# Patient Record
Sex: Male | Born: 1950
Health system: Southern US, Community
[De-identification: ages and names within clinical notes are randomized; demographics above are authoritative.]

## PROBLEM LIST (undated history)

## (undated) DIAGNOSIS — Z8719 Personal history of other diseases of the digestive system: Secondary | ICD-10-CM

## (undated) DIAGNOSIS — E785 Hyperlipidemia, unspecified: Secondary | ICD-10-CM

## (undated) DIAGNOSIS — Z87442 Personal history of urinary calculi: Secondary | ICD-10-CM

## (undated) DIAGNOSIS — J45909 Unspecified asthma, uncomplicated: Secondary | ICD-10-CM

## (undated) DIAGNOSIS — I10 Essential (primary) hypertension: Secondary | ICD-10-CM

## (undated) DIAGNOSIS — M549 Dorsalgia, unspecified: Secondary | ICD-10-CM

## (undated) DIAGNOSIS — K5790 Diverticulosis of intestine, part unspecified, without perforation or abscess without bleeding: Secondary | ICD-10-CM

## (undated) DIAGNOSIS — Z9109 Other allergy status, other than to drugs and biological substances: Secondary | ICD-10-CM

## (undated) HISTORY — PX: COLOSTOMY: SHX63

## (undated) HISTORY — PX: HERNIA REPAIR: SHX51

## (undated) HISTORY — PX: TUMOR REMOVAL: SHX12

## (undated) HISTORY — PX: SHOULDER ARTHROSCOPY W/ SUBACROMIAL DECOMPRESSION AND DISTAL CLAVICLE EXCISION: SHX2401

## (undated) HISTORY — PX: OTHER SURGICAL HISTORY: SHX169

## (undated) HISTORY — PX: COLONOSCOPY: SHX174

## (undated) HISTORY — PX: KNEE ARTHROSCOPY: SHX127

## (undated) HISTORY — PX: KIDNEY STONE SURGERY: SHX686

## (undated) HISTORY — PX: SHOULDER ARTHROSCOPY W/ ROTATOR CUFF REPAIR: SHX2400

---

## 1972-12-04 HISTORY — PX: ACHILLES TENDON REPAIR: SUR1153

## 2009-12-11 ENCOUNTER — Encounter: Admission: RE | Admit: 2009-12-11 | Discharge: 2009-12-11 | Payer: Self-pay | Admitting: Podiatry

## 2010-12-25 ENCOUNTER — Encounter: Payer: Self-pay | Admitting: Podiatry

## 2011-07-21 ENCOUNTER — Encounter (HOSPITAL_BASED_OUTPATIENT_CLINIC_OR_DEPARTMENT_OTHER)
Admission: RE | Admit: 2011-07-21 | Discharge: 2011-07-21 | Disposition: A | Discharge status: Home / Self Care | Payer: Medicare Other | Source: Ambulatory Visit | Attending: Orthopedic Surgery | Admitting: Orthopedic Surgery

## 2011-07-21 LAB — BASIC METABOLIC PANEL
BUN: 20 mg/dL (ref 6–23)
CO2: 26 mEq/L (ref 19–32)
Calcium: 9.3 mg/dL (ref 8.4–10.5)
Chloride: 100 mEq/L (ref 96–112)
Creatinine, Ser: 0.99 mg/dL (ref 0.50–1.35)
GFR calc non Af Amer: >60 mL/min (ref >60)
Glucose, Bld: 255 mg/dL — ABNORMAL HIGH (ref 70–99)
Potassium: 4.1 mEq/L (ref 3.5–5.1)
Sodium: 137 mEq/L (ref 135–145)

## 2011-07-25 ENCOUNTER — Ambulatory Visit (HOSPITAL_BASED_OUTPATIENT_CLINIC_OR_DEPARTMENT_OTHER)
Admission: RE | Admit: 2011-07-25 | Discharge: 2011-07-26 | Disposition: A | Discharge status: Home / Self Care | Payer: Medicare Other | Source: Ambulatory Visit | Attending: Orthopedic Surgery | Admitting: Orthopedic Surgery

## 2011-07-25 DIAGNOSIS — M9420 Chondromalacia, unspecified site: Secondary | ICD-10-CM | POA: Insufficient documentation

## 2011-07-25 DIAGNOSIS — M75120 Complete rotator cuff tear or rupture of unspecified shoulder, not specified as traumatic: Secondary | ICD-10-CM | POA: Insufficient documentation

## 2011-07-25 DIAGNOSIS — E669 Obesity, unspecified: Secondary | ICD-10-CM | POA: Insufficient documentation

## 2011-07-25 DIAGNOSIS — M11819 Other specified crystal arthropathies, unspecified shoulder: Secondary | ICD-10-CM | POA: Insufficient documentation

## 2011-07-25 DIAGNOSIS — I10 Essential (primary) hypertension: Secondary | ICD-10-CM | POA: Insufficient documentation

## 2011-07-25 DIAGNOSIS — M19019 Primary osteoarthritis, unspecified shoulder: Secondary | ICD-10-CM | POA: Insufficient documentation

## 2011-07-25 DIAGNOSIS — E119 Type 2 diabetes mellitus without complications: Secondary | ICD-10-CM | POA: Insufficient documentation

## 2011-07-25 DIAGNOSIS — M25819 Other specified joint disorders, unspecified shoulder: Secondary | ICD-10-CM | POA: Insufficient documentation

## 2011-07-25 DIAGNOSIS — S43439A Superior glenoid labrum lesion of unspecified shoulder, initial encounter: Secondary | ICD-10-CM | POA: Insufficient documentation

## 2011-07-25 DIAGNOSIS — Z01812 Encounter for preprocedural laboratory examination: Secondary | ICD-10-CM | POA: Insufficient documentation

## 2011-07-25 LAB — POCT HEMOGLOBIN-HEMACUE: Hemoglobin: 14.3 g/dL (ref 13.0–17.0)

## 2011-07-25 LAB — GLUCOSE, CAPILLARY: Glucose-Capillary: 169 mg/dL — ABNORMAL HIGH (ref 70–99)

## 2011-07-26 LAB — GLUCOSE, CAPILLARY: Glucose-Capillary: 189 mg/dL — ABNORMAL HIGH (ref 70–99)

## 2011-07-27 NOTE — Op Note (Signed)
NAMEDELANDO, SATTER                  ACCOUNT NO.:  [id number]  MEDICAL RECORD NO.:  [id number]  LOCATION:                                 FACILITY:  PHYSICIAN:  Katy Fitch. Yen Wandell, M.D.      DATE OF BIRTH:  DATE OF PROCEDURE:  07/25/2011 DATE OF DISCHARGE:                              OPERATIVE REPORT   PREOPERATIVE DIAGNOSES:  MRI-documented partial articular surface tear of supraspinatus rotator cuff tendon with degenerative biceps tendon changes and severe acromioclavicular arthropathy with likely chronic stage III impingement.  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  OPERATION: 1. Examination of right shoulder under anesthesia documenting a stable     glenohumeral joint. 2. Diagnostic arthroscopy right glenohumeral joint identifying     calcific tendinopathy, calcium pyrophosphate deposition disease     arthropathy, a type 3 superior labrum anterior posterior  lesion, a     full-thickness rotator cuff tear, and unstable biceps origin. 3. Arthroscopic debridement of labrum with biceps tenotomy followed by     open biceps tenodesis and extensive debridement of synovitis,     calcific tendinopathy, and calcific deposits within hyaline     cartilage of glenoid and labrum. 4. Arthroscopic subacromial decompression with partial coracoacromial     ligament release and medial acromioplasty. 5. Arthroscopic distal clavicle resection. 6. Open repair of supraspinatus and rotator interval for degenerative     calcific tendinopathy tear. 7. Open biceps tenodesis.  SURGEON:  Katy Fitch. Sadonna Kotara, MD  ASSISTANT:  Annye Rusk, PA-C  ANESTHESIA:  General by endotracheal technique supplemented by a right plexus  block.  SUPERVISING ANESTHESIOLOGIST:  Janetta Hora. 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  He remains very active physically exercising on a regular basis and performing martial arts.  He had progressive shoulder pain.  He was referred by his primary care physician, Dr. Luiz Iron for evaluation of shoulder pain.  Clinical examination revealed signs of significant AC arthropathy, stage III impingement, possible rotator cuff tear, and possible biceps tendinopathy.  He was referred for an MRI of the shoulder which documented an extreme PASTA lesion likely full-thickness, unfavorable AC anatomy, abnormal signal in the biceps tendon, and bursitis.  We recommended to Mr. Martelle to undergo diagnostic arthroscopy, appropriate joint debridement likely biceps tenotomy or tenodesis, subacromial decompression, distal clavicle resection, and repair of the rotator cuff as our findings dictated.  Preoperatively, he was seen by Dr. Luiz Iron for screening exam.  He was noted to be now experiencing type 2 diabetes with a hemoglobin A1c of 8.1.  His other lab work was within normal limits with exception of elevated SGOT and SGPT enzymes.  He is on Crestor.  Dr. Luiz Iron is planning to adjust his medications and begin metformin for his type 2 diabetes.  He was provided preoperative clearance by Dr. Luiz Iron for surgery at this time.  Questions were invited and  answered in detail.  In the holding area, he was interviewed by Dr. Gelene Mink who recommended an ultrasound-guided plexus block.  It was placed without complication. We will proceed with diagnostic arthroscopy and appropriate repairs.  PROCEDURE:  Chosen Geske was brought to room 6 at the [facility] and placed in supine position on the operating table.  Following the induction of general anesthesia by  endotracheal technique under Dr. Thornton Dales direct supervision, Mr. Lucena was carefully positioned in a beach-chair position with the aid of a torso and head holder designed for shoulder arthroscopy.  Examination of the right shoulder under anesthesia revealed no evidence of capsular instability. There was no evidence of adhesive capsulitis.  Ancef 2 g were administered as an IV prophylactic antibiotic per weight protocol followed by routine prep of the right upper extremity forequarter with DuraPrep and draping with impervious arthroscopy drapes.  After routine surgical time-out, the scope was placed into the glenohumeral joint through a standard posterior viewing portal. Diagnostic arthroscopy revealed intact hyaline articular cartilage surfaces on the humeral head and most of the glenoid.  There was calcium pyrophosphate deposition in the anterior glenoid at 3 and 4 o'clock, considerable involvement of the labrum, calcific infiltration of the supraspinatus and infraspinatus rotator cuff tendons, and a complex PASTA degenerative tear at the watershed zone extending to the rotator interval.  The long head of the biceps had an unstable origin due to a type 3 SLAP lesion with marked undermining and a positive peel back sign.  The labrum was unstable to 2:30 anteriorly and well posteriorly to approximately 10 o'clock.  At this point, we elected to proceed with a biceps tenotomy and ultimate open tenodesis with repair of the cuff. Extensive debridement of synovitis, calcific deposits, labral fragments, and hyaline cartilage fragments were accomplished with suction shaver. There were areas of loosening cartilage on the glenoid at approximately 11 and 10 o'clock superiorly and posteriorly.  The inferior recess was inspected and found to be normal.  The superior recess had a moderate degree of synovitis that was debrided.  After tenotomy of the biceps and debridement of the labrum to a  stable margin, hemostasis was achieved and the arthroscopic clip was removed from the glenohumeral joint.  We subsequently placed a scope into the subacromial space and performed an extensive bursectomy, removed the capsule of the [facility] joint, documented the osteophytes, leveled the acromion to a type 1 morphology, released part of the coracoacromial ligament obtaining hemostasis with bipolar cautery, resected the distal centimeter of clavicle arthroscopically, followed by suction shaver cleaning of all the debris.  After hemostasis was achieved, we then proceeded to an anterior middle third deltoid splitting incision.  We identified the cuff tear visually, identified the long head of the biceps, placed a grasping suture in the long head of the biceps, completed the biceps tenotomy with removal of the proximal stump.  We elevated the necrotic supraspinatus back to a portion of the infraspinatus with a power bur, decorticated the greater tuberosity, and lowered its profile approximately 3 mm, then placed a BioComposite vented corkscrew at the articular margin, placed a reverse mattress suture of fiber tape securing the supraspinatus, followed by placement of a pair of mattress sutures for inset anatomically at the articular margin and placing the rotator cuff in an anatomic footprint, tied the two medial footprint sutures to the BioComposite anchor.  Trimmed the tails of one suture, then used the grasping suture in the long head of the biceps to close the leaflets  of the tear and with the aid of a single swivel lock laterally, inset the fiber tape neutralizing the traction on the supraspinatus and creating compression with one of the tail set from the anchor.  A very low profile repair was achieved.  A hand rasp was used to contour the lateral margin of the acromion. Hemostasis was achieved with bipolar cautery followed by repair of the deltoid split.  The skin was repaired with subcutaneous  suture of 0 and 2-0 Vicryl and intradermal 3-0 Prolene.  The scope was replaced in the joint and all clot and remnants of tendon were debrided.  The final repair was documented with digital camera images.  There were no apparent complications.  Mr. Deines tolerated the surgery and anesthesia well.  He was transferred to recovery room in a sling with stable vital signs.  We will admit him to the recovery care center for observation of his vital signs, glucose, and appropriate analgesic medication.  We will provide Ancef 1 g IV x2 more doses q. 8 hours.     Katy Fitch Markham Dumlao, M.D.     RVS/MEDQ  D:  07/25/2011  T:  07/25/2011  Job:  [id number]  cc:   Dennis Bast, MD  Electronically Signed by Josephine Igo M.D. on 07/27/2011 08:19:01 AM

## 2011-11-15 ENCOUNTER — Other Ambulatory Visit: Payer: Self-pay | Admitting: Orthopedic Surgery

## 2011-11-17 ENCOUNTER — Encounter (HOSPITAL_BASED_OUTPATIENT_CLINIC_OR_DEPARTMENT_OTHER)
Admission: RE | Disposition: A | Discharge status: Home / Self Care | Payer: Self-pay | Source: Ambulatory Visit | Attending: Orthopedic Surgery

## 2011-11-17 ENCOUNTER — Ambulatory Visit (HOSPITAL_BASED_OUTPATIENT_CLINIC_OR_DEPARTMENT_OTHER)
Admission: RE | Admit: 2011-11-17 | Discharge: 2011-11-17 | Disposition: A | Discharge status: Home / Self Care | Payer: Medicare Other | Source: Ambulatory Visit | Attending: Orthopedic Surgery | Admitting: Orthopedic Surgery

## 2011-11-17 DIAGNOSIS — E119 Type 2 diabetes mellitus without complications: Secondary | ICD-10-CM | POA: Insufficient documentation

## 2011-11-17 DIAGNOSIS — M6530 Trigger finger, unspecified finger: Secondary | ICD-10-CM | POA: Insufficient documentation

## 2011-11-17 HISTORY — PX: TRIGGER FINGER RELEASE: SHX641

## 2011-11-17 SURGERY — RELEASE, A1 PULLEY, FOR TRIGGER FINGER
Anesthesia: LOCAL | Site: Hand | Laterality: Left | Wound class: Clean

## 2011-11-17 MED ORDER — LIDOCAINE HCL (PF) 2 % IJ SOLN
INTRAMUSCULAR | Status: DC | PRN
  Administered 2011-11-17: 5.5 mL

## 2011-11-17 MED ORDER — TRAMADOL HCL 50 MG PO TABS: ORAL_TABLET | ORAL | Status: DC

## 2011-11-17 MED ORDER — TRAMADOL HCL 50 MG PO TABS: ORAL_TABLET | ORAL | Status: AC

## 2011-11-17 SURGICAL SUPPLY — 32 items
BLADE SURG 15 STRL LF DISP TIS (BLADE) ×1 IMPLANT
BLADE SURG 15 STRL SS (BLADE) ×1
BNDG ELASTIC 2 VLCR STRL LF (GAUZE/BANDAGES/DRESSINGS) ×2 IMPLANT
BNDG ESMARK 4X9 LF (GAUZE/BANDAGES/DRESSINGS) ×2 IMPLANT
BRUSH SCRUB EZ PLAIN DRY (MISCELLANEOUS) ×2 IMPLANT
CLOTH BEACON ORANGE TIMEOUT ST (SAFETY) ×2 IMPLANT
CORDS BIPOLAR (ELECTRODE) IMPLANT
COVER MAYO STAND STRL (DRAPES) ×2 IMPLANT
COVER TABLE BACK 60X90 (DRAPES) ×2 IMPLANT
CUFF TOURNIQUET SINGLE 18IN (TOURNIQUET CUFF) ×2 IMPLANT
DECANTER SPIKE VIAL GLASS SM (MISCELLANEOUS) IMPLANT
DRAPE EXTREMITY T 121X128X90 (DRAPE) ×2 IMPLANT
DRAPE SURG 17X23 STRL (DRAPES) ×2 IMPLANT
GAUZE SPONGE 4X4 12PLY STRL LF (GAUZE/BANDAGES/DRESSINGS) ×2 IMPLANT
GAUZE XEROFORM 1X8 LF (GAUZE/BANDAGES/DRESSINGS) ×2 IMPLANT
GLOVE BIO SURGEON STRL SZ 6.5 (GLOVE) ×2 IMPLANT
GLOVE BIO SURGEON STRL SZ7.5 (GLOVE) ×2 IMPLANT
GLOVE BIOGEL M STRL SZ7.5 (GLOVE) ×2 IMPLANT
GLOVE ORTHO TXT STRL SZ7.5 (GLOVE) ×2 IMPLANT
GOWN PREVENTION PLUS XLARGE (GOWN DISPOSABLE) ×2 IMPLANT
GOWN STRL REIN XL XLG (GOWN DISPOSABLE) ×4 IMPLANT
NEEDLE 27GAX1X1/2 (NEEDLE) ×2 IMPLANT
PACK BASIN DAY SURGERY FS (CUSTOM PROCEDURE TRAY) ×2 IMPLANT
PAD CAST 4YDX4 CTTN HI CHSV (CAST SUPPLIES) ×1 IMPLANT
PADDING CAST COTTON 4X4 STRL (CAST SUPPLIES) ×1
SPONGE GAUZE 4X4 12PLY (GAUZE/BANDAGES/DRESSINGS) IMPLANT
STOCKINETTE 4X48 STRL (DRAPES) ×2 IMPLANT
SUT ETHILON 5 0 PS 2 18 (SUTURE) ×2 IMPLANT
SYR CONTROL 10ML LL (SYRINGE) ×2 IMPLANT
TOWEL OR 17X24 6PK STRL BLUE (TOWEL DISPOSABLE) ×2 IMPLANT
UNDERPAD 30X30 INCONTINENT (UNDERPADS AND DIAPERS) ×2 IMPLANT
WATER STERILE IRR 1000ML POUR (IV SOLUTION) IMPLANT

## 2011-11-17 NOTE — Brief Op Note (Signed)
11/17/2011  10:32 AM  PATIENT:  [name]  60 y.o. male  PRE-OPERATIVE DIAGNOSIS:  TRIGGER FINGER LEFT LONG  POST-OPERATIVE DIAGNOSIS:  LEFT LONG TRIGGER FINGER  PROCEDURE:  Procedure(s): RELEASE TRIGGER FINGER A-1 PULLEY LEFT LONG   SURGEON:  Surgeon(s): Wyn Forster., MD  PHYSICIAN ASSISTANT:   ASSISTANTS: Mallory Shirk.A-C   ANESTHESIA:   local  EBL:     BLOOD ADMINISTERED:none  DRAINS: none   LOCAL MEDICATIONS USED:  LIDOCAINE 5.5 CC 2%  SPECIMEN:  No Specimen  DISPOSITION OF SPECIMEN:  N/A  COUNTS:  YES  TOURNIQUET:   Total Tourniquet Time Documented: Upper Arm (Left) - 7 minutes  DICTATION: .Other Dictation: Dictation Number [id number]  PLAN OF CARE: Discharge to home after PACU  PATIENT DISPOSITION:  PACU - hemodynamically stable.

## 2011-11-17 NOTE — H&P (Signed)
  MEDICARE   PATIENT:   [name]. [name]     SEEN BY:   Katy Fitch. Naaman Plummer., M.D.   OFFICE VISIT:   12.12.12                  DOB:   [birth year]  [name] returns and is making progress with his right shoulder following reconstruction of his rotator cuff 07/25/11.  [name] main problem today is locking of his left long finger. He locks in flexion.  He is tender over the A-1 pulley which is swollen.  I have advised Owen to proceed with release of the A-1 pulley under local anesthesia this month.  The surgery and aftercare were described in detail.  He will continue to work with Celyn regarding his shoulder rehab.  Questions were invited and answered in detail.     _______________________ Katy Fitch. Giordan Fordham, Montez Hageman., MD/cmf   H&P documentation: 11/17/2011  -History and Physical Reviewed  -Patient has been re-examined  -No change in the plan of care  Wyn Forster, MD

## 2011-11-17 NOTE — Progress Notes (Signed)
Patient had an unspecified skin reaction after his shoulder surgery which lasted 2 months.  Patient feels this was from the anesthesia.  After discussion with patient, I found that reaction was more localized to torso, mostly on the operative side.  Plan to list CHG as a questionable reaction and will not use on this visit pre operatively.   Discussed this with patient.

## 2011-11-17 NOTE — Op Note (Signed)
Op note dictated: 11/17/11

## 2011-11-17 NOTE — Op Note (Addendum)
NAMERALIEGH, SCOBIE NO.:  [id number]  MEDICAL RECORD NO.:  [id number]  LOCATION:                                 FACILITY:  PHYSICIAN:  Katy Fitch. Collyn Ribas, M.D.      DATE OF BIRTH:  DATE OF PROCEDURE:  11/17/2011 DATE OF DISCHARGE:                              OPERATIVE REPORT   PREOPERATIVE DIAGNOSIS:  Locking left long finger, with background type 2 diabetes.  POSTOPERATIVE DIAGNOSIS:  Locking left long finger, with background type 2 diabetes.  OPERATION:  Release of left long finger A1 pulley under local anesthesia.  This is a minor operating room procedure.  OPERATING SURGEON:  Katy Fitch. Andreas Sobolewski, MD  ASSISTANT:  Annye Rusk, PA-C  ANESTHESIA:  Lidocaine 2% field block and flexor sheath block, left long finger, 5.5 mL.  This was provided by Dr. Teressa Senter preoperatively.  INDICATIONS:  [name] is a 60 year old gentleman, recently treated for a rotator cuff tear.  During his rehabilitation, we noted that he had a left long trigger finger.  [name] was diagnosed to have type 2 diabetes in the perioperative period.  He has worked very hard on managing his diabetes. He has lost  over 35 pounds since diagnosis.  He continues to rebuild his shoulder.  We recommended that it would be in his best interest to proceed with release of the A1 pulley.  We are not providing steroid injection so as not to elevate his blood glucose.  After informed consent, he is brought to the operating room at this time.  PROCEDURE:  [name] was brought to room 2 of the [facility] and placed in supine position upon the operating table.  After informed consent and Betadine prep, 5.5 mL of 2% lidocaine were infiltrated into the path of intended incision and the flexor sheath of the left long finger.  After 10 minutes, excellent anesthesia was achieved.  The left hand and arm were then prepped with Betadine soap and solution and sterilely draped.  A pneumatic  tourniquet was applied to the proximal left brachium.  Following exsanguination of the left arm with Esmarch bandage, arterial tourniquet was inflated to 220 mmHg.  Procedure commenced with a 1 cm oblique incision directly over the palpably thickened A1 pulley.  Subcutaneous tissues were carefully divided taking care to identify the pulley and retract the neurovascular structures. The pulley was split with scalpel and scissors followed by elevation of the superficialis tendon.  Limited synovectomy was accomplished, removing fibrotic tenosynovium.  There were no masses or predicaments noted.  The wound was then repaired with mattress suture of 5-0 nylon x2.  A compressive dressing was applied with Xeroflo, sterile gauze, and an Ace wrap.  For aftercare, [name] was provided a prescription for Percocet 5 mg 1 p.o. q.4-6 h. p.r.n. pain, 20 tablets without refill.     Katy Fitch Scotti Motter, M.D.     RVS/MEDQ  D:  11/17/2011  T:  11/17/2011  Job:  [id number]

## 2011-11-20 ENCOUNTER — Encounter (HOSPITAL_BASED_OUTPATIENT_CLINIC_OR_DEPARTMENT_OTHER): Payer: Self-pay

## 2011-11-20 ENCOUNTER — Encounter (HOSPITAL_BASED_OUTPATIENT_CLINIC_OR_DEPARTMENT_OTHER): Payer: Self-pay | Admitting: Orthopedic Surgery

## 2012-03-25 ENCOUNTER — Encounter (HOSPITAL_BASED_OUTPATIENT_CLINIC_OR_DEPARTMENT_OTHER): Payer: Self-pay | Admitting: *Deleted

## 2012-03-25 ENCOUNTER — Other Ambulatory Visit: Payer: Self-pay | Admitting: Orthopedic Surgery

## 2012-03-25 NOTE — Progress Notes (Signed)
To come in for bmet 

## 2012-03-26 ENCOUNTER — Encounter (HOSPITAL_BASED_OUTPATIENT_CLINIC_OR_DEPARTMENT_OTHER)
Admission: RE | Admit: 2012-03-26 | Discharge: 2012-03-26 | Disposition: A | Discharge status: Home / Self Care | Payer: Medicare Other | Source: Ambulatory Visit | Attending: Orthopedic Surgery | Admitting: Orthopedic Surgery

## 2012-03-26 LAB — BASIC METABOLIC PANEL
CO2: 28 mEq/L (ref 19–32)
Chloride: 101 mEq/L (ref 96–112)
Glucose, Bld: 123 mg/dL — ABNORMAL HIGH (ref 70–99)
Potassium: 4 mEq/L (ref 3.5–5.1)

## 2012-03-27 MED ORDER — CHLORHEXIDINE GLUCONATE 4 % EX LIQD: 60.0000 mL | Freq: Once | CUTANEOUS | Status: DC

## 2012-03-27 NOTE — H&P (Signed)
[name] is an 61 y.o. male.   Chief Complaint: Complaining of chronic and left shoulder pain and decreased range of motion HPI: Patient is a 61 year old right-hand-dominant male who had previously undergone right shoulder arthroscopy in August of 2012 with rotator cuff repair. During his rehabilitation program for his right shoulder. He noted increasing pain in the left shoulder. Her he was concerned that he might be developing a similar rotator cuff issue in the left shoulder. Patient was sent for an MRI. This was performed on 03/18/2012. This revealed supraspinatus and infraspinatus tendinosis and moderate a.c. joint arthrosis. It was felt that the patient would need undergo left shoulder arthroscopy with subacromial decompression and distal clavicle resection.  Past Medical History  Diagnosis Date  . Environmental allergies   . Arthritis   . Diabetes mellitus   . Hypertension   . Hyperlipemia   . Diverticular disease   . Back pain     Past Surgical History  Procedure Date  . Trigger finger release 11/17/2011    Procedure: RELEASE TRIGGER FINGER/A-1 PULLEY;  Surgeon: Wyn Forster., MD;  Location: [facility];  Service: Orthopedics;  Laterality: Left;  RELEASE LEFT LONG TRIGGER FINGER  . Achilles tendon repair 1974    left  . Shoulder arthroscopy 8/12    right  . Tumor removal     rt neck/chin  . Kidney stone surgery   . Trigger finger release 12/12    lt long finger  . Knee arthroscopy     rt  . Colonoscopy     No family history on file. Social History:  reports that he quit smoking about 2 years ago. He does not have any smokeless tobacco history on file. He reports that he drinks alcohol. He reports that he does not use illicit drugs.  Allergies:  Allergies  Allergen Reactions  . Hibiclens (Chlorhexidine Gluconate) Rash    Pt. Had rash on torso after last surgery, mostly on surgical side.  Am listing CHG as potential allergy.  . Flagyl  (Metronidazole Hcl) Hives    No prescriptions prior to admission    Results for orders placed during the hospital encounter of 03/28/12 (from the past 48 hour(s))  BASIC METABOLIC PANEL     Status: Abnormal   Collection Time   03/26/12  9:00 AM      Component Value Range Comment   Sodium 139  135 - 145 (mEq/L)    Potassium 4.0  3.5 - 5.1 (mEq/L)    Chloride 101  96 - 112 (mEq/L)    CO2 28  19 - 32 (mEq/L)    Glucose, Bld 123 (*) 70 - 99 (mg/dL)    BUN 23  6 - 23 (mg/dL)    Creatinine, Ser 4.09  0.50 - 1.35 (mg/dL)    Calcium 81.1  8.4 - 10.5 (mg/dL)    GFR calc non Af Amer 63 (*) >90 (mL/min)    GFR calc Af Amer 73 (*) >90 (mL/min)     No results found.   Pertinent items are noted in HPI.  Height 5\' 6"  (1.676 m), weight 86.183 kg (190 lb).  General appearance: alert Head: Normocephalic, without obvious abnormality Neck: supple, symmetrical, trachea midline Resp: clear to auscultation bilaterally Cardio: regular rate and rhythm, S1, S2 normal, no murmur, click, rub or gallop GI: normal findings: bowel sounds normal Extremities: Examination of his left shoulder reveal positive impingement signs with pain with forward flexion and a painful arc on abduction from  90-140. He also has pain with full internal rotation behind his back and at the limits of his external rotation. Plain x-rays revealed moderate a.c. arthrosis with a type 3 acromion. Pulses: 2+ and symmetric Skin: normal Neurologic: Grossly normal   Assessment/Plan Impression: Left shoulder impingement with A/C arthrosis and RC tendinosis  Plan To the OR for left shoulder arthroscopy and SAD/DCR with RC debridement. The procedure, risks and post-op course were discussed at length and he was in agreement with the plan.  DASNOIT,Denney Shein J 03/27/2012, 4:18 PM    H&P documentation: 03/28/2012  -History and Physical Reviewed  -Patient has been re-examined  -No change in the plan of care  Wyn Forster,  MD

## 2012-03-28 ENCOUNTER — Encounter (HOSPITAL_BASED_OUTPATIENT_CLINIC_OR_DEPARTMENT_OTHER): Payer: Self-pay | Admitting: Anesthesiology

## 2012-03-28 ENCOUNTER — Ambulatory Visit (HOSPITAL_BASED_OUTPATIENT_CLINIC_OR_DEPARTMENT_OTHER): Payer: Medicare Other | Admitting: Anesthesiology

## 2012-03-28 ENCOUNTER — Encounter (HOSPITAL_BASED_OUTPATIENT_CLINIC_OR_DEPARTMENT_OTHER): Payer: Self-pay | Admitting: *Deleted

## 2012-03-28 ENCOUNTER — Ambulatory Visit (HOSPITAL_BASED_OUTPATIENT_CLINIC_OR_DEPARTMENT_OTHER)
Admission: RE | Admit: 2012-03-28 | Discharge: 2012-03-28 | Disposition: A | Discharge status: Home / Self Care | Payer: Medicare Other | Source: Ambulatory Visit | Attending: Orthopedic Surgery | Admitting: Orthopedic Surgery

## 2012-03-28 ENCOUNTER — Encounter (HOSPITAL_BASED_OUTPATIENT_CLINIC_OR_DEPARTMENT_OTHER)
Admission: RE | Disposition: A | Discharge status: Home / Self Care | Payer: Self-pay | Source: Ambulatory Visit | Attending: Orthopedic Surgery

## 2012-03-28 DIAGNOSIS — E785 Hyperlipidemia, unspecified: Secondary | ICD-10-CM | POA: Insufficient documentation

## 2012-03-28 DIAGNOSIS — M24119 Other articular cartilage disorders, unspecified shoulder: Secondary | ICD-10-CM | POA: Insufficient documentation

## 2012-03-28 DIAGNOSIS — M67919 Unspecified disorder of synovium and tendon, unspecified shoulder: Secondary | ICD-10-CM | POA: Insufficient documentation

## 2012-03-28 DIAGNOSIS — Z87891 Personal history of nicotine dependence: Secondary | ICD-10-CM | POA: Insufficient documentation

## 2012-03-28 DIAGNOSIS — E119 Type 2 diabetes mellitus without complications: Secondary | ICD-10-CM | POA: Insufficient documentation

## 2012-03-28 DIAGNOSIS — I10 Essential (primary) hypertension: Secondary | ICD-10-CM | POA: Insufficient documentation

## 2012-03-28 DIAGNOSIS — M11819 Other specified crystal arthropathies, unspecified shoulder: Secondary | ICD-10-CM | POA: Insufficient documentation

## 2012-03-28 DIAGNOSIS — M719 Bursopathy, unspecified: Secondary | ICD-10-CM | POA: Insufficient documentation

## 2012-03-28 HISTORY — DX: Other allergy status, other than to drugs and biological substances: Z91.09

## 2012-03-28 HISTORY — DX: Essential (primary) hypertension: I10

## 2012-03-28 HISTORY — DX: Dorsalgia, unspecified: M54.9

## 2012-03-28 HISTORY — DX: Diverticulosis of intestine, part unspecified, without perforation or abscess without bleeding: K57.90

## 2012-03-28 HISTORY — DX: Hyperlipidemia, unspecified: E78.5

## 2012-03-28 LAB — GLUCOSE, CAPILLARY: Glucose-Capillary: 97 mg/dL (ref 70–99)

## 2012-03-28 SURGERY — SHOULDER ARTHROSCOPY WITH DISTAL CLAVICLE RESECTION
Anesthesia: Regional | Site: Shoulder | Laterality: Left | Wound class: Clean

## 2012-03-28 MED ORDER — IBUPROFEN 600 MG PO TABS: 600.0000 mg | ORAL_TABLET | Freq: Four times a day (QID) | ORAL | Status: AC | PRN

## 2012-03-28 MED ORDER — LACTATED RINGERS IV SOLN
INTRAVENOUS | Status: DC
  Administered 2012-03-28 (×2): via INTRAVENOUS

## 2012-03-28 MED ORDER — HYDROMORPHONE HCL 4 MG PO TABS
4.0000 mg | ORAL_TABLET | Freq: Once | ORAL | Status: AC | PRN
  Administered 2012-03-28: 4 mg via ORAL

## 2012-03-28 MED ORDER — FENTANYL CITRATE 0.05 MG/ML IJ SOLN
50.0000 ug | INTRAMUSCULAR | Status: DC | PRN
  Administered 2012-03-28: 100 ug via INTRAVENOUS

## 2012-03-28 MED ORDER — POVIDONE-IODINE 7.5 % EX SOLN: Freq: Once | CUTANEOUS | Status: DC

## 2012-03-28 MED ORDER — LIDOCAINE HCL (CARDIAC) 20 MG/ML IV SOLN
INTRAVENOUS | Status: DC | PRN
  Administered 2012-03-28: 75 mg via INTRAVENOUS

## 2012-03-28 MED ORDER — DEXAMETHASONE SODIUM PHOSPHATE 4 MG/ML IJ SOLN
INTRAMUSCULAR | Status: DC | PRN
  Administered 2012-03-28: 10 mg via INTRAVENOUS

## 2012-03-28 MED ORDER — CEFAZOLIN SODIUM 1-5 GM-% IV SOLN
INTRAVENOUS | Status: DC | PRN
  Administered 2012-03-28: 2 g via INTRAVENOUS

## 2012-03-28 MED ORDER — HYDROMORPHONE HCL 2 MG PO TABS: ORAL_TABLET | ORAL | Status: AC

## 2012-03-28 MED ORDER — MIDAZOLAM HCL 2 MG/2ML IJ SOLN
1.0000 mg | INTRAMUSCULAR | Status: DC | PRN
  Administered 2012-03-28: 2 mg via INTRAVENOUS

## 2012-03-28 MED ORDER — ONDANSETRON HCL 4 MG/2ML IJ SOLN
INTRAMUSCULAR | Status: DC | PRN
  Administered 2012-03-28: 4 mg via INTRAVENOUS

## 2012-03-28 MED ORDER — ROPIVACAINE HCL 5 MG/ML IJ SOLN
INTRAMUSCULAR | Status: DC | PRN
  Administered 2012-03-28: 30 mL via EPIDURAL

## 2012-03-28 MED ORDER — BUPIVACAINE-EPINEPHRINE PF 0.5-1:200000 % IJ SOLN: INTRAMUSCULAR | Status: DC | PRN

## 2012-03-28 MED ORDER — SUCCINYLCHOLINE CHLORIDE 20 MG/ML IJ SOLN
INTRAMUSCULAR | Status: DC | PRN
  Administered 2012-03-28: 100 mg via INTRAVENOUS

## 2012-03-28 MED ORDER — PROPOFOL 10 MG/ML IV EMUL
INTRAVENOUS | Status: DC | PRN
  Administered 2012-03-28: 200 mg via INTRAVENOUS

## 2012-03-28 MED ORDER — SODIUM CHLORIDE 0.9 % IR SOLN
Status: DC | PRN
  Administered 2012-03-28: 12000 mL

## 2012-03-28 MED ORDER — DOXYCYCLINE HYCLATE 100 MG PO TABS: 100.0000 mg | ORAL_TABLET | Freq: Two times a day (BID) | ORAL | Status: AC

## 2012-03-28 MED ORDER — VANCOMYCIN HCL IN DEXTROSE 1-5 GM/200ML-% IV SOLN: 1000.0000 mg | INTRAVENOUS | Status: DC

## 2012-03-28 SURGICAL SUPPLY — 80 items
BANDAGE ADHESIVE 1X3 (GAUZE/BANDAGES/DRESSINGS) IMPLANT
BLADE AVERAGE 25X9 (BLADE) IMPLANT
BLADE CUTTER MENIS 5.5 (BLADE) ×2 IMPLANT
BLADE SURG 15 STRL LF DISP TIS (BLADE) ×2 IMPLANT
BLADE SURG 15 STRL SS (BLADE) ×2
BUR EGG 3PK/BX (BURR) IMPLANT
BUR OVAL 6.0 (BURR) ×2 IMPLANT
CANISTER OMNI JUG 16 LITER (MISCELLANEOUS) ×2 IMPLANT
CANISTER SUCTION 2500CC (MISCELLANEOUS) ×2 IMPLANT
CANNULA 5.75X7 CRYSTAL CLEAR (CANNULA) IMPLANT
CANNULA SHOULDER 7CM (CANNULA) IMPLANT
CANNULA TWIST IN 8.25X7CM (CANNULA) IMPLANT
CLEANER CAUTERY TIP 5X5 PAD (MISCELLANEOUS) IMPLANT
CLOTH BEACON ORANGE TIMEOUT ST (SAFETY) ×2 IMPLANT
CUTTER MENISCUS  4.2MM (BLADE) ×1
CUTTER MENISCUS 4.2MM (BLADE) ×1 IMPLANT
DECANTER SPIKE VIAL GLASS SM (MISCELLANEOUS) IMPLANT
DRAPE INCISE IOBAN 66X45 STRL (DRAPES) ×2 IMPLANT
DRAPE STERI 35X30 U-POUCH (DRAPES) ×2 IMPLANT
DRAPE SURG 17X23 STRL (DRAPES) IMPLANT
DRAPE U-SHAPE 47X51 STRL (DRAPES) ×2 IMPLANT
DRAPE U-SHAPE 76X120 STRL (DRAPES) ×4 IMPLANT
DRSG PAD ABDOMINAL 8X10 ST (GAUZE/BANDAGES/DRESSINGS) ×2 IMPLANT
DURAPREP 26ML APPLICATOR (WOUND CARE) ×2 IMPLANT
ELECT REM PT RETURN 9FT ADLT (ELECTROSURGICAL)
ELECTRODE REM PT RTRN 9FT ADLT (ELECTROSURGICAL) IMPLANT
GLOVE BIO SURGEON STRL SZ 6.5 (GLOVE) ×2 IMPLANT
GLOVE BIOGEL M STRL SZ7.5 (GLOVE) ×2 IMPLANT
GLOVE BIOGEL PI IND STRL 7.0 (GLOVE) ×1 IMPLANT
GLOVE BIOGEL PI IND STRL 8 (GLOVE) ×2 IMPLANT
GLOVE BIOGEL PI INDICATOR 7.0 (GLOVE) ×1
GLOVE BIOGEL PI INDICATOR 8 (GLOVE) ×2
GLOVE ORTHO TXT STRL SZ7.5 (GLOVE) ×2 IMPLANT
GOWN BRE IMP PREV XXLGXLNG (GOWN DISPOSABLE) ×2 IMPLANT
GOWN PREVENTION PLUS XLARGE (GOWN DISPOSABLE) ×2 IMPLANT
GOWN STRL REIN 2XL XLG LVL4 (GOWN DISPOSABLE) ×2 IMPLANT
NDL SUT 6 .5 CRC .975X.05 MAYO (NEEDLE) IMPLANT
NEEDLE MAYO TAPER (NEEDLE)
NEEDLE MINI RC 24MM (NEEDLE) IMPLANT
NEEDLE SCORPION (NEEDLE) IMPLANT
PACK ARTHROSCOPY DSU (CUSTOM PROCEDURE TRAY) ×2 IMPLANT
PACK BASIN DAY SURGERY FS (CUSTOM PROCEDURE TRAY) ×2 IMPLANT
PAD CLEANER CAUTERY TIP 5X5 (MISCELLANEOUS)
PASSER SUT SWANSON 36MM LOOP (INSTRUMENTS) IMPLANT
PENCIL BUTTON HOLSTER BLD 10FT (ELECTRODE) IMPLANT
SLEEVE SCD COMPRESS KNEE MED (MISCELLANEOUS) ×2 IMPLANT
SLING ARM FOAM STRAP LRG (SOFTGOODS) IMPLANT
SLING ARM FOAM STRAP MED (SOFTGOODS) IMPLANT
SLING ARM FOAM STRAP XLG (SOFTGOODS) ×2 IMPLANT
SPONGE GAUZE 4X4 12PLY (GAUZE/BANDAGES/DRESSINGS) ×2 IMPLANT
SPONGE LAP 4X18 X RAY DECT (DISPOSABLE) ×2 IMPLANT
STRIP CLOSURE SKIN 1/2X4 (GAUZE/BANDAGES/DRESSINGS) ×2 IMPLANT
SUCTION FRAZIER TIP 10 FR DISP (SUCTIONS) IMPLANT
SUT ETHIBOND 2 OS 4 DA (SUTURE) IMPLANT
SUT ETHILON 4 0 PS 2 18 (SUTURE) IMPLANT
SUT FIBERWIRE #2 38 T-5 BLUE (SUTURE)
SUT FIBERWIRE 3-0 18 TAPR NDL (SUTURE)
SUT PROLENE 1 CT (SUTURE) IMPLANT
SUT PROLENE 3 0 PS 2 (SUTURE) ×2 IMPLANT
SUT TIGER TAPE 7 IN WHITE (SUTURE) IMPLANT
SUT VIC AB 0 CT1 27 (SUTURE)
SUT VIC AB 0 CT1 27XBRD ANBCTR (SUTURE) IMPLANT
SUT VIC AB 0 SH 27 (SUTURE) IMPLANT
SUT VIC AB 2-0 SH 27 (SUTURE)
SUT VIC AB 2-0 SH 27XBRD (SUTURE) IMPLANT
SUT VIC AB 3-0 SH 27 (SUTURE)
SUT VIC AB 3-0 SH 27X BRD (SUTURE) IMPLANT
SUT VIC AB 3-0 X1 27 (SUTURE) IMPLANT
SUTURE FIBERWR #2 38 T-5 BLUE (SUTURE) IMPLANT
SUTURE FIBERWR 3-0 18 TAPR NDL (SUTURE) IMPLANT
SYR 3ML 23GX1 SAFETY (SYRINGE) IMPLANT
SYR BULB 3OZ (MISCELLANEOUS) IMPLANT
TAPE FIBER 2MM 7IN #2 BLUE (SUTURE) IMPLANT
TAPE PAPER 3X10 WHT MICROPORE (GAUZE/BANDAGES/DRESSINGS) ×2 IMPLANT
TOWEL OR 17X24 6PK STRL BLUE (TOWEL DISPOSABLE) ×2 IMPLANT
TUBE CONNECTING 20X1/4 (TUBING) ×4 IMPLANT
TUBING ARTHROSCOPY IRRIG 16FT (MISCELLANEOUS) ×2 IMPLANT
WAND STAR VAC 90 (SURGICAL WAND) ×2 IMPLANT
WATER STERILE IRR 1000ML POUR (IV SOLUTION) ×2 IMPLANT
YANKAUER SUCT BULB TIP NO VENT (SUCTIONS) IMPLANT

## 2012-03-28 NOTE — Op Note (Signed)
NAME:  [name], [name]                       ACCOUNT NO.:  MEDICAL RECORD NO.:  [id number]  LOCATION:                                 FACILITY:  PHYSICIAN:  Katy Fitch. Raheim Beutler, M.D.      DATE OF BIRTH:  DATE OF PROCEDURE:  03/28/2012 DATE OF DISCHARGE:                              OPERATIVE REPORT   PREOPERATIVE DIAGNOSES:  Clinical examination revealing possible partial- thickness rotator cuff tear and chronic stage II or III impingement of left shoulder with hypertrophic acromioclavicular joint osteoarthritis and degenerative tendon signal noted on preoperative MRI.  POSTOPERATIVE DIAGNOSES:  Extreme chondrocalcinosis of glenohumeral joint with degenerative labral changes and calcific tendinopathy/chondrocalcinosis of glenohumeral articular surfaces and approximately 40% PASTA articular side tendon avulsion/degenerative tear of supraspinatus rotator cuff tendon.  OPERATION: 1. Diagnostic arthroscopy, left shoulder. 2. Arthroscopic debridement of labrum, glenohumeral articular surfaces     and supraspinatus rotator cuff tendon. 3. Arthroscopic subacromial decompression with bursectomy,     coracoacromial ligament relaxation and acromioplasty. 4. Arthroscopic resection of the distal clavicle.  OPERATING SURGEON:  Katy Fitch. Abie Killian, M.D.  ASSISTANT:  Marveen Reeks Dasnoit, PA-C  ANESTHESIA:  General endotracheal supplemented by a left ropivacaine plexus block.  SUPERVISING ANESTHESIOLOGIST:  Achille Rich, M.D.  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 He has been under our care for bilateral shoulder symptoms for the past year.  On the right, he underwent a right rotator cuff tear, complicated by rather extreme adhesive capsulitis.  During his care, we identified that he had previously unrecognized type 2 diabetes that was not well controlled.  We have established relationship  with Mr. Lamping and the medical doctor who has helped him control his type 2 diabetes, weight loss and use of metformin.  The extensive physical therapy, his right shoulder adhesive capsulitis symptoms have gradually cleared.  Mr. Wilms continued to have pain in the left shoulder; therefore, he requested that we image the left shoulder with an MRI.  This was accomplished and revealed significant tendinopathy of the supraspinatus rotator cuff tendon and very unfavorable AC anatomy.  We advised [name] to proceed with subacromial decompression and distal clavicle resection at this time to try to preserve the integrity of his cuff.  At the age of 61, [name] is at risk with his diabetes.  He developed a ischemic/degenerative tendinopathy that may not respond to simple decompression.  He is also at risk for second adhesive capsulitis bout as his diabetes is under better control, but not ideal control.  After informed consent, he was brought to the operating at this time.  PROCEDURE:  Tramain Gershman was interviewed by Dr. Chaney Malling in the holding area preoperatively.  After detailed anesthesia informed consent, a ropivacaine interscalene block was placed without complication using ultrasound control.  Excellent anesthesia of the left forequarter and upper extremity was achieved.  [name] was then transferred to room 2, where under Dr. Chaney Malling strict supervision, general endotracheal anesthesia was induced.  He is carefully positioned in the beach chair with aid of a torso and head holder designed for  shoulder arthroscopy.  Following a routine surgical time-out, examination of shoulder under anesthesia revealed the shoulder was stable to all planes of testing.  The scope was introduced through the standard posterior viewing portal with blunt technique. Diagnostic arthroscopy revealed abundant chondrocalcinosis of the glenoid and humeral head with extensive infiltration of chondrocalcinosis  into the hyaline articular cartilage and into the degenerative labrum.  The rotator cuff had a 40% pasta-type supraspinatus degenerative tear with extensive calcification of the tendon.  An anterior port was created under direct vision and a 4.2 mm suction shaver was used to perform a thorough debridement of the glenohumeral joint, labrum tendon and decortication of the greater tuberosity deep to the pasta tear.  Given Darral' past history with this severe adhesive capsulitis and given the fact that his rotator cuff tear was not 50%, we elected not to proceed with repair of the rotator cuff at this time.  After thorough joint debridement, the scope was removed from the glenohumeral joint and placed in subacromial space.  Florid bursitis was noted.  With a combination of shaving with a 5.5 mm suction shaver and use of electrocautery, the bursa was cleared, followed by relaxation of coracoacromial ligament.  A very degenerative AC joint was documented in the digital camera.  This was resected to a type 1 morphology with a suction shaver, followed by resection of the distal cm clavicle. Hemostasis was achieved with bipolar cautery.  Extensive calcification of the bursa and ligaments of the [facility] joint was noted.  All of the dystrophic calcification was removed with the suction shaver.  Hemostasis was achieved in the subacromial space with bipolar cautery. We identified the rotator cuff did not have a full-thickness tear, nor was there any side of significant bursal-sided tear.  The arthroscopic clip was removed and the portals repaired with mattress suture of 3-0 Prolene.  Mr. Renbarger was placed in a sling, awakened from general anesthesia, and transferred to recovery room in stable signs. He will be discharged home with prescriptions for Dilaudid 2 mg 1 or 2 tablets p.o. q.4-6 hours p.r.n. pain, 30 tablets without refill, also doxycycline 100 mg p.o. b.i.d. x4 days as a prophylactic  antibiotic and Motrin 600 mg 1 p.o. q.6 hours p.r.n. pain.     Katy Fitch Kaycee Haycraft, M.D.     RVS/MEDQ  D:  03/28/2012  T:  03/28/2012  Job:  [id number]

## 2012-03-28 NOTE — Op Note (Signed)
Op note dictated [id number]

## 2012-03-28 NOTE — Anesthesia Procedure Notes (Addendum)
Anesthesia Regional Block:  Interscalene brachial plexus block  Pre-Anesthetic Checklist: ,, timeout performed, Correct Patient, Correct Site, Correct Laterality, Correct Procedure, Correct Position, site marked, Risks and benefits discussed,  Surgical consent,  Pre-op evaluation,  At surgeon's request and post-op pain management  Laterality: Left  Prep: chloraprep       Needles:  Injection technique: Single-shot  Needle Type: Echogenic Stimulator Needle     Needle Length: 5cm 5 cm Needle Gauge: 22 and 22 G    Additional Needles:  Procedures: ultrasound guided and nerve stimulator Interscalene brachial plexus block  Nerve Stimulator or Paresthesia:  Response: biceps flexion, 0.45 mA,   Additional Responses:   Narrative:  Start time: 03/28/2012 9:01 AM End time: 03/28/2012 9:23 AM Injection made incrementally with aspirations every 5 mL.  Performed by: Personally  Anesthesiologist: Dr Chaney Malling  Additional Notes: Functioning IV was confirmed and monitors were applied.  A 50mm 22ga Arrow echogenic stimulator needle was used. Sterile prep and drape,hand hygiene and sterile gloves were used.  Negative aspiration and negative test dose prior to incremental administration of local anesthetic. The patient tolerated the procedure well.  Ultrasound guidance: relevent anatomy identified, needle position confirmed, local anesthetic spread visualized around nerve(s), vascular puncture avoided.  Image printed for medical record.   Interscalene brachial plexus block Procedure Name: Intubation Date/Time: 03/28/2012 9:48 AM Performed by: Lance Coon Pre-anesthesia Checklist: Patient identified, Timeout performed, Emergency Drugs available, Suction available and Patient being monitored Patient Re-evaluated:Patient Re-evaluated prior to inductionOxygen Delivery Method: Circle system utilized Preoxygenation: Pre-oxygenation with 100% oxygen Intubation Type: IV induction Ventilation:  Mask ventilation without difficulty Laryngoscope Size: Mac and 3 Grade View: Grade II Tube type: Oral Tube size: 7.0 mm Number of attempts: 1 Airway Equipment and Method: Stylet Placement Confirmation: ETT inserted through vocal cords under direct vision,  breath sounds checked- equal and bilateral and positive ETCO2 Secured at: 20 cm Dental Injury: Teeth and Oropharynx as per pre-operative assessment

## 2012-03-28 NOTE — Transfer of Care (Signed)
Immediate Anesthesia Transfer of Care Note  Patient: [name]  Procedure(s) Performed: Procedure(s) (LRB): SHOULDER ARTHROSCOPY WITH DISTAL CLAVICLE RESECTION (Left)  Patient Location: PACU  Anesthesia Type: General  Level of Consciousness: awake  Airway & Oxygen Therapy: Patient Spontanous Breathing and Patient connected to face mask oxygen  Post-op Assessment: Report given to PACU RN and Post -op Vital signs reviewed and stable  Post vital signs: Reviewed and stable  Complications: No apparent anesthesia complications

## 2012-03-28 NOTE — Anesthesia Preprocedure Evaluation (Signed)
Anesthesia Evaluation  Patient identified by MRN, date of birth, ID band Patient awake    Reviewed: Allergy & Precautions, H&P , NPO status , Patient's Chart, lab work & pertinent test results  Airway Mallampati: II  Neck ROM: full    Dental   Pulmonary former smoker         Cardiovascular hypertension,     Neuro/Psych    GI/Hepatic   Endo/Other  Diabetes mellitus-  Renal/GU      Musculoskeletal  (+) Arthritis -,   Abdominal   Peds  Hematology   Anesthesia Other Findings   Reproductive/Obstetrics                           Anesthesia Physical Anesthesia Plan  ASA: II  Anesthesia Plan: General and Regional   Post-op Pain Management: MAC Combined w/ Regional for Post-op pain   Induction: Intravenous  Airway Management Planned: Oral ETT  Additional Equipment:   Intra-op Plan:   Post-operative Plan: Extubation in OR  Informed Consent: I have reviewed the patients History and Physical, chart, labs and discussed the procedure including the risks, benefits and alternatives for the proposed anesthesia with the patient or authorized representative who has indicated his/her understanding and acceptance.     Plan Discussed with: CRNA and Surgeon  Anesthesia Plan Comments:         Anesthesia Quick Evaluation

## 2012-03-28 NOTE — Brief Op Note (Signed)
03/28/2012  10:56 AM  PATIENT:  [name]  61 y.o. 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  PROCEDURE:  SHOULDER ARTHROSCOPY WITH DEBRIDEMENT OF CALCIFIC TENDINOPATHY AND LABRAL PATHOLOGY, SUBACROMIAL DECOMPRESSION  AND DISTAL CLAVICLE RESECTION LEFT SHOULDER  SURGEON:  Wyn Forster., MD   PHYSICIAN ASSISTANT:   ASSISTANTS: Mallory Shirk.A-C   ANESTHESIA:   general  EBL:  Total I/O In: 1000 [I.V.:1000] Out: -   BLOOD ADMINISTERED:none  DRAINS: none   LOCAL MEDICATIONS USED:  ropivicaine plexus block  SPECIMEN:  No Specimen  DISPOSITION OF SPECIMEN:  N/A  COUNTS:  YES  TOURNIQUET:  * No tourniquets in log *  DICTATION: .Other Dictation: Dictation Number [id number]  PLAN OF CARE: Discharge to home after PACU  PATIENT DISPOSITION:  PACU - hemodynamically stable.

## 2012-03-28 NOTE — Progress Notes (Signed)
Assisted Dr. Hodierne with left, ultrasound guided, interscalene  block. Side rails up, monitors on throughout procedure. See vital signs in flow sheet. Tolerated Procedure well. 

## 2012-03-28 NOTE — Anesthesia Postprocedure Evaluation (Signed)
  Anesthesia Post-op Note  Patient: [name]  Procedure(s) Performed: Procedure(s) (LRB): SHOULDER ARTHROSCOPY WITH DISTAL CLAVICLE RESECTION (Left)  Patient Location: PACU  Anesthesia Type: GA combined with regional for post-op pain  Level of Consciousness: awake, alert  and oriented  Airway and Oxygen Therapy: Patient Spontanous Breathing  Post-op Pain: none  Post-op Assessment: Post-op Vital signs reviewed, Patient's Cardiovascular Status Stable, Respiratory Function Stable, Patent Airway and No signs of Nausea or vomiting  Post-op Vital Signs: Reviewed and stable  Complications: No apparent anesthesia complications

## 2012-03-28 NOTE — Discharge Instructions (Signed)
[facility] Instructions Hand Surgery  Wound Care: Keep your hand elevated above the level of your heart.  Do not allow it to dangle  by your side.  Keep the dressing dry and do not remove it unless your doctor advises you to do so.  He will usually change it at the time of your post-op visit.  Moving your fingers is advised to stimulate circulation but will depend on the site of your surgery.  If you have a splint applied, your doctor will advise you regarding movement.  Activity: Do not drive or operate machinery today.  Rest today and then you may return to your normal activity and work as indicated by your physician.  Diet:  Drink liquids today or eat a light diet.  You may resume a regular diet tomorrow.    General expectations: Pain for two to three days. Fingers may become slightly swollen.  Call your doctor if any of the following occur: Severe pain not relieved by pain medication. Elevated temperature. Dressing soaked with blood. Inability to move fingers. White or bluish color to fingers.    Regional Anesthesia Blocks  1. Numbness or the inability to move the "blocked" extremity may last from 3-48 hours after placement. The length of time depends on the medication injected and your individual response to the medication. If the numbness is not going away after 48 hours, call your surgeon.  2. The extremity that is blocked will need to be protected until the numbness is gone and the  Strength has returned. Because you cannot feel it, you will need to take extra care to avoid injury. Because it may be weak, you may have difficulty moving it or using it. You may not know what position it is in without looking at it while the block is in effect.  3. For blocks in the legs and feet, returning to weight bearing and walking needs to be done carefully. You will need to wait until the numbness is entirely gone and the strength has returned. You should be able to move your leg and foot  normally before you try and bear weight or walk. You will need someone to be with you when you first try to ensure you do not fall and possibly risk injury.  4. Bruising and tenderness at the needle site are common side effects and will resolve in a few days.  5. Persistent numbness or new problems with movement should be communicated to the surgeon or the [facility] [id number] [facility] [phone]).     Post Anesthesia Home Care Instructions  Activity: Get plenty of rest for the remainder of the day. A responsible adult should stay with you for 24 hours following the procedure.  For the next 24 hours, DO NOT: -Drive a car -Advertising copywriter -Drink alcoholic beverages -Take any medication unless instructed by your physician -Make any legal decisions or sign important papers.  Meals: Start with liquid foods such as gelatin or soup. Progress to regular foods as tolerated. Avoid greasy, spicy, heavy foods. If nausea and/or vomiting occur, drink only clear liquids until the nausea and/or vomiting subsides. Call your physician if vomiting continues.  Special Instructions/Symptoms: Your throat may feel dry or sore from the anesthesia or the breathing tube placed in your throat during surgery. If this causes discomfort, gargle with warm salt water. The discomfort should disappear within 24 hours.    Call your surgeon if you experience:   1.  Fever over  101.0. 2.  Inability to urinate. 3.  Nausea and/or vomiting. 4.  Extreme swelling or bruising at the surgical site. 5.  Continued bleeding from the incision. 6.  Increased pain, redness or drainage from the incision. 7.  Problems related to your pain medication.

## 2012-04-01 LAB — POCT HEMOGLOBIN-HEMACUE: Hemoglobin: 13.8 g/dL (ref 13.0–17.0)

## 2015-11-16 ENCOUNTER — Emergency Department (HOSPITAL_BASED_OUTPATIENT_CLINIC_OR_DEPARTMENT_OTHER)
Admission: EM | Admit: 2015-11-16 | Discharge: 2015-11-16 | Disposition: A | Discharge status: Home / Self Care | Payer: Medicare Other | Attending: Emergency Medicine | Admitting: Emergency Medicine

## 2015-11-16 ENCOUNTER — Emergency Department (HOSPITAL_BASED_OUTPATIENT_CLINIC_OR_DEPARTMENT_OTHER): Payer: Medicare Other

## 2015-11-16 ENCOUNTER — Encounter (HOSPITAL_BASED_OUTPATIENT_CLINIC_OR_DEPARTMENT_OTHER): Payer: Self-pay | Admitting: *Deleted

## 2015-11-16 DIAGNOSIS — Z7982 Long term (current) use of aspirin: Secondary | ICD-10-CM | POA: Insufficient documentation

## 2015-11-16 DIAGNOSIS — E785 Hyperlipidemia, unspecified: Secondary | ICD-10-CM | POA: Diagnosis not present

## 2015-11-16 DIAGNOSIS — Z7984 Long term (current) use of oral hypoglycemic drugs: Secondary | ICD-10-CM | POA: Diagnosis not present

## 2015-11-16 DIAGNOSIS — I10 Essential (primary) hypertension: Secondary | ICD-10-CM | POA: Diagnosis not present

## 2015-11-16 DIAGNOSIS — K5792 Diverticulitis of intestine, part unspecified, without perforation or abscess without bleeding: Secondary | ICD-10-CM | POA: Insufficient documentation

## 2015-11-16 DIAGNOSIS — J45909 Unspecified asthma, uncomplicated: Secondary | ICD-10-CM | POA: Insufficient documentation

## 2015-11-16 DIAGNOSIS — E119 Type 2 diabetes mellitus without complications: Secondary | ICD-10-CM | POA: Insufficient documentation

## 2015-11-16 DIAGNOSIS — R1030 Lower abdominal pain, unspecified: Secondary | ICD-10-CM | POA: Diagnosis present

## 2015-11-16 DIAGNOSIS — Z79899 Other long term (current) drug therapy: Secondary | ICD-10-CM | POA: Insufficient documentation

## 2015-11-16 DIAGNOSIS — M1990 Unspecified osteoarthritis, unspecified site: Secondary | ICD-10-CM | POA: Diagnosis not present

## 2015-11-16 DIAGNOSIS — Z87891 Personal history of nicotine dependence: Secondary | ICD-10-CM | POA: Diagnosis not present

## 2015-11-16 HISTORY — DX: Unspecified asthma, uncomplicated: J45.909

## 2015-11-16 LAB — CBC WITH DIFFERENTIAL/PLATELET
BASOS PCT: 0 %
Basophils Absolute: 0.1 10*3/uL (ref 0.0–0.1)
EOS PCT: 2 %
Eosinophils Absolute: 0.2 10*3/uL (ref 0.0–0.7)
HEMATOCRIT: 44.7 % (ref 39.0–52.0)
Hemoglobin: 15 g/dL (ref 13.0–17.0)
Lymphocytes Relative: 11 %
Lymphs Abs: 1.2 10*3/uL (ref 0.7–4.0)
MCH: 31.1 pg (ref 26.0–34.0)
MCHC: 33.6 g/dL (ref 30.0–36.0)
MCV: 92.5 fL (ref 78.0–100.0)
MONO ABS: 0.5 10*3/uL (ref 0.1–1.0)
Monocytes Relative: 4 %
NEUTROS ABS: 9.4 10*3/uL — AB (ref 1.7–7.7)
NEUTROS PCT: 83 %
PLATELETS: 246 10*3/uL (ref 150–400)
RBC: 4.83 MIL/uL (ref 4.22–5.81)
RDW: 12.6 % (ref 11.5–15.5)
WBC: 11.4 10*3/uL — ABNORMAL HIGH (ref 4.0–10.5)

## 2015-11-16 LAB — BASIC METABOLIC PANEL
ANION GAP: 6 (ref 5–15)
BUN: 16 mg/dL (ref 6–20)
CALCIUM: 9.2 mg/dL (ref 8.9–10.3)
CO2: 29 mmol/L (ref 22–32)
Chloride: 102 mmol/L (ref 101–111)
Creatinine, Ser: 1 mg/dL (ref 0.61–1.24)
GLUCOSE: 122 mg/dL — AB (ref 65–99)
Potassium: 4.2 mmol/L (ref 3.5–5.1)
Sodium: 137 mmol/L (ref 135–145)

## 2015-11-16 LAB — URINALYSIS, ROUTINE W REFLEX MICROSCOPIC
Bilirubin Urine: NEGATIVE
Glucose, UA: NEGATIVE mg/dL
Hgb urine dipstick: NEGATIVE
KETONES UR: NEGATIVE mg/dL
LEUKOCYTES UA: NEGATIVE
NITRITE: NEGATIVE
PROTEIN: NEGATIVE mg/dL
Specific Gravity, Urine: 1.018 (ref 1.005–1.030)
pH: 6 (ref 5.0–8.0)

## 2015-11-16 MED ORDER — IOHEXOL 300 MG/ML  SOLN
50.0000 mL | Freq: Once | INTRAMUSCULAR | Status: AC | PRN
  Administered 2015-11-16: 50 mL via ORAL

## 2015-11-16 MED ORDER — HYDROCODONE-ACETAMINOPHEN 5-325 MG PO TABS: 1.0000 | ORAL_TABLET | Freq: Four times a day (QID) | ORAL | Status: DC | PRN

## 2015-11-16 MED ORDER — SODIUM CHLORIDE 0.9 % IV BOLUS (SEPSIS)
500.0000 mL | Freq: Once | INTRAVENOUS | Status: AC
  Administered 2015-11-16: 500 mL via INTRAVENOUS

## 2015-11-16 MED ORDER — HYDROCODONE-ACETAMINOPHEN 5-325 MG PO TABS
2.0000 | ORAL_TABLET | Freq: Once | ORAL | Status: AC
  Administered 2015-11-16: 2 via ORAL
  Filled 2015-11-16: qty 2

## 2015-11-16 MED ORDER — IOHEXOL 300 MG/ML  SOLN
100.0000 mL | Freq: Once | INTRAMUSCULAR | Status: AC | PRN
  Administered 2015-11-16: 100 mL via INTRAVENOUS

## 2015-11-16 MED ORDER — AMOXICILLIN-POT CLAVULANATE 500-125 MG PO TABS: 1.0000 | ORAL_TABLET | Freq: Three times a day (TID) | ORAL | Status: DC

## 2015-11-16 NOTE — ED Provider Notes (Signed)
CSN: [id number]     Arrival date & time 11/16/15  [id number] History   First MD Initiated Contact with Patient 11/16/15 [phone]     Chief Complaint  Patient presents with  . Abdominal Pain     (Consider location/radiation/quality/duration/timing/severity/associated sxs/prior Treatment) HPI Comments: Patient is a 64 year old male with history of type 2 diabetes and hypertension. He presents for evaluation of abdominal pain. This started 2 days ago and is located in his lower abdomen. He denies any nausea, vomiting, diarrhea, or constipation. He denies any urinary complaints. He denies any fevers or chills.   He has a history of kidney stones, however this feels different. He does report that he had a colonoscopy several years ago which was unremarkable.  Patient is a 64 y.o. male presenting with abdominal pain. The history is provided by the patient.  Abdominal Pain Pain location:  Suprapubic Pain quality: cramping   Pain radiates to:  Does not radiate Pain severity:  Moderate Onset quality:  Sudden Duration:  2 days Timing:  Constant Progression:  Worsening Chronicity:  New Relieved by:  Nothing Worsened by:  Nothing tried Ineffective treatments:  None tried   Past Medical History  Diagnosis Date  . Environmental allergies   . Arthritis   . Diabetes mellitus   . Hypertension   . Hyperlipemia   . Diverticular disease   . Back pain   . Asthma    Past Surgical History  Procedure Laterality Date  . Trigger finger release  11/17/2011    Procedure: RELEASE TRIGGER FINGER/A-1 PULLEY;  Surgeon: Wyn Forsterobert V Sypher Jr., MD;  Location: [facility];  Service: Orthopedics;  Laterality: Left;  RELEASE LEFT LONG TRIGGER FINGER  . Achilles tendon repair  1974    left  . Shoulder arthroscopy  8/12    right  . Tumor removal      rt neck/chin  . Kidney stone surgery    . Trigger finger release  12/12    lt long finger  . Knee arthroscopy      rt  . Colonoscopy     No family  history on file. Social History  Substance Use Topics  . Smoking status: Former Smoker    Quit date: 03/25/2010  . Smokeless tobacco: None  . Alcohol Use: Yes     Comment: occ    Review of Systems  Gastrointestinal: Positive for abdominal pain.  All other systems reviewed and are negative.     Allergies  Hibiclens and Flagyl  Home Medications   Prior to Admission medications   Medication Sig Start Date End Date Taking? Authorizing Provider  albuterol (PROVENTIL HFA;VENTOLIN HFA) 108 (90 BASE) MCG/ACT inhaler Inhale 2 puffs into the lungs every 6 (six) hours as needed.    Historical Provider, MD  allopurinol (ZYLOPRIM) 300 MG tablet Take 300 mg by mouth daily.    Historical Provider, MD  amLODipine (NORVASC) 5 MG tablet Take 5 mg by mouth daily.    Historical Provider, MD  aspirin 81 MG tablet Take 81 mg by mouth daily.    Historical Provider, MD  fenofibrate 160 MG tablet Take 160 mg by mouth daily.    Historical Provider, MD  metFORMIN (GLUCOPHAGE) 1000 MG tablet Take 1,000 mg by mouth 2 (two) times daily with a meal.    Historical Provider, MD  rosuvastatin (CRESTOR) 20 MG tablet Take 20 mg by mouth at bedtime.    Historical Provider, MD   BP 163/88 mmHg  Pulse 64  Temp(Src)  98.5 F (36.9 C) (Oral)  Resp 19  SpO2 99% Physical Exam  Constitutional: He is oriented to person, place, and time. He appears well-developed and well-nourished. No distress.  HENT:  Head: Normocephalic and atraumatic.  Neck: Normal range of motion. Neck supple.  Cardiovascular: Normal rate, regular rhythm and normal heart sounds.   No murmur heard. Pulmonary/Chest: Effort normal and breath sounds normal. No respiratory distress. He has no wheezes.  Abdominal: Soft. Bowel sounds are normal. He exhibits no distension and no mass. There is tenderness. There is no rebound and no guarding.  There is tenderness to palpation in the suprapubic region.   Neurological: He is alert and oriented to person,  place, and time.  Skin: Skin is warm and dry. He is not diaphoretic.  Nursing note and vitals reviewed.   ED Course  Procedures (including critical care time) Labs Review Labs Reviewed  URINALYSIS, ROUTINE W REFLEX MICROSCOPIC (NOT AT [facility])  BASIC METABOLIC PANEL  CBC WITH DIFFERENTIAL/PLATELET    Imaging Review No results found. I have personally reviewed and evaluated these images and lab results as part of my medical decision-making.   EKG Interpretation None      MDM   Final diagnoses:  None    Patient is a 64 year old male who presents with lower abdominal pain. He is tender in the suprapubic region. Workup reveals a mild white count and CT scan shows acute diverticulitis with possible early inflammatory phlegmon. This will be treated with oral antibiotics and follow-up with his primary doctor.    Geoffery Lyons, MD 11/16/15 [phone]

## 2015-11-16 NOTE — ED Notes (Signed)
Patient c/o lower middle abd pain for the past two days, no v/d but constant pain and tender to touch

## 2015-11-16 NOTE — Discharge Instructions (Signed)
Augmentin as prescribed.  Hydrocodone as prescribed as needed for pain.  Return to the emergency department if symptoms significantly worsen or change.   Diverticulitis Diverticulitis is inflammation or infection of small pouches in your colon that form when you have a condition called diverticulosis. The pouches in your colon are called diverticula. Your colon, or large intestine, is where water is absorbed and stool is formed. Complications of diverticulitis can include:  Bleeding.  Severe infection.  Severe pain.  Perforation of your colon.  Obstruction of your colon. CAUSES  Diverticulitis is caused by bacteria. Diverticulitis happens when stool becomes trapped in diverticula. This allows bacteria to grow in the diverticula, which can lead to inflammation and infection. RISK FACTORS People with diverticulosis are at risk for diverticulitis. Eating a diet that does not include enough fiber from fruits and vegetables may make diverticulitis more likely to develop. SYMPTOMS  Symptoms of diverticulitis may include:  Abdominal pain and tenderness. The pain is normally located on the left side of the abdomen, but may occur in other areas.  Fever and chills.  Bloating.  Cramping.  Nausea.  Vomiting.  Constipation.  Diarrhea.  Blood in your stool. DIAGNOSIS  Your health care provider will ask you about your medical history and do a physical exam. You may need to have tests done because many medical conditions can cause the same symptoms as diverticulitis. Tests may include:  Blood tests.  Urine tests.  Imaging tests of the abdomen, including X-rays and CT scans. When your condition is under control, your health care provider may recommend that you have a colonoscopy. A colonoscopy can show how severe your diverticula are and whether something else is causing your symptoms. TREATMENT  Most cases of diverticulitis are mild and can be treated at home. Treatment may  include:  Taking over-the-counter pain medicines.  Following a clear liquid diet.  Taking antibiotic medicines by mouth for 7-10 days. More severe cases may be treated at a hospital. Treatment may include:  Not eating or drinking.  Taking prescription pain medicine.  Receiving antibiotic medicines through an IV tube.  Receiving fluids and nutrition through an IV tube.  Surgery. HOME CARE INSTRUCTIONS   Follow your health care provider's instructions carefully.  Follow a full liquid diet or other diet as directed by your health care provider. After your symptoms improve, your health care provider may tell you to change your diet. He or she may recommend you eat a high-fiber diet. Fruits and vegetables are good sources of fiber. Fiber makes it easier to pass stool.  Take fiber supplements or probiotics as directed by your health care provider.  Only take medicines as directed by your health care provider.  Keep all your follow-up appointments. SEEK MEDICAL CARE IF:   Your pain does not improve.  You have a hard time eating food.  Your bowel movements do not return to normal. SEEK IMMEDIATE MEDICAL CARE IF:   Your pain becomes worse.  Your symptoms do not get better.  Your symptoms suddenly get worse.  You have a fever.  You have repeated vomiting.  You have bloody or black, tarry stools. MAKE SURE YOU:   Understand these instructions.  Will watch your condition.  Will get help right away if you are not doing well or get worse.   This information is not intended to replace advice given to you by your health care provider. Make sure you discuss any questions you have with your health care provider.  Document Released: 08/30/2005 Document Revised: 11/25/2013 Document Reviewed: 10/15/2013 Elsevier Interactive Patient Education [employer].

## 2016-01-02 ENCOUNTER — Encounter (HOSPITAL_BASED_OUTPATIENT_CLINIC_OR_DEPARTMENT_OTHER): Payer: Self-pay | Admitting: *Deleted

## 2016-01-02 ENCOUNTER — Emergency Department (HOSPITAL_BASED_OUTPATIENT_CLINIC_OR_DEPARTMENT_OTHER)
Admission: EM | Admit: 2016-01-02 | Discharge: 2016-01-02 | Disposition: A | Discharge status: Home / Self Care | Payer: Medicare Other | Attending: Emergency Medicine | Admitting: Emergency Medicine

## 2016-01-02 ENCOUNTER — Emergency Department (HOSPITAL_BASED_OUTPATIENT_CLINIC_OR_DEPARTMENT_OTHER): Payer: Medicare Other

## 2016-01-02 DIAGNOSIS — E785 Hyperlipidemia, unspecified: Secondary | ICD-10-CM | POA: Insufficient documentation

## 2016-01-02 DIAGNOSIS — Y998 Other external cause status: Secondary | ICD-10-CM | POA: Diagnosis not present

## 2016-01-02 DIAGNOSIS — I10 Essential (primary) hypertension: Secondary | ICD-10-CM | POA: Diagnosis not present

## 2016-01-02 DIAGNOSIS — Z7984 Long term (current) use of oral hypoglycemic drugs: Secondary | ICD-10-CM | POA: Insufficient documentation

## 2016-01-02 DIAGNOSIS — X58XXXA Exposure to other specified factors, initial encounter: Secondary | ICD-10-CM | POA: Insufficient documentation

## 2016-01-02 DIAGNOSIS — Z79899 Other long term (current) drug therapy: Secondary | ICD-10-CM | POA: Diagnosis not present

## 2016-01-02 DIAGNOSIS — Z87891 Personal history of nicotine dependence: Secondary | ICD-10-CM | POA: Insufficient documentation

## 2016-01-02 DIAGNOSIS — Y9389 Activity, other specified: Secondary | ICD-10-CM | POA: Diagnosis not present

## 2016-01-02 DIAGNOSIS — S53401A Unspecified sprain of right elbow, initial encounter: Secondary | ICD-10-CM | POA: Diagnosis not present

## 2016-01-02 DIAGNOSIS — E119 Type 2 diabetes mellitus without complications: Secondary | ICD-10-CM | POA: Insufficient documentation

## 2016-01-02 DIAGNOSIS — J45909 Unspecified asthma, uncomplicated: Secondary | ICD-10-CM | POA: Insufficient documentation

## 2016-01-02 DIAGNOSIS — S46911A Strain of unspecified muscle, fascia and tendon at shoulder and upper arm level, right arm, initial encounter: Secondary | ICD-10-CM

## 2016-01-02 DIAGNOSIS — Z8719 Personal history of other diseases of the digestive system: Secondary | ICD-10-CM | POA: Diagnosis not present

## 2016-01-02 DIAGNOSIS — Z7982 Long term (current) use of aspirin: Secondary | ICD-10-CM | POA: Insufficient documentation

## 2016-01-02 DIAGNOSIS — M1990 Unspecified osteoarthritis, unspecified site: Secondary | ICD-10-CM | POA: Insufficient documentation

## 2016-01-02 DIAGNOSIS — Y9289 Other specified places as the place of occurrence of the external cause: Secondary | ICD-10-CM | POA: Insufficient documentation

## 2016-01-02 DIAGNOSIS — S59901A Unspecified injury of right elbow, initial encounter: Secondary | ICD-10-CM | POA: Diagnosis present

## 2016-01-02 NOTE — Discharge Instructions (Signed)
It is okay to use the compressive band around her forearm for discomfort. Hold an ice pack on your elbow 4 times daily for 30 minutes at a time. Call Dr.Hudnall to schedule office visit if you do not have full range of motion of your elbow in a week. Take Tylenol as directed for pain. Start taking your Losartan again today. Get your blood pressure rechecked at your primary care doctor's office in a week. Today's blood pressure was elevated at 176/91.

## 2016-01-02 NOTE — ED Provider Notes (Signed)
CSN: [id number]     Arrival date & time 01/02/16  3086 History   First MD Initiated Contact with Patient 01/02/16 [phone]     Chief Complaint  Patient presents with  . Elbow Pain     (Consider location/radiation/quality/duration/timing/severity/associated sxs/prior Treatment) HPI Complains of right elbow pain, nonradiating pain worse with extending his elbow onset 4 days ago. He thinks it pain came after playing table tennis tonight before. No definite trauma. He is unable to fully extend his elbow however he is much improved and can extend his elbow to a much greater extent than 4 days ago. He is steadily improving with time. He denies fever. No other associated symptoms. He believes that he strained his elbow. He has treated himself with ibuprofen and ice with partial relief Past Medical History  Diagnosis Date  . Environmental allergies   . Arthritis   . Diabetes mellitus   . Hypertension   . Hyperlipemia   . Diverticular disease   . Back pain   . Asthma    Past Surgical History  Procedure Laterality Date  . Trigger finger release  11/17/2011    Procedure: RELEASE TRIGGER FINGER/A-1 PULLEY;  Surgeon: Wyn Forster., MD;  Location: [facility];  Service: Orthopedics;  Laterality: Left;  RELEASE LEFT LONG TRIGGER FINGER  . Achilles tendon repair  1974    left  . Shoulder arthroscopy  8/12    right  . Tumor removal      rt neck/chin  . Kidney stone surgery    . Trigger finger release  12/12    lt long finger  . Knee arthroscopy      rt  . Colonoscopy     No family history on file. Social History  Substance Use Topics  . Smoking status: Former Smoker    Quit date: 03/25/2010  . Smokeless tobacco: Not on file  . Alcohol Use: Yes     Comment: occ    Review of Systems  Musculoskeletal: Positive for arthralgias.  Allergic/Immunologic: Positive for immunocompromised state.       Diabetic  All other systems reviewed and are negative.     Allergies   Hibiclens and Flagyl  Home Medications   Prior to Admission medications   Medication Sig Start Date End Date Taking? Authorizing Provider  albuterol (PROVENTIL HFA;VENTOLIN HFA) 108 (90 BASE) MCG/ACT inhaler Inhale 2 puffs into the lungs every 6 (six) hours as needed.    Historical Provider, MD  allopurinol (ZYLOPRIM) 300 MG tablet Take 300 mg by mouth daily.    Historical Provider, MD  amLODipine (NORVASC) 5 MG tablet Take 5 mg by mouth daily.    Historical Provider, MD  amoxicillin-clavulanate (AUGMENTIN) 500-125 MG tablet Take 1 tablet (500 mg total) by mouth every 8 (eight) hours. 11/16/15   Geoffery Lyons, MD  aspirin 81 MG tablet Take 81 mg by mouth daily.    Historical Provider, MD  fenofibrate 160 MG tablet Take 160 mg by mouth daily.    Historical Provider, MD  HYDROcodone-acetaminophen (NORCO) 5-325 MG tablet Take 1-2 tablets by mouth every 6 (six) hours as needed. 11/16/15   Geoffery Lyons, MD  metFORMIN (GLUCOPHAGE) 1000 MG tablet Take 1,000 mg by mouth 2 (two) times daily with a meal.    Historical Provider, MD  rosuvastatin (CRESTOR) 20 MG tablet Take 20 mg by mouth at bedtime.    Historical Provider, MD   is no longer on Augmentin, no longer on metformin. Diabetes has been  well-controlled with diet and exercise. He stopped low Sartin several months ago, trying to control blood pressure with diet and exercise BP 172/86 mmHg  Pulse 56  Temp(Src) 98.1 F (36.7 C) (Oral)  Resp 14  Ht  (1.676 m)  Wt 180 lb (81.647 kg)  BMI 29.07 kg/m2  SpO2 99% Physical Exam  Constitutional: He appears well-developed and well-nourished. No distress.  HENT:  Head: Normocephalic and atraumatic.  Eyes: Pupils are equal, round, and reactive to light.  Neck: Neck supple.  Pulmonary/Chest: Effort normal.  Abdominal: He exhibits no distension.  Musculoskeletal:  Right upper extremity no redness no point tenderness no swelling. He is able to extend his elbow to approximately 170. Radial pulse  2+ grip strength 5 over 5 overall of other extremities at redness swelling or tenderness neurovascular intact  Skin: He is not diaphoretic.  Nursing note and vitals reviewed.   ED Course  Procedures (including critical care time) Labs Review Labs Reviewed - No data to display  Imaging Review No results found. I have personally reviewed and evaluated these images and lab results as part of my medical decision-making.   EKG Interpretation None     X-ray viewed by me. Results for orders placed or performed during the hospital encounter of 11/16/15  Urinalysis, Routine w reflex microscopic (not at [facility])  Result Value Ref Range   Color, Urine YELLOW YELLOW   APPearance CLEAR CLEAR   Specific Gravity, Urine 1.018 1.005 - 1.030   pH 6.0 5.0 - 8.0   Glucose, UA NEGATIVE NEGATIVE mg/dL   Hgb urine dipstick NEGATIVE NEGATIVE   Bilirubin Urine NEGATIVE NEGATIVE   Ketones, ur NEGATIVE NEGATIVE mg/dL   Protein, ur NEGATIVE NEGATIVE mg/dL   Nitrite NEGATIVE NEGATIVE   Leukocytes, UA NEGATIVE NEGATIVE  Basic metabolic panel  Result Value Ref Range   Sodium 137 135 - 145 mmol/L   Potassium 4.2 3.5 - 5.1 mmol/L   Chloride 102 101 - 111 mmol/L   CO2 29 22 - 32 mmol/L   Glucose, Bld 122 (H) 65 - 99 mg/dL   BUN 16 6 - 20 mg/dL   Creatinine, Ser 1.61 0.61 - 1.24 mg/dL   Calcium 9.2 8.9 - 09.6 mg/dL   GFR calc non Af Amer >60 >60 mL/min   GFR calc Af Amer >60 >60 mL/min   Anion gap 6 5 - 15  CBC with Differential  Result Value Ref Range   WBC 11.4 (H) 4.0 - 10.5 K/uL   RBC 4.83 4.22 - 5.81 MIL/uL   Hemoglobin 15.0 13.0 - 17.0 g/dL   HCT 04.5 40.9 - 81.1 %   MCV 92.5 78.0 - 100.0 fL   MCH 31.1 26.0 - 34.0 pg   MCHC 33.6 30.0 - 36.0 g/dL   RDW 91.4 78.2 - 95.6 %   Platelets 246 150 - 400 K/uL   Neutrophils Relative % 83 %   Neutro Abs 9.4 (H) 1.7 - 7.7 K/uL   Lymphocytes Relative 11 %   Lymphs Abs 1.2 0.7 - 4.0 K/uL   Monocytes Relative 4 %   Monocytes Absolute 0.5 0.1 - 1.0  K/uL   Eosinophils Relative 2 %   Eosinophils Absolute 0.2 0.0 - 0.7 K/uL   Basophils Relative 0 %   Basophils Absolute 0.1 0.0 - 0.1 K/uL   Dg Elbow Complete Right  01/02/2016  CLINICAL DATA:  Acute right elbow pain for 5 days, decreased range of motion. EXAM: RIGHT ELBOW - COMPLETE 3+ VIEW COMPARISON:  None. FINDINGS: Normal alignment without acute osseous finding or fracture. Minor chondrocalcinosis present within the elbow joint. Possible small effusion on the lateral view. No definite soft tissue abnormality. IMPRESSION: Degenerative changes with chondrocalcinosis and a small effusion suspected on the lateral view. No acute osseous finding or fracture. Electronically Signed   By: Judie Petit.  Shick M.D.   On: 01/02/2016 09:12    MDM  Patient's symptoms steadily improving with time. No signs of infection. There is no obvious area of elbow that requires aspiration or drainage.  instructed to Start low Sartin again. PLan Tylenol for pain. Blood pressure recheck 1 week. Ice. Referral Dr.HudnaLL if not back to normal 1 week Final diagnoses:  None   diagnosis #1 right elbow strain #2 elevated blood pressure      Doug Sou, MD 01/02/16 1007

## 2016-01-02 NOTE — ED Notes (Signed)
Presents with Rt elbow pain, onset this past wed, has tried ice and ibuprofen without relief

## 2016-05-12 ENCOUNTER — Emergency Department (HOSPITAL_BASED_OUTPATIENT_CLINIC_OR_DEPARTMENT_OTHER): Payer: Medicare Other

## 2016-05-12 ENCOUNTER — Encounter (HOSPITAL_BASED_OUTPATIENT_CLINIC_OR_DEPARTMENT_OTHER): Payer: Self-pay | Admitting: Emergency Medicine

## 2016-05-12 ENCOUNTER — Emergency Department (HOSPITAL_BASED_OUTPATIENT_CLINIC_OR_DEPARTMENT_OTHER)
Admission: EM | Admit: 2016-05-12 | Discharge: 2016-05-12 | Disposition: A | Discharge status: Home / Self Care | Payer: Medicare Other | Attending: Emergency Medicine | Admitting: Emergency Medicine

## 2016-05-12 DIAGNOSIS — Z7984 Long term (current) use of oral hypoglycemic drugs: Secondary | ICD-10-CM | POA: Insufficient documentation

## 2016-05-12 DIAGNOSIS — E785 Hyperlipidemia, unspecified: Secondary | ICD-10-CM | POA: Diagnosis not present

## 2016-05-12 DIAGNOSIS — E119 Type 2 diabetes mellitus without complications: Secondary | ICD-10-CM | POA: Insufficient documentation

## 2016-05-12 DIAGNOSIS — I10 Essential (primary) hypertension: Secondary | ICD-10-CM | POA: Diagnosis not present

## 2016-05-12 DIAGNOSIS — M1990 Unspecified osteoarthritis, unspecified site: Secondary | ICD-10-CM | POA: Insufficient documentation

## 2016-05-12 DIAGNOSIS — M25561 Pain in right knee: Secondary | ICD-10-CM

## 2016-05-12 DIAGNOSIS — Z87891 Personal history of nicotine dependence: Secondary | ICD-10-CM | POA: Insufficient documentation

## 2016-05-12 DIAGNOSIS — J45909 Unspecified asthma, uncomplicated: Secondary | ICD-10-CM | POA: Diagnosis not present

## 2016-05-12 MED ORDER — NAPROXEN 500 MG PO TABS: 500.0000 mg | ORAL_TABLET | Freq: Two times a day (BID) | ORAL | Status: DC

## 2016-05-12 MED FILL — NAPROXEN 500 MG TABLET: 500 | 15 days supply | Qty: 30 | Fill #0

## 2016-05-12 NOTE — ED Notes (Addendum)
Rt knee pain x3 days, medially. Non-traumatic. Prior hx of surgery to same knee.   Acetaminophen 1 gm last taken 1 hr ago. Improved pain from 7 --> 4/10.

## 2016-05-12 NOTE — Discharge Instructions (Signed)
Return to the ED with any concerns including increased pain, not able to bear weight, fever, redness of knee, or any other alarming symptoms

## 2016-05-12 NOTE — ED Provider Notes (Signed)
CSN: [id number]     Arrival date & time 05/12/16  0727 History   First MD Initiated Contact with Patient 05/12/16 [phone]     Chief Complaint  Patient presents with  . Knee Pain     (Consider location/radiation/quality/duration/timing/severity/associated sxs/prior Treatment) HPI  Pt presenting with pain in right knee.  He states pain started this morning while he was helping get his son ready for school.  Pain is on the inner surface of right knee, hurts worse to walk up stairs.  No traumatic injury or falls.  No swelling or redness of knee.  No fevers.  He is able to bear weight.  He has taken tylenol this morning and states the pain feels relieved. He has also placed a knee sleeve and states this helps as well.  There are no other associated systemic symptoms, there are no other alleviating or modifying factors.   Past Medical History  Diagnosis Date  . Environmental allergies   . Arthritis   . Diabetes mellitus   . Hypertension   . Hyperlipemia   . Diverticular disease   . Back pain   . Asthma    Past Surgical History  Procedure Laterality Date  . Trigger finger release  11/17/2011    Procedure: RELEASE TRIGGER FINGER/A-1 PULLEY;  Surgeon: Wyn Forster., MD;  Location: [facility];  Service: Orthopedics;  Laterality: Left;  RELEASE LEFT LONG TRIGGER FINGER  . Achilles tendon repair  1974    left  . Shoulder arthroscopy  8/12    right  . Tumor removal      rt neck/chin  . Kidney stone surgery    . Trigger finger release  12/12    lt long finger  . Knee arthroscopy      rt  . Colonoscopy     History reviewed. No pertinent family history. Social History  Substance Use Topics  . Smoking status: Former Smoker    Quit date: 03/25/2010  . Smokeless tobacco: None  . Alcohol Use: Yes     Comment: occ    Review of Systems  ROS reviewed and all otherwise negative except for mentioned in HPI    Allergies  Hibiclens and Flagyl  Home Medications    Prior to Admission medications   Medication Sig Start Date End Date Taking? Authorizing Provider  albuterol (PROVENTIL HFA;VENTOLIN HFA) 108 (90 BASE) MCG/ACT inhaler Inhale 2 puffs into the lungs every 6 (six) hours as needed.    Historical Provider, MD  allopurinol (ZYLOPRIM) 300 MG tablet Take 300 mg by mouth daily.    Historical Provider, MD  amLODipine (NORVASC) 5 MG tablet Take 5 mg by mouth daily.    Historical Provider, MD  amoxicillin-clavulanate (AUGMENTIN) 500-125 MG tablet Take 1 tablet (500 mg total) by mouth every 8 (eight) hours. 11/16/15   Geoffery Lyons, MD  aspirin 81 MG tablet Take 81 mg by mouth daily.    Historical Provider, MD  fenofibrate 160 MG tablet Take 160 mg by mouth daily.    Historical Provider, MD  HYDROcodone-acetaminophen (NORCO) 5-325 MG tablet Take 1-2 tablets by mouth every 6 (six) hours as needed. 11/16/15   Geoffery Lyons, MD  metFORMIN (GLUCOPHAGE) 1000 MG tablet Take 1,000 mg by mouth 2 (two) times daily with a meal.    Historical Provider, MD  naproxen (NAPROSYN) 500 MG tablet Take 1 tablet (500 mg total) by mouth 2 (two) times daily. 05/12/16   Jerelyn Scott, MD  rosuvastatin (CRESTOR) 20 MG  tablet Take 20 mg by mouth at bedtime.    Historical Provider, MD   BP 164/84 mmHg  Pulse 57  Temp(Src) 97 F (36.1 C) (Oral)  Resp 20  Ht 5\' 6"  (1.676 m)  Wt 185 lb (83.915 kg)  BMI 29.87 kg/m2  SpO2 99%  Vitals reviewed Physical Exam  Physical Examination: General appearance - alert, well appearing, and in no distress Mental status - alert, oriented to person, place, and time Eyes - no conjunctival injection no scleral icterus Chest - clear to auscultation, no wheezes, rales or rhonchi, symmetric air entry Neurological - alert, oriented, normal speech, sensation and strength intact in right LE Musculoskeletal - no specific joint tenderness, deformity or swelling- pt points to medial area of right knee but no point tenderness or pain on ROM, no effusion, no  overlying redness or warmth.   Extremities - peripheral pulses normal, no pedal edema, no clubbing or cyanosis Skin - normal coloration and turgor, no rashes  ED Course  Procedures (including critical care time) Labs Review Labs Reviewed - No data to display  Imaging Review Dg Knee Complete 4 Views Right  05/12/2016  CLINICAL DATA:  Medial right knee pain. EXAM: RIGHT KNEE - COMPLETE 4+ VIEW COMPARISON:  None. FINDINGS: No evidence of fracture, or dislocation. There is a small suprapatellar joint effusion. The joint spaces are preserved, however there is a prominent chondrocalcinosis within both patellofemoral compartments of the knee joint. Mild osteophytosis is also noted. Vascular calcifications are seen, soft tissues otherwise unremarkable. IMPRESSION: Prominent patellofemoral compartment chondrocalcinosis, usually associated with inflammatory arthropathy such as CPPD arthropathy. Minimal osteoarthritic changes. Calcified vascular atherosclerosis. Electronically Signed   By: Ted Mcalpineobrinka  Dimitrova M.D.   On: 05/12/2016 07:58   I have personally reviewed and evaluated these images and lab results as part of my medical decision-making.   EKG Interpretation None      MDM   Final diagnoses:  Right knee pain    Pt presenting with right knee pain.  On exam he has no bony point tenderness or significant pain on ROM.  He feels improved after taking tylenol and applying knee sleeve at home prior to arrival.  Xray shows signs c/w possible pseudogout- no effusion no overlying erythema or warmth.  Pt advised of these findings but his symptoms today are not c/w this clinically.  Given rx for naproxen.  Advised f/u with PMD.  Discharged with strict return precautions.  Pt agreeable with plan.  7:46 AM went to see patient, he is not in room  Jerelyn ScottMartha Linker, MD 05/12/16 [phone]

## 2017-05-02 ENCOUNTER — Emergency Department (HOSPITAL_BASED_OUTPATIENT_CLINIC_OR_DEPARTMENT_OTHER): Payer: Medicare Other

## 2017-05-02 ENCOUNTER — Encounter (HOSPITAL_BASED_OUTPATIENT_CLINIC_OR_DEPARTMENT_OTHER): Payer: Self-pay | Admitting: Emergency Medicine

## 2017-05-02 ENCOUNTER — Emergency Department (HOSPITAL_BASED_OUTPATIENT_CLINIC_OR_DEPARTMENT_OTHER)
Admission: EM | Admit: 2017-05-02 | Discharge: 2017-05-02 | Disposition: A | Discharge status: Home / Self Care | Payer: Medicare Other | Attending: Emergency Medicine | Admitting: Emergency Medicine

## 2017-05-02 DIAGNOSIS — I10 Essential (primary) hypertension: Secondary | ICD-10-CM | POA: Diagnosis not present

## 2017-05-02 DIAGNOSIS — Z7984 Long term (current) use of oral hypoglycemic drugs: Secondary | ICD-10-CM | POA: Insufficient documentation

## 2017-05-02 DIAGNOSIS — J45909 Unspecified asthma, uncomplicated: Secondary | ICD-10-CM | POA: Diagnosis not present

## 2017-05-02 DIAGNOSIS — E119 Type 2 diabetes mellitus without complications: Secondary | ICD-10-CM | POA: Insufficient documentation

## 2017-05-02 DIAGNOSIS — R079 Chest pain, unspecified: Secondary | ICD-10-CM | POA: Diagnosis present

## 2017-05-02 DIAGNOSIS — Z7982 Long term (current) use of aspirin: Secondary | ICD-10-CM | POA: Insufficient documentation

## 2017-05-02 DIAGNOSIS — R0789 Other chest pain: Secondary | ICD-10-CM | POA: Diagnosis not present

## 2017-05-02 DIAGNOSIS — Z79899 Other long term (current) drug therapy: Secondary | ICD-10-CM | POA: Diagnosis not present

## 2017-05-02 DIAGNOSIS — Z87891 Personal history of nicotine dependence: Secondary | ICD-10-CM | POA: Insufficient documentation

## 2017-05-02 NOTE — Discharge Instructions (Signed)
Ibuprofen 600 mg 3 times daily for the next 5 days.  Rest.  Return to the emergency department if you develop worsening pain, high fevers, to focally breathing, or other new and concerning symptoms.

## 2017-05-02 NOTE — ED Triage Notes (Signed)
Pt c/o RT rib pain x 3 wks w/ no known injury

## 2017-05-02 NOTE — ED Provider Notes (Signed)
[facility] Provider Note   CSN: [id number] Arrival date & time: 05/02/17  2130     History   Chief Complaint Chief Complaint  Patient presents with  . Chest Pain    HPI [name] is a 66 y.o. male.  Patient is a 66 year old male with history of diabetes, hypertension. He presents with complaints of pain in his right lateral ribs. This is been ongoing for the past 3 weeks. It began in the absence of any injury or trauma but does believe he may have pulled a muscle while working out. He denies any difficulty breathing, nausea, or diaphoresis.   The history is provided by the patient.  Chest Pain   This is a new problem. Episode onset: 3 weeks ago. The problem occurs constantly. The problem has not changed since onset.The pain is present in the lateral region. The pain is moderate. Pertinent negatives include no cough, no diaphoresis, no nausea and no palpitations.    Past Medical History:  Diagnosis Date  . Arthritis   . Asthma   . Back pain   . Diabetes mellitus   . Diverticular disease   . Environmental allergies   . Hyperlipemia   . Hypertension     There are no active problems to display for this patient.   Past Surgical History:  Procedure Laterality Date  . ACHILLES TENDON REPAIR  1974   left  . COLONOSCOPY    . KIDNEY STONE SURGERY    . KNEE ARTHROSCOPY     rt  . SHOULDER ARTHROSCOPY  8/12   right  . TRIGGER FINGER RELEASE  11/17/2011   Procedure: RELEASE TRIGGER FINGER/A-1 PULLEY;  Surgeon: Wyn Forster., MD;  Location: [facility];  Service: Orthopedics;  Laterality: Left;  RELEASE LEFT LONG TRIGGER FINGER  . TRIGGER FINGER RELEASE  12/12   lt long finger  . TUMOR REMOVAL     rt neck/chin       Home Medications    Prior to Admission medications   Medication Sig Start Date End Date Taking? Authorizing Provider  LISINOPRIL PO Take by mouth.   Yes [provider]  albuterol (PROVENTIL HFA;VENTOLIN HFA)  108 (90 BASE) MCG/ACT inhaler Inhale 2 puffs into the lungs every 6 (six) hours as needed.    [provider]  allopurinol (ZYLOPRIM) 300 MG tablet Take 300 mg by mouth daily.    [provider]  amLODipine (NORVASC) 5 MG tablet Take 5 mg by mouth daily.    [provider]  amoxicillin-clavulanate (AUGMENTIN) 500-125 MG tablet Take 1 tablet (500 mg total) by mouth every 8 (eight) hours. 11/16/15   Geoffery Lyons, MD  aspirin 81 MG tablet Take 81 mg by mouth daily.    [provider]  fenofibrate 160 MG tablet Take 160 mg by mouth daily.    [provider]  HYDROcodone-acetaminophen (NORCO) 5-325 MG tablet Take 1-2 tablets by mouth every 6 (six) hours as needed. 11/16/15   Geoffery Lyons, MD  metFORMIN (GLUCOPHAGE) 1000 MG tablet Take 1,000 mg by mouth 2 (two) times daily with a meal.    [provider]  naproxen (NAPROSYN) 500 MG tablet Take 1 tablet (500 mg total) by mouth 2 (two) times daily. 05/12/16   Jerelyn Scott, MD  rosuvastatin (CRESTOR) 20 MG tablet Take 20 mg by mouth at bedtime.    [provider]    Family History No family history on file.  Social History Social History  Substance Use Topics  . Smoking status: Former Smoker    Quit date: 03/25/2010  . Smokeless tobacco: Never Used  . Alcohol use Yes     Comment: occ     Allergies   Hibiclens [chlorhexidine gluconate] and Flagyl [metronidazole hcl]   Review of Systems Review of Systems  Constitutional: Negative for diaphoresis.  Respiratory: Negative for cough.   Cardiovascular: Positive for chest pain. Negative for palpitations.  Gastrointestinal: Negative for nausea.  All other systems reviewed and are negative.    Physical Exam Updated Vital Signs BP (!) 159/92 (BP Location: Left Arm)   Pulse 62   Temp 98.4 F (36.9 C) (Oral)   Resp 18   Ht 5\' 5"  (1.651 m)   Wt 83.9 kg (185 lb)   SpO2 99%   BMI 30.79 kg/m   Physical Exam  Constitutional:  He is oriented to person, place, and time. He appears well-developed and well-nourished. No distress.  HENT:  Head: Normocephalic and atraumatic.  Mouth/Throat: Oropharynx is clear and moist.  Neck: Normal range of motion. Neck supple.  Cardiovascular: Normal rate and regular rhythm.  Exam reveals no friction rub.   No murmur heard. Pulmonary/Chest: Effort normal and breath sounds normal. No respiratory distress. He has no wheezes. He has no rales. He exhibits tenderness.  There is tenderness to palpation of the right lateral ribs just below the latissimus dorsi muscle.  Abdominal: Soft. Bowel sounds are normal. He exhibits no distension. There is no tenderness.  Musculoskeletal: Normal range of motion. He exhibits no edema.  Neurological: He is alert and oriented to person, place, and time. Coordination normal.  Skin: Skin is warm and dry. He is not diaphoretic.  Nursing note and vitals reviewed.    ED Treatments / Results  Labs (all labs ordered are listed, but only abnormal results are displayed) Labs Reviewed - No data to display  EKG  EKG Interpretation None       Radiology No results found.  Procedures Procedures (including critical care time)  Medications Ordered in ED Medications - No data to display   Initial Impression / Assessment and Plan / ED Course  I have reviewed the triage vital signs and the nursing notes.  Pertinent labs & imaging results that were available during my care of the patient were reviewed by me and considered in my medical decision making (see chart for details).  Patient presents with complaints of pain in his right lateral rib area for the past 3 weeks. I suspect he likely injured himself while in the gym. His x-ray reveals no evidence for rib fracture, pneumothorax, or other obvious abnormality. His oxygen saturations are 99% and there is no tachycardia or tachypnea. I highly doubt pulmonary embolism. I will recommend anti-inflammatory  medications, rest, and follow-up as needed for any problems.  Final Clinical Impressions(s) / ED Diagnoses   Final diagnoses:  None    New Prescriptions New Prescriptions   No medications on file     Geoffery Lyonselo, Ame Heagle, MD 05/02/17 1014

## 2017-11-22 ENCOUNTER — Emergency Department (HOSPITAL_BASED_OUTPATIENT_CLINIC_OR_DEPARTMENT_OTHER): Payer: Medicare Other

## 2017-11-22 ENCOUNTER — Emergency Department (HOSPITAL_BASED_OUTPATIENT_CLINIC_OR_DEPARTMENT_OTHER)
Admission: EM | Admit: 2017-11-22 | Discharge: 2017-11-22 | Disposition: A | Discharge status: Home / Self Care | Payer: Medicare Other | Attending: Emergency Medicine | Admitting: Emergency Medicine

## 2017-11-22 ENCOUNTER — Other Ambulatory Visit: Payer: Self-pay

## 2017-11-22 ENCOUNTER — Encounter (HOSPITAL_BASED_OUTPATIENT_CLINIC_OR_DEPARTMENT_OTHER): Payer: Self-pay

## 2017-11-22 DIAGNOSIS — Z7984 Long term (current) use of oral hypoglycemic drugs: Secondary | ICD-10-CM | POA: Insufficient documentation

## 2017-11-22 DIAGNOSIS — J45909 Unspecified asthma, uncomplicated: Secondary | ICD-10-CM | POA: Diagnosis not present

## 2017-11-22 DIAGNOSIS — K5792 Diverticulitis of intestine, part unspecified, without perforation or abscess without bleeding: Secondary | ICD-10-CM | POA: Insufficient documentation

## 2017-11-22 DIAGNOSIS — Z87891 Personal history of nicotine dependence: Secondary | ICD-10-CM | POA: Diagnosis not present

## 2017-11-22 DIAGNOSIS — R109 Unspecified abdominal pain: Secondary | ICD-10-CM | POA: Diagnosis present

## 2017-11-22 DIAGNOSIS — I10 Essential (primary) hypertension: Secondary | ICD-10-CM | POA: Diagnosis not present

## 2017-11-22 DIAGNOSIS — Z79899 Other long term (current) drug therapy: Secondary | ICD-10-CM | POA: Insufficient documentation

## 2017-11-22 DIAGNOSIS — E119 Type 2 diabetes mellitus without complications: Secondary | ICD-10-CM | POA: Insufficient documentation

## 2017-11-22 LAB — CBC WITH DIFFERENTIAL/PLATELET
BASOS ABS: 0 10*3/uL (ref 0.0–0.1)
Basophils Relative: 0 %
EOS PCT: 2 %
Eosinophils Absolute: 0.3 10*3/uL (ref 0.0–0.7)
HCT: 37.2 % — ABNORMAL LOW (ref 39.0–52.0)
Hemoglobin: 12.7 g/dL — ABNORMAL LOW (ref 13.0–17.0)
LYMPHS PCT: 17 %
Lymphs Abs: 2.1 10*3/uL (ref 0.7–4.0)
MCH: 32.1 pg (ref 26.0–34.0)
MCHC: 34.1 g/dL (ref 30.0–36.0)
MCV: 93.9 fL (ref 78.0–100.0)
Monocytes Absolute: 1.1 10*3/uL — ABNORMAL HIGH (ref 0.1–1.0)
Monocytes Relative: 9 %
NEUTROS ABS: 8.7 10*3/uL — AB (ref 1.7–7.7)
NEUTROS PCT: 72 %
PLATELETS: 333 10*3/uL (ref 150–400)
RBC: 3.96 MIL/uL — AB (ref 4.22–5.81)
RDW: 12.2 % (ref 11.5–15.5)
WBC: 12.2 10*3/uL — AB (ref 4.0–10.5)

## 2017-11-22 LAB — LIPASE, BLOOD: LIPASE: 38 U/L (ref 11–51)

## 2017-11-22 LAB — URINALYSIS, ROUTINE W REFLEX MICROSCOPIC
Bilirubin Urine: NEGATIVE
GLUCOSE, UA: NEGATIVE mg/dL
Hgb urine dipstick: NEGATIVE
Ketones, ur: NEGATIVE mg/dL
LEUKOCYTES UA: NEGATIVE
Nitrite: NEGATIVE
PROTEIN: NEGATIVE mg/dL
Specific Gravity, Urine: 1.02 (ref 1.005–1.030)
pH: 6.5 (ref 5.0–8.0)

## 2017-11-22 LAB — COMPREHENSIVE METABOLIC PANEL
ALT: 20 U/L (ref 17–63)
ANION GAP: 8 (ref 5–15)
AST: 20 U/L (ref 15–41)
Albumin: 4.2 g/dL (ref 3.5–5.0)
Alkaline Phosphatase: 38 U/L (ref 38–126)
BILIRUBIN TOTAL: 1 mg/dL (ref 0.3–1.2)
BUN: 28 mg/dL — AB (ref 6–20)
CO2: 24 mmol/L (ref 22–32)
Calcium: 9.3 mg/dL (ref 8.9–10.3)
Chloride: 104 mmol/L (ref 101–111)
Creatinine, Ser: 1.39 mg/dL — ABNORMAL HIGH (ref 0.61–1.24)
GFR, EST NON AFRICAN AMERICAN: 52 mL/min — AB (ref >60)
Glucose, Bld: 128 mg/dL — ABNORMAL HIGH (ref 65–99)
POTASSIUM: 4.1 mmol/L (ref 3.5–5.1)
Sodium: 136 mmol/L (ref 135–145)
TOTAL PROTEIN: 7.1 g/dL (ref 6.5–8.1)

## 2017-11-22 MED ORDER — CIPROFLOXACIN HCL 500 MG PO TABS
500.0000 mg | ORAL_TABLET | Freq: Once | ORAL | Status: DC
  Filled 2017-11-22: qty 1

## 2017-11-22 MED ORDER — METRONIDAZOLE 500 MG PO TABS
500.0000 mg | ORAL_TABLET | Freq: Once | ORAL | Status: DC
  Filled 2017-11-22: qty 1

## 2017-11-22 MED ORDER — AMOXICILLIN-POT CLAVULANATE 875-125 MG PO TABS
1.0000 | ORAL_TABLET | Freq: Once | ORAL | Status: AC
  Administered 2017-11-22: 1 via ORAL
  Filled 2017-11-22: qty 1

## 2017-11-22 MED ORDER — IOPAMIDOL (ISOVUE-300) INJECTION 61%
100.0000 mL | Freq: Once | INTRAVENOUS | Status: AC | PRN
  Administered 2017-11-22: 100 mL via INTRAVENOUS

## 2017-11-22 MED ORDER — AMOXICILLIN-POT CLAVULANATE 875-125 MG PO TABS: 1.0000 | ORAL_TABLET | Freq: Two times a day (BID) | ORAL | 0 refills | Status: DC

## 2017-11-22 MED FILL — AMOX-CLAV 875-125 MG TABLET: 875-125 | 7 days supply | Qty: 14 | Fill #0

## 2017-11-22 NOTE — ED Provider Notes (Signed)
[facility] EMERGENCY DEPARTMENT Provider Note   CSN: [id number] Arrival date & time: 11/22/17  1156     History   Chief Complaint Chief Complaint  Patient presents with  . Abdominal Pain    HPI [name] is a 66 y.o. male.  The history is provided by the patient. No language interpreter was used.  Abdominal Pain     [name] is a 66 y.o. male who presents to the Emergency Department complaining of abdominal pain.  He describes several days of lower abdominal pain described as his prior diverticulitis.  Pain is constant in nature.  Pain is worse with meals and he has mild associated nausea.  No fevers, vomiting, dysuria.  He does report mild associated constipation.  Symptoms are moderate, constant, worsening. Past Medical History:  Diagnosis Date  . Arthritis   . Asthma   . Back pain   . Diabetes mellitus   . Diverticular disease   . Environmental allergies   . Hyperlipemia   . Hypertension     There are no active problems to display for this patient.   Past Surgical History:  Procedure Laterality Date  . ACHILLES TENDON REPAIR  1974   left  . COLONOSCOPY    . KIDNEY STONE SURGERY    . KNEE ARTHROSCOPY     rt  . SHOULDER ARTHROSCOPY  8/12   right  . TRIGGER FINGER RELEASE  11/17/2011   Procedure: RELEASE TRIGGER FINGER/A-1 PULLEY;  Surgeon: Wyn Forster., MD;  Location: [facility];  Service: Orthopedics;  Laterality: Left;  RELEASE LEFT LONG TRIGGER FINGER  . TRIGGER FINGER RELEASE  12/12   lt long finger  . TUMOR REMOVAL     rt neck/chin       Home Medications    Prior to Admission medications   Medication Sig Start Date End Date Taking? Authorizing Provider  albuterol (PROVENTIL HFA;VENTOLIN HFA) 108 (90 BASE) MCG/ACT inhaler Inhale 2 puffs into the lungs every 6 (six) hours as needed.    [provider]  allopurinol (ZYLOPRIM) 300 MG tablet Take 300 mg by mouth daily.    [provider]    amLODipine (NORVASC) 5 MG tablet Take 5 mg by mouth daily.    [provider]  amoxicillin-clavulanate (AUGMENTIN) 875-125 MG tablet Take 1 tablet by mouth every 12 (twelve) hours. 11/22/17   [name], MD  aspirin 81 MG tablet Take 81 mg by mouth daily.    [provider]  fenofibrate 160 MG tablet Take 160 mg by mouth daily.    [provider]  HYDROcodone-acetaminophen (NORCO) 5-325 MG tablet Take 1-2 tablets by mouth every 6 (six) hours as needed. 11/16/15   Geoffery Lyons, MD  LISINOPRIL PO Take by mouth.    [provider]  metFORMIN (GLUCOPHAGE) 1000 MG tablet Take 1,000 mg by mouth 2 (two) times daily with a meal.    [provider]  naproxen (NAPROSYN) 500 MG tablet Take 1 tablet (500 mg total) by mouth 2 (two) times daily. 05/12/16   Mabe, [name], MD  rosuvastatin (CRESTOR) 20 MG tablet Take 20 mg by mouth at bedtime.    [provider]    Family History No family history on file.  Social History Social History   Tobacco Use  . Smoking status: Former Smoker    Last attempt to quit: 03/25/2010    Years since quitting: 7.6  . Smokeless tobacco: Never Used  Substance Use Topics  .  Alcohol use: Yes    Comment: occ  . Drug use: No     Allergies   Hibiclens [chlorhexidine gluconate] and Flagyl [metronidazole]   Review of Systems Review of Systems  Gastrointestinal: Positive for abdominal pain.  All other systems reviewed and are negative.    Physical Exam Updated Vital Signs BP 120/70 (BP Location: Left Arm)   Pulse 64   Temp 98.3 F (36.8 C) (Oral)   Resp 18   Ht 5\' 5"  (1.651 m)   Wt 81.6 kg (180 lb)   SpO2 96%   BMI 29.95 kg/m   Physical Exam  Constitutional: He is oriented to person, place, and time. He appears well-developed and well-nourished.  HENT:  Head: Normocephalic and atraumatic.  Cardiovascular: Normal rate and regular rhythm.  No murmur heard. Pulmonary/Chest: Effort normal and  breath sounds normal. No respiratory distress.  Abdominal: Soft. There is tenderness in the right lower quadrant. There is guarding. There is no rebound.  Musculoskeletal: He exhibits no edema or tenderness.  Neurological: He is alert and oriented to person, place, and time.  Skin: Skin is warm and dry.  Psychiatric: He has a normal mood and affect. His behavior is normal.  Nursing note and vitals reviewed.    ED Treatments / Results  Labs (all labs ordered are listed, but only abnormal results are displayed) Labs Reviewed  COMPREHENSIVE METABOLIC PANEL - Abnormal; Notable for the following components:      Result Value   Glucose, Bld 128 (*)    BUN 28 (*)    Creatinine, Ser 1.39 (*)    GFR calc non Af Amer 52 (*)    All other components within normal limits  CBC WITH DIFFERENTIAL/PLATELET - Abnormal; Notable for the following components:   WBC 12.2 (*)    RBC 3.96 (*)    Hemoglobin 12.7 (*)    HCT 37.2 (*)    Neutro Abs 8.7 (*)    Monocytes Absolute 1.1 (*)    All other components within normal limits  URINALYSIS, ROUTINE W REFLEX MICROSCOPIC  LIPASE, BLOOD    EKG  EKG Interpretation None       Radiology Ct Abdomen Pelvis W Contrast  Result Date: 11/22/2017 CLINICAL DATA:  Lower abdominal pain for 3 days EXAM: CT ABDOMEN AND PELVIS WITH CONTRAST TECHNIQUE: Multidetector CT imaging of the abdomen and pelvis was performed using the standard protocol following bolus administration of intravenous contrast. CONTRAST:  100mL ISOVUE-300 IOPAMIDOL (ISOVUE-300) INJECTION 61% COMPARISON:  11/16/2015, 05/11/2015 FINDINGS: Lower chest: Lung bases demonstrate no acute consolidation or effusion. Coronary artery calcification. Small hiatal hernia Hepatobiliary: Heterogenous attenuation, likely due to heterogeneous fat infiltration. No calcified gallstones or biliary dilatation. Small hyperdense foci along the gallbladder fundus re- demonstrated. Pancreas: Unremarkable. No pancreatic  ductal dilatation or surrounding inflammatory changes. Spleen: Normal in size without focal abnormality. Adrenals/Urinary Tract: Adrenal glands are unremarkable. Kidneys are normal, without renal calculi, focal lesion, or hydronephrosis. Bladder is unremarkable. Stomach/Bowel: The stomach is nonenlarged. No dilated small bowel. Normal appendix. Focal wall thickening involving the sigmoid colon with scattered diverticula present. No extraluminal gas or abscess. Vascular/Lymphatic: Moderate aortic atherosclerosis. No aneurysmal dilatation. Nonspecific subcentimeter retroperitoneal lymph nodes. Reproductive: Enlarged prostate with a few calcifications Other: Negative for free air or free fluid Musculoskeletal: Degenerative changes. No acute or suspicious lesion. IMPRESSION: 1. Focal wall thickening involving the sigmoid colon with adjacent inflammatory changes, most likely representing acute diverticulitis, with focal colitis felt less likely. No perforation or abscess at this  time. 2. Heterogenous attenuation of the liver, likely due to heterogeneous fatty infiltration. 3. Prostatomegaly 4. Small hiatal hernia Electronically Signed   By: Jasmine PangKim  Fujinaga M.D.   On: 11/22/2017 15:22    Procedures Procedures (including critical care time)  Medications Ordered in ED Medications  iopamidol (ISOVUE-300) 61 % injection 100 mL (100 mLs Intravenous Contrast Given 11/22/17 1457)  amoxicillin-clavulanate (AUGMENTIN) 875-125 MG per tablet 1 tablet (1 tablet Oral Given 11/22/17 1557)     Initial Impression / Assessment and Plan / ED Course  I have reviewed the triage vital signs and the nursing notes.  Pertinent labs & imaging results that were available during my care of the patient were reviewed by me and considered in my medical decision making (see chart for details).     Patient here for evaluation of lower abdominal pain.  He does have significant right lower quadrant tenderness on examination.  CT abdomen  obtained to evaluate for appendicitis versus diverticulitis.  CT abdomen is consistent with diverticulitis with no evidence of complicating features.  Discussed with patient home care for acute diverticulitis.  He has a possible Flagyl allergy, will treat with Augmentin.  Counseled patient on home care, outpatient follow-up and return precautions.  Final Clinical Impressions(s) / ED Diagnoses   Final diagnoses:  Acute diverticulitis    ED Discharge Orders        Ordered    amoxicillin-clavulanate (AUGMENTIN) 875-125 MG tablet  Every 12 hours     11/22/17 1553       Tilden Fossaees, Jalaiya Oyster, MD 11/22/17 1628

## 2017-11-22 NOTE — ED Triage Notes (Signed)
Lower abdominal pain x 2-3 days. Hx diverticulitis. Sts feels the same. Denies n/v/d.

## 2017-11-26 ENCOUNTER — Other Ambulatory Visit: Payer: Self-pay

## 2017-11-26 ENCOUNTER — Encounter (HOSPITAL_BASED_OUTPATIENT_CLINIC_OR_DEPARTMENT_OTHER): Payer: Self-pay | Admitting: Emergency Medicine

## 2017-11-26 ENCOUNTER — Emergency Department (HOSPITAL_BASED_OUTPATIENT_CLINIC_OR_DEPARTMENT_OTHER)
Admission: EM | Admit: 2017-11-26 | Discharge: 2017-11-26 | Disposition: A | Discharge status: Home / Self Care | Payer: Medicare Other | Attending: Emergency Medicine | Admitting: Emergency Medicine

## 2017-11-26 DIAGNOSIS — Z7984 Long term (current) use of oral hypoglycemic drugs: Secondary | ICD-10-CM | POA: Insufficient documentation

## 2017-11-26 DIAGNOSIS — T7840XA Allergy, unspecified, initial encounter: Secondary | ICD-10-CM

## 2017-11-26 DIAGNOSIS — I10 Essential (primary) hypertension: Secondary | ICD-10-CM | POA: Insufficient documentation

## 2017-11-26 DIAGNOSIS — E119 Type 2 diabetes mellitus without complications: Secondary | ICD-10-CM | POA: Diagnosis not present

## 2017-11-26 DIAGNOSIS — Z79899 Other long term (current) drug therapy: Secondary | ICD-10-CM | POA: Diagnosis not present

## 2017-11-26 DIAGNOSIS — R21 Rash and other nonspecific skin eruption: Secondary | ICD-10-CM | POA: Diagnosis present

## 2017-11-26 DIAGNOSIS — T360X5A Adverse effect of penicillins, initial encounter: Secondary | ICD-10-CM | POA: Insufficient documentation

## 2017-11-26 DIAGNOSIS — E785 Hyperlipidemia, unspecified: Secondary | ICD-10-CM | POA: Insufficient documentation

## 2017-11-26 DIAGNOSIS — Y929 Unspecified place or not applicable: Secondary | ICD-10-CM | POA: Insufficient documentation

## 2017-11-26 DIAGNOSIS — L270 Generalized skin eruption due to drugs and medicaments taken internally: Secondary | ICD-10-CM | POA: Insufficient documentation

## 2017-11-26 DIAGNOSIS — J45909 Unspecified asthma, uncomplicated: Secondary | ICD-10-CM | POA: Insufficient documentation

## 2017-11-26 DIAGNOSIS — Z87891 Personal history of nicotine dependence: Secondary | ICD-10-CM | POA: Insufficient documentation

## 2017-11-26 MED ORDER — PREDNISONE 10 MG PO TABS: 50.0000 mg | ORAL_TABLET | Freq: Every day | ORAL | 0 refills | Status: AC

## 2017-11-26 MED ORDER — PREDNISONE 50 MG PO TABS
50.0000 mg | ORAL_TABLET | Freq: Once | ORAL | Status: AC
  Administered 2017-11-26: 50 mg via ORAL
  Filled 2017-11-26: qty 1

## 2017-11-26 MED ORDER — CIPROFLOXACIN HCL 500 MG PO TABS: 500.0000 mg | ORAL_TABLET | Freq: Two times a day (BID) | ORAL | 0 refills | Status: AC

## 2017-11-26 MED FILL — CIPROFLOXACIN HCL 500 MG TA: 500 | 3 days supply | Qty: 6 | Fill #0

## 2017-11-26 MED FILL — predniSONE 10 MG TABS: 10 | 4 days supply | Qty: 20 | Fill #0

## 2017-11-26 NOTE — ED Notes (Signed)
ED Provider at bedside. 

## 2017-11-26 NOTE — Discharge Instructions (Signed)
Please stop taking Augmentin.  Start taking ciprofloxacin this evening the next 3 days complete your course for diverticulitis.  Please continue to take Benadryl at home for hives and itching.  You have also been started on steroids.  You can take your next dose tomorrow.  Please return without fail for worsening symptoms, including fever, severe abdominal pain, intractable vomiting, difficulty breathing, swelling of the tongue or throat, or any other symptoms concerning to you.

## 2017-11-26 NOTE — ED Triage Notes (Addendum)
"   I was here a couple days ago and I have a rash and itching and I think I am allergic to the antibiotics I am taking or the from CT Scan" Denies throat swelling or SOB . Has been taking Augmentin since 12/20 and taking benadryl with it. Last dose of Benadryl last night.

## 2017-11-26 NOTE — ED Provider Notes (Addendum)
[facility] EMERGENCY DEPARTMENT Provider Note   CSN: [id number] Arrival date & time: 11/26/17  [id number]     History   Chief Complaint Chief Complaint  Patient presents with  . Allergic Reaction    HPI [name] is a 66 y.o. male.  HPI 66 year old male who presents with rash. History of HTN, HLD, DM. Seen in ED 11/22/2017 for abdominal pain. Diagnosed with uncomplicated diverticulitis on CT scan. Due to flagyl allergy he was prescribed Augmentin. The day after he developed itching rash to torso and extremities. Continue to take Augmentin, but symptoms not improved. Denies fever, abdominal pain, nausea, vomiting, diarrhea, dyspnea, throat or tongue swelling or chest pain. Took benadryl with only temporary relief.   Past Medical History:  Diagnosis Date  . Arthritis   . Asthma   . Back pain   . Diabetes mellitus   . Diverticular disease   . Environmental allergies   . Hyperlipemia   . Hypertension     There are no active problems to display for this patient.   Past Surgical History:  Procedure Laterality Date  . ACHILLES TENDON REPAIR  1974   left  . COLONOSCOPY    . KIDNEY STONE SURGERY    . KNEE ARTHROSCOPY     rt  . SHOULDER ARTHROSCOPY  8/12   right  . TRIGGER FINGER RELEASE  11/17/2011   Procedure: RELEASE TRIGGER FINGER/A-1 PULLEY;  Surgeon: Wyn Forsterobert V Sypher Jr., MD;  Location: [facility];  Service: Orthopedics;  Laterality: Left;  RELEASE LEFT LONG TRIGGER FINGER  . TRIGGER FINGER RELEASE  12/12   lt long finger  . TUMOR REMOVAL     rt neck/chin       Home Medications    Prior to Admission medications   Medication Sig Start Date End Date Taking? Authorizing Provider  albuterol (PROVENTIL HFA;VENTOLIN HFA) 108 (90 BASE) MCG/ACT inhaler Inhale 2 puffs into the lungs every 6 (six) hours as needed.   Yes [provider]  allopurinol (ZYLOPRIM) 300 MG tablet Take 300 mg by mouth daily.   Yes [provider]    amLODipine (NORVASC) 5 MG tablet Take 5 mg by mouth daily.   Yes [provider]  amoxicillin-clavulanate (AUGMENTIN) 875-125 MG tablet Take 1 tablet by mouth every 12 (twelve) hours. 11/22/17  Yes Tilden Fossaees, Elizabeth, MD  aspirin 81 MG tablet Take 81 mg by mouth daily.   Yes [provider]  fenofibrate 160 MG tablet Take 160 mg by mouth daily.   Yes [provider]  LISINOPRIL PO Take by mouth.   Yes [provider]  metFORMIN (GLUCOPHAGE) 1000 MG tablet Take 1,000 mg by mouth 2 (two) times daily with a meal.   Yes [provider]  rosuvastatin (CRESTOR) 20 MG tablet Take 20 mg by mouth at bedtime.   Yes [provider]  HYDROcodone-acetaminophen (NORCO) 5-325 MG tablet Take 1-2 tablets by mouth every 6 (six) hours as needed. 11/16/15   Geoffery Lyonselo, Douglas, MD  naproxen (NAPROSYN) 500 MG tablet Take 1 tablet (500 mg total) by mouth 2 (two) times daily. 05/12/16   Mabe, Latanya MaudlinMartha L, MD    Family History No family history on file.  Social History Social History   Tobacco Use  . Smoking status: Former Smoker    Last attempt to quit: 03/25/2010    Years since quitting: 7.6  . Smokeless tobacco: Never Used  Substance Use Topics  . Alcohol use: Yes  Comment: occ  . Drug use: No     Allergies   Hibiclens [chlorhexidine gluconate]; Flagyl [metronidazole]; and Augmentin [amoxicillin-pot clavulanate]   Review of Systems Review of Systems  Constitutional: Negative for fever.  HENT: Negative for trouble swallowing.   Respiratory: Negative for shortness of breath.   Cardiovascular: Negative for chest pain.  Gastrointestinal: Negative for abdominal pain.  Skin: Positive for rash.     Physical Exam Updated Vital Signs BP (!) 204/86 (BP Location: Right Arm)   Pulse 78   Temp 98 F (36.7 C) (Oral)   Resp 18   Ht 5\' 5"  (1.651 m)   Wt 81.6 kg (180 lb)   SpO2 98%   BMI 29.95 kg/m   Physical Exam Physical Exam  Nursing note and vitals  reviewed. Constitutional: Well developed, well nourished, non-toxic, and in no acute distress Head: Normocephalic and atraumatic.  Mouth/Throat: Oropharynx is clear and moist.  Neck: Normal range of motion. Neck supple.  Cardiovascular: Normal rate and regular rhythm.   Pulmonary/Chest: Effort normal and breath sounds normal.  Abdominal: Soft. There is no tenderness. There is no rebound and no guarding.  Musculoskeletal: Normal range of motion.  Neurological: Alert, no facial droop, fluent speech, moves all extremities symmetrically Skin: Skin is warm and dry. Urticarial rash involving the torso.  No mucous membrane involvement. Psychiatric: Cooperative   ED Treatments / Results  Labs (all labs ordered are listed, but only abnormal results are displayed) Labs Reviewed - No data to display  EKG  EKG Interpretation None       Radiology No results found.  Procedures Procedures (including critical care time)  Medications Ordered in ED Medications  predniSONE (DELTASONE) tablet 50 mg (not administered)     Initial Impression / Assessment and Plan / ED Course  I have reviewed the triage vital signs and the nursing notes.  Pertinent labs & imaging results that were available during my care of the patient were reviewed by me and considered in my medical decision making (see chart for details).     Patient is well-appearing in no acute distress.  Is hypertensive but has not taken his blood pressure medications.  He can take this at home.  His abdomen is soft and benign, and symptoms of diverticulitis has been improving on Augmentin.  However he does appear to have allergy to Augmentin.  He also of note has allergy to Flagyl, which she reports that he did have a rash associated with it several years ago.  I have reviewed this patient with our ED pharmacist.  At this time he recommends monotherapy with ciprofloxacin is the best option.  To be continued for the next 3 days to  finish his week long course.  Patient will be treated for allergic reaction with steroids and Benadryl.  He is also to be started on ciprofloxacin and to discontinue Augmentin. Strict return and follow-up instructions reviewed. He expressed understanding of all discharge instructions and felt comfortable with the plan of care.   Final Clinical Impressions(s) / ED Diagnoses   Final diagnoses:  Allergic reaction, initial encounter    ED Discharge Orders    None       Lavera GuiseLiu, Dana Duo, MD 11/26/17 1018    Lavera GuiseLiu, Dana Duo, MD 11/26/17 1020

## 2018-05-17 ENCOUNTER — Encounter (HOSPITAL_COMMUNITY): Admission: EM | Disposition: A | Discharge status: Home / Self Care | Payer: Self-pay

## 2018-05-17 ENCOUNTER — Inpatient Hospital Stay (HOSPITAL_COMMUNITY): Payer: Medicare Other | Admitting: Anesthesiology

## 2018-05-17 ENCOUNTER — Encounter (HOSPITAL_BASED_OUTPATIENT_CLINIC_OR_DEPARTMENT_OTHER): Payer: Self-pay | Admitting: Emergency Medicine

## 2018-05-17 ENCOUNTER — Inpatient Hospital Stay (HOSPITAL_BASED_OUTPATIENT_CLINIC_OR_DEPARTMENT_OTHER)
Admission: EM | Admit: 2018-05-17 | Discharge: 2018-05-22 | DRG: 331 | Disposition: A | Discharge status: Home / Self Care | Payer: Medicare Other | Source: Other Acute Inpatient Hospital | Attending: Surgery | Admitting: Surgery

## 2018-05-17 ENCOUNTER — Emergency Department (HOSPITAL_BASED_OUTPATIENT_CLINIC_OR_DEPARTMENT_OTHER): Payer: Medicare Other

## 2018-05-17 ENCOUNTER — Other Ambulatory Visit: Payer: Self-pay

## 2018-05-17 DIAGNOSIS — J209 Acute bronchitis, unspecified: Secondary | ICD-10-CM | POA: Diagnosis present

## 2018-05-17 DIAGNOSIS — K668 Other specified disorders of peritoneum: Secondary | ICD-10-CM | POA: Diagnosis present

## 2018-05-17 DIAGNOSIS — E119 Type 2 diabetes mellitus without complications: Secondary | ICD-10-CM | POA: Diagnosis present

## 2018-05-17 DIAGNOSIS — Z87442 Personal history of urinary calculi: Secondary | ICD-10-CM

## 2018-05-17 DIAGNOSIS — K5720 Diverticulitis of large intestine with perforation and abscess without bleeding: Secondary | ICD-10-CM | POA: Diagnosis present

## 2018-05-17 DIAGNOSIS — Z7984 Long term (current) use of oral hypoglycemic drugs: Secondary | ICD-10-CM | POA: Diagnosis not present

## 2018-05-17 DIAGNOSIS — J45909 Unspecified asthma, uncomplicated: Secondary | ICD-10-CM | POA: Diagnosis present

## 2018-05-17 DIAGNOSIS — Z888 Allergy status to other drugs, medicaments and biological substances status: Secondary | ICD-10-CM | POA: Diagnosis not present

## 2018-05-17 DIAGNOSIS — Z881 Allergy status to other antibiotic agents status: Secondary | ICD-10-CM | POA: Diagnosis not present

## 2018-05-17 DIAGNOSIS — Z880 Allergy status to penicillin: Secondary | ICD-10-CM | POA: Diagnosis not present

## 2018-05-17 DIAGNOSIS — K5790 Diverticulosis of intestine, part unspecified, without perforation or abscess without bleeding: Secondary | ICD-10-CM

## 2018-05-17 DIAGNOSIS — I10 Essential (primary) hypertension: Secondary | ICD-10-CM | POA: Diagnosis present

## 2018-05-17 DIAGNOSIS — K660 Peritoneal adhesions (postprocedural) (postinfection): Secondary | ICD-10-CM | POA: Diagnosis present

## 2018-05-17 DIAGNOSIS — E785 Hyperlipidemia, unspecified: Secondary | ICD-10-CM | POA: Insufficient documentation

## 2018-05-17 DIAGNOSIS — Z9889 Other specified postprocedural states: Secondary | ICD-10-CM

## 2018-05-17 DIAGNOSIS — M109 Gout, unspecified: Secondary | ICD-10-CM | POA: Diagnosis present

## 2018-05-17 DIAGNOSIS — R1031 Right lower quadrant pain: Secondary | ICD-10-CM | POA: Diagnosis present

## 2018-05-17 DIAGNOSIS — Z79899 Other long term (current) drug therapy: Secondary | ICD-10-CM

## 2018-05-17 DIAGNOSIS — K439 Ventral hernia without obstruction or gangrene: Secondary | ICD-10-CM | POA: Diagnosis present

## 2018-05-17 DIAGNOSIS — Z79891 Long term (current) use of opiate analgesic: Secondary | ICD-10-CM

## 2018-05-17 DIAGNOSIS — Z87891 Personal history of nicotine dependence: Secondary | ICD-10-CM | POA: Diagnosis not present

## 2018-05-17 DIAGNOSIS — Z8719 Personal history of other diseases of the digestive system: Secondary | ICD-10-CM

## 2018-05-17 DIAGNOSIS — Z7982 Long term (current) use of aspirin: Secondary | ICD-10-CM

## 2018-05-17 DIAGNOSIS — Z791 Long term (current) use of non-steroidal anti-inflammatories (NSAID): Secondary | ICD-10-CM | POA: Diagnosis not present

## 2018-05-17 DIAGNOSIS — K5740 Diverticulitis of both small and large intestine with perforation and abscess without bleeding: Secondary | ICD-10-CM

## 2018-05-17 HISTORY — DX: Personal history of other diseases of the digestive system: Z87.19

## 2018-05-17 HISTORY — PX: COLON RESECTION: SHX5231

## 2018-05-17 LAB — TYPE AND SCREEN
ABO/RH(D): O POS
ANTIBODY SCREEN: NEGATIVE

## 2018-05-17 LAB — URINALYSIS, ROUTINE W REFLEX MICROSCOPIC
Bilirubin Urine: NEGATIVE
GLUCOSE, UA: NEGATIVE mg/dL
Hgb urine dipstick: NEGATIVE
Ketones, ur: NEGATIVE mg/dL
Leukocytes, UA: NEGATIVE
NITRITE: NEGATIVE
PH: 6 (ref 5.0–8.0)
Protein, ur: NEGATIVE mg/dL
SPECIFIC GRAVITY, URINE: 1.025 (ref 1.005–1.030)

## 2018-05-17 LAB — GLUCOSE, CAPILLARY
GLUCOSE-CAPILLARY: 166 mg/dL — AB (ref 65–99)
GLUCOSE-CAPILLARY: 165 mg/dL — AB (ref 65–99)
Glucose-Capillary: 160 mg/dL — ABNORMAL HIGH (ref 65–99)

## 2018-05-17 LAB — BASIC METABOLIC PANEL
Anion gap: 9 (ref 5–15)
BUN: 20 mg/dL (ref 6–20)
CALCIUM: 9 mg/dL (ref 8.9–10.3)
CHLORIDE: 102 mmol/L (ref 101–111)
CO2: 28 mmol/L (ref 22–32)
Creatinine, Ser: 1.14 mg/dL (ref 0.61–1.24)
GFR calc non Af Amer: >60 mL/min (ref >60)
GLUCOSE: 124 mg/dL — AB (ref 65–99)
Potassium: 4 mmol/L (ref 3.5–5.1)
Sodium: 139 mmol/L (ref 135–145)

## 2018-05-17 LAB — CBC WITH DIFFERENTIAL/PLATELET
BASOS PCT: 0 %
Basophils Absolute: 0 10*3/uL (ref 0.0–0.1)
Eosinophils Absolute: 0.1 10*3/uL (ref 0.0–0.7)
Eosinophils Relative: 1 %
HEMATOCRIT: 41.4 % (ref 39.0–52.0)
HEMOGLOBIN: 14.1 g/dL (ref 13.0–17.0)
LYMPHS ABS: 2.3 10*3/uL (ref 0.7–4.0)
LYMPHS PCT: 17 %
MCH: 31.3 pg (ref 26.0–34.0)
MCHC: 34.1 g/dL (ref 30.0–36.0)
MCV: 92 fL (ref 78.0–100.0)
MONO ABS: 0.9 10*3/uL (ref 0.1–1.0)
MONOS PCT: 7 %
NEUTROS ABS: 10.5 10*3/uL — AB (ref 1.7–7.7)
NEUTROS PCT: 75 %
Platelets: 315 10*3/uL (ref 150–400)
RBC: 4.5 MIL/uL (ref 4.22–5.81)
RDW: 13 % (ref 11.5–15.5)
WBC: 13.8 10*3/uL — ABNORMAL HIGH (ref 4.0–10.5)

## 2018-05-17 LAB — ABO/RH: ABO/RH(D): O POS

## 2018-05-17 LAB — I-STAT CG4 LACTIC ACID, ED: Lactic Acid, Venous: 0.76 mmol/L (ref 0.5–1.9)

## 2018-05-17 SURGERY — COLON RESECTION
Anesthesia: General | Site: Abdomen

## 2018-05-17 MED ORDER — SUGAMMADEX SODIUM 500 MG/5ML IV SOLN
INTRAVENOUS | Status: DC | PRN
  Administered 2018-05-17: 200 mg via INTRAVENOUS

## 2018-05-17 MED ORDER — DIPHENHYDRAMINE HCL 12.5 MG/5ML PO ELIX: 12.5000 mg | ORAL_SOLUTION | Freq: Four times a day (QID) | ORAL | Status: DC | PRN

## 2018-05-17 MED ORDER — ROCURONIUM BROMIDE 10 MG/ML (PF) SYRINGE
PREFILLED_SYRINGE | INTRAVENOUS | Status: DC | PRN
  Administered 2018-05-17: 50 mg via INTRAVENOUS
  Administered 2018-05-17: 10 mg via INTRAVENOUS

## 2018-05-17 MED ORDER — MORPHINE SULFATE (PF) 4 MG/ML IV SOLN
4.0000 mg | Freq: Once | INTRAVENOUS | Status: AC
  Administered 2018-05-17: 4 mg via INTRAVENOUS
  Filled 2018-05-17: qty 1

## 2018-05-17 MED ORDER — DIPHENHYDRAMINE HCL 50 MG/ML IJ SOLN: 12.5000 mg | Freq: Four times a day (QID) | INTRAMUSCULAR | Status: DC | PRN

## 2018-05-17 MED ORDER — ONDANSETRON 4 MG PO TBDP: 4.0000 mg | ORAL_TABLET | Freq: Four times a day (QID) | ORAL | Status: DC | PRN

## 2018-05-17 MED ORDER — 0.9 % SODIUM CHLORIDE (POUR BTL) OPTIME
TOPICAL | Status: DC | PRN
  Administered 2018-05-17: 4000 mL

## 2018-05-17 MED ORDER — ONDANSETRON HCL 4 MG/2ML IJ SOLN
4.0000 mg | Freq: Four times a day (QID) | INTRAMUSCULAR | Status: DC | PRN
  Administered 2018-05-17: 4 mg via INTRAVENOUS

## 2018-05-17 MED ORDER — LACTATED RINGERS IV SOLN
INTRAVENOUS | Status: DC | PRN
  Administered 2018-05-17 (×2): via INTRAVENOUS

## 2018-05-17 MED ORDER — DEXAMETHASONE SODIUM PHOSPHATE 10 MG/ML IJ SOLN
INTRAMUSCULAR | Status: AC
  Filled 2018-05-17: qty 1

## 2018-05-17 MED ORDER — PIPERACILLIN-TAZOBACTAM 3.375 G IVPB
3.3750 g | Freq: Three times a day (TID) | INTRAVENOUS | Status: DC
  Administered 2018-05-17 – 2018-05-22 (×15): 3.375 g via INTRAVENOUS
  Filled 2018-05-17 (×15): qty 50

## 2018-05-17 MED ORDER — MIDAZOLAM HCL 5 MG/5ML IJ SOLN
INTRAMUSCULAR | Status: DC | PRN
  Administered 2018-05-17: 2 mg via INTRAVENOUS

## 2018-05-17 MED ORDER — MIDAZOLAM HCL 2 MG/2ML IJ SOLN
INTRAMUSCULAR | Status: AC
  Filled 2018-05-17: qty 2

## 2018-05-17 MED ORDER — ALBUTEROL SULFATE (2.5 MG/3ML) 0.083% IN NEBU: 2.5000 mg | INHALATION_SOLUTION | Freq: Four times a day (QID) | RESPIRATORY_TRACT | Status: DC | PRN

## 2018-05-17 MED ORDER — PROPOFOL 10 MG/ML IV BOLUS
INTRAVENOUS | Status: DC | PRN
  Administered 2018-05-17: 120 mg via INTRAVENOUS

## 2018-05-17 MED ORDER — LIDOCAINE 2% (20 MG/ML) 5 ML SYRINGE
INTRAMUSCULAR | Status: AC
  Filled 2018-05-17: qty 5

## 2018-05-17 MED ORDER — HYDROMORPHONE 1 MG/ML IV SOLN
INTRAVENOUS | Status: DC
  Administered 2018-05-17 (×2): 0.3 mg via INTRAVENOUS
  Administered 2018-05-18: 1.2 mg via INTRAVENOUS
  Administered 2018-05-18: 0.6 mg via INTRAVENOUS
  Administered 2018-05-18: 1.2 mg via INTRAVENOUS
  Administered 2018-05-19: 0 mg via INTRAVENOUS
  Administered 2018-05-19: 0.6 mg via INTRAVENOUS
  Administered 2018-05-19: 0.3 mg via INTRAVENOUS
  Administered 2018-05-19: 0.6 mg via INTRAVENOUS
  Administered 2018-05-20 (×2): 0.3 mg via INTRAVENOUS
  Filled 2018-05-17: qty 25

## 2018-05-17 MED ORDER — FAMOTIDINE IN NACL 20-0.9 MG/50ML-% IV SOLN
20.0000 mg | Freq: Two times a day (BID) | INTRAVENOUS | Status: DC
  Administered 2018-05-17 – 2018-05-21 (×9): 20 mg via INTRAVENOUS
  Filled 2018-05-17 (×10): qty 50

## 2018-05-17 MED ORDER — SUCCINYLCHOLINE CHLORIDE 200 MG/10ML IV SOSY
PREFILLED_SYRINGE | INTRAVENOUS | Status: AC
  Filled 2018-05-17: qty 10

## 2018-05-17 MED ORDER — INSULIN ASPART 100 UNIT/ML ~~LOC~~ SOLN: 0.0000 [IU] | Freq: Every day | SUBCUTANEOUS | Status: DC

## 2018-05-17 MED ORDER — HYDRALAZINE HCL 20 MG/ML IJ SOLN
10.0000 mg | INTRAMUSCULAR | Status: DC | PRN
  Administered 2018-05-19 – 2018-05-20 (×2): 10 mg via INTRAVENOUS
  Filled 2018-05-17 (×2): qty 1

## 2018-05-17 MED ORDER — SUGAMMADEX SODIUM 200 MG/2ML IV SOLN
INTRAVENOUS | Status: AC
  Filled 2018-05-17: qty 2

## 2018-05-17 MED ORDER — FENTANYL CITRATE (PF) 100 MCG/2ML IJ SOLN
100.0000 ug | Freq: Once | INTRAMUSCULAR | Status: AC
  Administered 2018-05-17: 100 ug via INTRAVENOUS
  Filled 2018-05-17: qty 2

## 2018-05-17 MED ORDER — ACETAMINOPHEN 500 MG PO TABS: 1000.0000 mg | ORAL_TABLET | Freq: Three times a day (TID) | ORAL | Status: DC

## 2018-05-17 MED ORDER — NALOXONE HCL 0.4 MG/ML IJ SOLN: 0.4000 mg | INTRAMUSCULAR | Status: DC | PRN

## 2018-05-17 MED ORDER — OXYCODONE HCL 5 MG PO TABS
5.0000 mg | ORAL_TABLET | ORAL | Status: DC | PRN
  Administered 2018-05-20 (×2): 5 mg via ORAL
  Administered 2018-05-21 – 2018-05-22 (×4): 10 mg via ORAL
  Filled 2018-05-17 (×2): qty 2
  Filled 2018-05-17: qty 1
  Filled 2018-05-17 (×2): qty 2
  Filled 2018-05-17: qty 1

## 2018-05-17 MED ORDER — POLYETHYLENE GLYCOL 3350 17 G PO PACK: 17.0000 g | PACK | Freq: Every day | ORAL | Status: DC | PRN

## 2018-05-17 MED ORDER — SODIUM CHLORIDE 0.9 % IV SOLN
1.0000 g | Freq: Three times a day (TID) | INTRAVENOUS | Status: DC
  Administered 2018-05-17: 1 g via INTRAVENOUS
  Filled 2018-05-17 (×4): qty 1

## 2018-05-17 MED ORDER — LACTATED RINGERS IV SOLN
INTRAVENOUS | Status: DC
  Administered 2018-05-17: 08:00:00 via INTRAVENOUS

## 2018-05-17 MED ORDER — SODIUM CHLORIDE 0.9 % IV BOLUS
1000.0000 mL | Freq: Once | INTRAVENOUS | Status: AC
  Administered 2018-05-17: 1000 mL via INTRAVENOUS

## 2018-05-17 MED ORDER — POTASSIUM CHLORIDE IN NACL 20-0.9 MEQ/L-% IV SOLN
INTRAVENOUS | Status: DC
  Administered 2018-05-17 – 2018-05-19 (×5): via INTRAVENOUS
  Administered 2018-05-21: 1000 mL via INTRAVENOUS
  Administered 2018-05-22: 50 mL/h via INTRAVENOUS
  Filled 2018-05-17 (×9): qty 1000

## 2018-05-17 MED ORDER — EPHEDRINE SULFATE-NACL 50-0.9 MG/10ML-% IV SOSY
PREFILLED_SYRINGE | INTRAVENOUS | Status: DC | PRN
  Administered 2018-05-17: 5 mg via INTRAVENOUS

## 2018-05-17 MED ORDER — LIDOCAINE 2% (20 MG/ML) 5 ML SYRINGE
INTRAMUSCULAR | Status: DC | PRN
  Administered 2018-05-17: 100 mg via INTRAVENOUS

## 2018-05-17 MED ORDER — SODIUM CHLORIDE 0.9% FLUSH: 9.0000 mL | INTRAVENOUS | Status: DC | PRN

## 2018-05-17 MED ORDER — HYDROMORPHONE HCL 1 MG/ML IJ SOLN
0.2500 mg | INTRAMUSCULAR | Status: DC | PRN
  Administered 2018-05-17: 0.5 mg via INTRAVENOUS
  Administered 2018-05-17: 0.25 mg via INTRAVENOUS
  Administered 2018-05-17: 0.5 mg via INTRAVENOUS
  Administered 2018-05-17: 0.25 mg via INTRAVENOUS

## 2018-05-17 MED ORDER — HYDROMORPHONE HCL 1 MG/ML IJ SOLN
INTRAMUSCULAR | Status: AC
  Filled 2018-05-17: qty 1

## 2018-05-17 MED ORDER — SUCCINYLCHOLINE CHLORIDE 200 MG/10ML IV SOSY
PREFILLED_SYRINGE | INTRAVENOUS | Status: DC | PRN
  Administered 2018-05-17: 140 mg via INTRAVENOUS

## 2018-05-17 MED ORDER — FENTANYL CITRATE (PF) 100 MCG/2ML IJ SOLN
INTRAMUSCULAR | Status: AC
  Filled 2018-05-17: qty 2

## 2018-05-17 MED ORDER — SUGAMMADEX SODIUM 500 MG/5ML IV SOLN
INTRAVENOUS | Status: AC
  Filled 2018-05-17: qty 5

## 2018-05-17 MED ORDER — PROPOFOL 10 MG/ML IV BOLUS
INTRAVENOUS | Status: AC
Start: 2018-05-17 — End: ?
  Filled 2018-05-17: qty 40

## 2018-05-17 MED ORDER — LISINOPRIL 20 MG PO TABS: 40.0000 mg | ORAL_TABLET | Freq: Every day | ORAL | Status: DC

## 2018-05-17 MED ORDER — ONDANSETRON HCL 4 MG/2ML IJ SOLN: 4.0000 mg | Freq: Once | INTRAMUSCULAR | Status: DC | PRN

## 2018-05-17 MED ORDER — ONDANSETRON HCL 4 MG/2ML IJ SOLN
INTRAMUSCULAR | Status: AC
  Filled 2018-05-17: qty 2

## 2018-05-17 MED ORDER — ENOXAPARIN SODIUM 40 MG/0.4ML ~~LOC~~ SOLN
40.0000 mg | SUBCUTANEOUS | Status: DC
Start: 2018-05-18 — End: 2018-05-22
  Administered 2018-05-18 – 2018-05-22 (×5): 40 mg via SUBCUTANEOUS
  Filled 2018-05-17 (×5): qty 0.4

## 2018-05-17 MED ORDER — METHOCARBAMOL 1000 MG/10ML IJ SOLN
1000.0000 mg | Freq: Three times a day (TID) | INTRAVENOUS | Status: DC | PRN
  Administered 2018-05-21: 1000 mg via INTRAVENOUS
  Filled 2018-05-17 (×2): qty 10

## 2018-05-17 MED ORDER — MENTHOL 3 MG MT LOZG
1.0000 | LOZENGE | OROMUCOSAL | Status: DC | PRN
  Administered 2018-05-19: 3 mg via ORAL
  Filled 2018-05-17 (×3): qty 9

## 2018-05-17 MED ORDER — GLYCOPYRROLATE PF 0.2 MG/ML IJ SOSY
PREFILLED_SYRINGE | INTRAMUSCULAR | Status: DC | PRN
Start: 2018-05-17 — End: 2018-05-17
  Administered 2018-05-17: .3 mg via INTRAVENOUS

## 2018-05-17 MED ORDER — ROCURONIUM BROMIDE 10 MG/ML (PF) SYRINGE
PREFILLED_SYRINGE | INTRAVENOUS | Status: AC
  Filled 2018-05-17: qty 5

## 2018-05-17 MED ORDER — FENTANYL CITRATE (PF) 100 MCG/2ML IJ SOLN
INTRAMUSCULAR | Status: DC | PRN
  Administered 2018-05-17 (×2): 50 ug via INTRAVENOUS
  Administered 2018-05-17 (×2): 25 ug via INTRAVENOUS
  Administered 2018-05-17: 150 ug via INTRAVENOUS

## 2018-05-17 MED ORDER — MEPERIDINE HCL 50 MG/ML IJ SOLN: 6.2500 mg | INTRAMUSCULAR | Status: DC | PRN

## 2018-05-17 MED ORDER — FENTANYL CITRATE (PF) 250 MCG/5ML IJ SOLN
INTRAMUSCULAR | Status: AC
  Filled 2018-05-17: qty 5

## 2018-05-17 MED ORDER — METHOCARBAMOL 500 MG PO TABS: 750.0000 mg | ORAL_TABLET | Freq: Three times a day (TID) | ORAL | Status: DC | PRN

## 2018-05-17 MED ORDER — INSULIN ASPART 100 UNIT/ML ~~LOC~~ SOLN
0.0000 [IU] | Freq: Three times a day (TID) | SUBCUTANEOUS | Status: DC
  Administered 2018-05-17: 3 [IU] via SUBCUTANEOUS
  Administered 2018-05-18 (×2): 2 [IU] via SUBCUTANEOUS
  Administered 2018-05-18: 3 [IU] via SUBCUTANEOUS
  Administered 2018-05-21 (×2): 2 [IU] via SUBCUTANEOUS
  Administered 2018-05-22: 3 [IU] via SUBCUTANEOUS

## 2018-05-17 MED ORDER — DEXAMETHASONE SODIUM PHOSPHATE 10 MG/ML IJ SOLN
INTRAMUSCULAR | Status: DC | PRN
  Administered 2018-05-17: 10 mg via INTRAVENOUS

## 2018-05-17 MED ORDER — HYDROMORPHONE HCL 1 MG/ML IJ SOLN: 1.0000 mg | INTRAMUSCULAR | Status: DC | PRN

## 2018-05-17 SURGICAL SUPPLY — 46 items
APPLICATOR COTTON TIP 6IN STRL (MISCELLANEOUS) IMPLANT
BLADE EXTENDED COATED 6.5IN (ELECTRODE) ×3 IMPLANT
BLADE HEX COATED 2.75 (ELECTRODE) ×3 IMPLANT
BNDG GAUZE ELAST 4 BULKY (GAUZE/BANDAGES/DRESSINGS) ×3 IMPLANT
CHLORAPREP W/TINT 26ML (MISCELLANEOUS) IMPLANT
CLIP VESOCCLUDE LG 6/CT (CLIP) IMPLANT
COVER SURGICAL LIGHT HANDLE (MISCELLANEOUS) ×6 IMPLANT
DRAPE LAPAROSCOPIC ABDOMINAL (DRAPES) IMPLANT
ELECT REM PT RETURN 15FT ADLT (MISCELLANEOUS) ×3 IMPLANT
EVACUATOR DRAINAGE 10X20 100CC (DRAIN) IMPLANT
EVACUATOR SILICONE 100CC (DRAIN)
GAUZE SPONGE 4X4 12PLY STRL (GAUZE/BANDAGES/DRESSINGS) ×3 IMPLANT
GLOVE BIOGEL PI IND STRL 7.0 (GLOVE) ×1 IMPLANT
GLOVE BIOGEL PI INDICATOR 7.0 (GLOVE) ×2
GLOVE SURG ORTHO 8.0 STRL STRW (GLOVE) ×6 IMPLANT
GOWN STRL REUS W/TWL LRG LVL3 (GOWN DISPOSABLE) ×12 IMPLANT
GOWN STRL REUS W/TWL XL LVL3 (GOWN DISPOSABLE) ×12 IMPLANT
LEGGING LITHOTOMY PAIR STRL (DRAPES) IMPLANT
LIGASURE IMPACT 36 18CM CVD LR (INSTRUMENTS) ×3 IMPLANT
NS IRRIG 1000ML POUR BTL (IV SOLUTION) ×6 IMPLANT
PACK COLON (CUSTOM PROCEDURE TRAY) ×3 IMPLANT
PACK GENERAL/GYN (CUSTOM PROCEDURE TRAY) IMPLANT
PAD ABD 7.5X8 STRL (GAUZE/BANDAGES/DRESSINGS) ×3 IMPLANT
POUCH OSTOMY FLEX CONVEX 2-1/8 (OSTOMY) ×3 IMPLANT
SHEARS HARMONIC ACE PLUS 36CM (ENDOMECHANICALS) IMPLANT
STAPLER CUT CVD 40MM BLUE (STAPLE) ×3 IMPLANT
STAPLER PROXIMATE 75MM BLUE (STAPLE) ×3 IMPLANT
STAPLER VISISTAT 35W (STAPLE) ×3 IMPLANT
SUT ETHILON 3 0 PS 1 (SUTURE) IMPLANT
SUT NOV 1 T60/GS (SUTURE) IMPLANT
SUT NOVA NAB DX-16 0-1 5-0 T12 (SUTURE) ×12 IMPLANT
SUT NOVA T20/GS 25 (SUTURE) IMPLANT
SUT PROLENE 2 0 SH DA (SUTURE) ×3 IMPLANT
SUT SILK 2 0 (SUTURE) ×2
SUT SILK 2 0 SH CR/8 (SUTURE) ×3 IMPLANT
SUT SILK 2 0SH CR/8 30 (SUTURE) IMPLANT
SUT SILK 2-0 18XBRD TIE 12 (SUTURE) ×1 IMPLANT
SUT SILK 2-0 30XBRD TIE 12 (SUTURE) IMPLANT
SUT SILK 3 0 (SUTURE) ×2
SUT SILK 3 0 SH CR/8 (SUTURE) ×3 IMPLANT
SUT SILK 3-0 18XBRD TIE 12 (SUTURE) ×1 IMPLANT
SUT VIC AB 3-0 SH 18 (SUTURE) ×6 IMPLANT
SUT VIC AB 4-0 SH 18 (SUTURE) IMPLANT
TAPE CLOTH SURG 6X10 WHT LF (GAUZE/BANDAGES/DRESSINGS) ×3 IMPLANT
TRAY FOLEY MTR SLVR 16FR STAT (SET/KITS/TRAYS/PACK) ×3 IMPLANT
YANKAUER SUCT BULB TIP NO VENT (SUCTIONS) IMPLANT

## 2018-05-17 NOTE — Anesthesia Preprocedure Evaluation (Signed)
Anesthesia Evaluation  Patient identified by MRN, date of birth, ID band Patient awake    Reviewed: Allergy & Precautions, NPO status , Patient's Chart, lab work & pertinent test results  Airway Mallampati: I  TM Distance: >3 FB Neck ROM: Full    Dental   Pulmonary asthma , former smoker,    Pulmonary exam normal        Cardiovascular hypertension, Pt. on medications Normal cardiovascular exam     Neuro/Psych    GI/Hepatic   Endo/Other  diabetes, Type 2, Oral Hypoglycemic Agents  Renal/GU      Musculoskeletal   Abdominal   Peds  Hematology   Anesthesia Other Findings   Reproductive/Obstetrics                             Anesthesia Physical Anesthesia Plan  ASA: II and emergent  Anesthesia Plan: General   Post-op Pain Management:    Induction: Intravenous  PONV Risk Score and Plan: 2 and Ondansetron, Midazolam and Treatment may vary due to age or medical condition  Airway Management Planned: Oral ETT  Additional Equipment:   Intra-op Plan:   Post-operative Plan: Extubation in OR  Informed Consent: I have reviewed the patients History and Physical, chart, labs and discussed the procedure including the risks, benefits and alternatives for the proposed anesthesia with the patient or authorized representative who has indicated his/her understanding and acceptance.     Plan Discussed with: CRNA and Surgeon  Anesthesia Plan Comments:         Anesthesia Quick Evaluation

## 2018-05-17 NOTE — ED Notes (Signed)
[facility]

## 2018-05-17 NOTE — Anesthesia Procedure Notes (Signed)
Procedure Name: Intubation Date/Time: 05/17/2018 9:30 AM Performed by: Lavina Hamman, CRNA Pre-anesthesia Checklist: Patient identified, Emergency Drugs available, Suction available, Patient being monitored and Timeout performed Patient Re-evaluated:Patient Re-evaluated prior to induction Oxygen Delivery Method: Circle system utilized Preoxygenation: Pre-oxygenation with 100% oxygen Induction Type: IV induction Ventilation: Mask ventilation without difficulty Laryngoscope Size: Mac and 4 Grade View: Grade I Tube type: Oral Tube size: 7.0 mm Number of attempts: 1 Airway Equipment and Method: Stylet Placement Confirmation: ETT inserted through vocal cords under direct vision,  positive ETCO2,  CO2 detector and breath sounds checked- equal and bilateral Secured at: 22 cm Tube secured with: Tape Dental Injury: Teeth and Oropharynx as per pre-operative assessment

## 2018-05-17 NOTE — ED Triage Notes (Signed)
Patient presents with co mid lower abd pain; states; " I feel like I have diverticulitis"; denies NVD.

## 2018-05-17 NOTE — ED Notes (Signed)
Surgery PA reports she wants the patient moved to Pre-Op.

## 2018-05-17 NOTE — ED Provider Notes (Signed)
[facility] EMERGENCY DEPARTMENT Provider Note   CSN: [id number] Arrival date & time: 05/17/18  0403     History   Chief Complaint Chief Complaint  Patient presents with  . Abdominal Pain    HPI [name] is a 67 y.o. male.  The history is provided by the patient.  Abdominal Pain   This is a recurrent problem. The current episode started more than 2 days ago. The problem occurs constantly. The problem has been gradually worsening. The pain is associated with an unknown factor. The pain is located in the suprapubic region. The pain is severe. Associated symptoms include constipation. Pertinent negatives include anorexia, fever, belching, diarrhea, hematochezia, melena, nausea, vomiting, dysuria, frequency, hematuria, headaches, arthralgias and myalgias. Nothing aggravates the symptoms. Nothing relieves the symptoms. Past workup includes CT scan. His past medical history does not include Crohn's disease.  Patient with a h/o diverticulitis who presents with his typical diverticular pain and bloating since being treated several days ago for bronchitis.  Seen at [facility] urgent care and given zpak and cough syrup but coughing persists and it has caused pain.  No f/c/r.  Reports his last BM was yesterday and was hard.  Took ibuprofen at ~ 1 am, that was his last ingestion of anything by mouth.  No dinner.    Past Medical History:  Diagnosis Date  . Arthritis   . Asthma   . Back pain   . Diabetes mellitus   . Diverticular disease   . Environmental allergies   . Hyperlipemia   . Hypertension     There are no active problems to display for this patient.   Past Surgical History:  Procedure Laterality Date  . ACHILLES TENDON REPAIR  1974   left  . COLONOSCOPY    . KIDNEY STONE SURGERY    . KNEE ARTHROSCOPY     rt  . SHOULDER ARTHROSCOPY  8/12   right  . TRIGGER FINGER RELEASE  11/17/2011   Procedure: RELEASE TRIGGER FINGER/A-1 PULLEY;  Surgeon: Wyn Forster., MD;   Location: [facility];  Service: Orthopedics;  Laterality: Left;  RELEASE LEFT LONG TRIGGER FINGER  . TRIGGER FINGER RELEASE  12/12   lt long finger  . TUMOR REMOVAL     rt neck/chin        Home Medications    Prior to Admission medications   Medication Sig Start Date End Date Taking? Authorizing Provider  azithromycin (ZITHROMAX) 250 MG tablet Take by mouth daily.   Yes [provider]  predniSONE (DELTASONE) 20 MG tablet Take 20 mg by mouth daily with breakfast. Taper dose pack   Yes [provider]  albuterol (PROVENTIL HFA;VENTOLIN HFA) 108 (90 BASE) MCG/ACT inhaler Inhale 2 puffs into the lungs every 6 (six) hours as needed.    [provider]  allopurinol (ZYLOPRIM) 300 MG tablet Take 300 mg by mouth daily.    [provider]  amLODipine (NORVASC) 5 MG tablet Take 5 mg by mouth daily.    [provider]  amoxicillin-clavulanate (AUGMENTIN) 875-125 MG tablet Take 1 tablet by mouth every 12 (twelve) hours. 11/22/17   [name], MD  aspirin 81 MG tablet Take 81 mg by mouth daily.    [provider]  fenofibrate 160 MG tablet Take 160 mg by mouth daily.    [provider]  HYDROcodone-acetaminophen (NORCO) 5-325 MG tablet Take 1-2 tablets by mouth every 6 (six) hours as needed. 11/16/15   Geoffery Lyons,  MD  LISINOPRIL PO Take by mouth.    [provider]  metFORMIN (GLUCOPHAGE) 1000 MG tablet Take 1,000 mg by mouth 2 (two) times daily with a meal.    [provider]  naproxen (NAPROSYN) 500 MG tablet Take 1 tablet (500 mg total) by mouth 2 (two) times daily. 05/12/16   Mabe, [name], MD  rosuvastatin (CRESTOR) 20 MG tablet Take 20 mg by mouth at bedtime.    [provider]    Family History History reviewed. No pertinent family history.  Social History Social History   Tobacco Use  . Smoking status: Former Smoker    Last attempt to quit: 03/25/2010    Years since  quitting: 8.1  . Smokeless tobacco: Never Used  Substance Use Topics  . Alcohol use: Yes    Comment: occ  . Drug use: No     Allergies   Hibiclens [chlorhexidine gluconate]; Flagyl [metronidazole]; and Augmentin [amoxicillin-pot clavulanate]   Review of Systems Review of Systems  Constitutional: Negative for diaphoresis and fever.  Respiratory: Negative for shortness of breath.   Cardiovascular: Negative for chest pain.  Gastrointestinal: Positive for abdominal distention, abdominal pain and constipation. Negative for anorexia, diarrhea, hematochezia, melena, nausea and vomiting.  Genitourinary: Negative for dysuria, frequency, hematuria and urgency.  Musculoskeletal: Negative for arthralgias and myalgias.  Neurological: Negative for headaches.  All other systems reviewed and are negative.    Physical Exam Updated Vital Signs BP (!) 176/88 (BP Location: Right Arm)   Pulse 65   Temp 97.8 F (36.6 C) (Oral)   Resp 18   Ht 5\' 5"  (1.651 m)   Wt 84.8 kg (187 lb)   SpO2 96%   BMI 31.12 kg/m   Physical Exam  Constitutional: He is oriented to person, place, and time. He appears well-developed and well-nourished.  HENT:  Head: Normocephalic and atraumatic.  Mouth/Throat: No oropharyngeal exudate.  Eyes: Pupils are equal, round, and reactive to light. Conjunctivae are normal.  Neck: Normal range of motion. Neck supple.  Cardiovascular: Normal rate, regular rhythm, normal heart sounds and intact distal pulses.  Pulmonary/Chest: Effort normal and breath sounds normal. No stridor. He has no wheezes. He has no rales.  Abdominal: He exhibits distension. He exhibits no mass. There is tenderness in the suprapubic area. There is negative Murphy's sign. No hernia.  Musculoskeletal: Normal range of motion.  Neurological: He is alert and oriented to person, place, and time.  Skin: Skin is warm and dry. Capillary refill takes less than 2 seconds. He is not diaphoretic.  Psychiatric: He  has a normal mood and affect.     ED Treatments / Results  Labs (all labs ordered are listed, but only abnormal results are displayed) Results for orders placed or performed during the hospital encounter of 05/17/18  CBC with Differential/Platelet  Result Value Ref Range   WBC 13.8 (H) 4.0 - 10.5 K/uL   RBC 4.50 4.22 - 5.81 MIL/uL   Hemoglobin 14.1 13.0 - 17.0 g/dL   HCT 96.0 45.4 - 09.8 %   MCV 92.0 78.0 - 100.0 fL   MCH 31.3 26.0 - 34.0 pg   MCHC 34.1 30.0 - 36.0 g/dL   RDW 11.9 14.7 - 82.9 %   Platelets 315 150 - 400 K/uL   Neutrophils Relative % 75 %   Neutro Abs 10.5 (H) 1.7 - 7.7 K/uL   Lymphocytes Relative 17 %   Lymphs Abs 2.3 0.7 - 4.0 K/uL   Monocytes Relative 7 %  Monocytes Absolute 0.9 0.1 - 1.0 K/uL   Eosinophils Relative 1 %   Eosinophils Absolute 0.1 0.0 - 0.7 K/uL   Basophils Relative 0 %   Basophils Absolute 0.0 0.0 - 0.1 K/uL  Basic metabolic panel  Result Value Ref Range   Sodium 139 135 - 145 mmol/L   Potassium 4.0 3.5 - 5.1 mmol/L   Chloride 102 101 - 111 mmol/L   CO2 28 22 - 32 mmol/L   Glucose, Bld 124 (H) 65 - 99 mg/dL   BUN 20 6 - 20 mg/dL   Creatinine, Ser 4.091.14 0.61 - 1.24 mg/dL   Calcium 9.0 8.9 - 81.110.3 mg/dL   GFR calc non Af Amer >60 >60 mL/min   GFR calc Af Amer >60 >60 mL/min   Anion gap 9 5 - 15  Urinalysis, Routine w reflex microscopic  Result Value Ref Range   Color, Urine YELLOW YELLOW   APPearance CLEAR CLEAR   Specific Gravity, Urine 1.025 1.005 - 1.030   pH 6.0 5.0 - 8.0   Glucose, UA NEGATIVE NEGATIVE mg/dL   Hgb urine dipstick NEGATIVE NEGATIVE   Bilirubin Urine NEGATIVE NEGATIVE   Ketones, ur NEGATIVE NEGATIVE mg/dL   Protein, ur NEGATIVE NEGATIVE mg/dL   Nitrite NEGATIVE NEGATIVE   Leukocytes, UA NEGATIVE NEGATIVE  I-Stat CG4 Lactic Acid, ED  Result Value Ref Range   Lactic Acid, Venous 0.76 0.5 - 1.9 mmol/L   Ct Renal Stone Study  Result Date: 05/17/2018 CLINICAL DATA:  Suprapubic abdominal pain EXAM: CT ABDOMEN  AND PELVIS WITHOUT CONTRAST TECHNIQUE: Multidetector CT imaging of the abdomen and pelvis was performed following the standard protocol without IV contrast. COMPARISON:  11/22/2017 FINDINGS: LOWER CHEST: No basilar pulmonary nodules or pleural effusion. No apical pericardial effusion. HEPATOBILIARY: Normal hepatic contours and density. No intra- or extrahepatic biliary dilatation. Normal gallbladder. PANCREAS: Normal parenchymal contours without ductal dilatation. No peripancreatic fluid collection. SPLEEN: Normal. ADRENALS/URINARY TRACT: --Adrenal glands: Normal. --Right kidney/ureter: No hydronephrosis, nephroureterolithiasis, perinephric stranding or solid renal mass. --Left kidney/ureter: No hydronephrosis, nephroureterolithiasis, perinephric stranding or solid renal mass. --Urinary bladder: Normal for degree of distention STOMACH/BOWEL: There is large volume pneumoperitoneum, predominantly concentrated in the anterior abdomen, but with innumerable small foci of gas throughout the abdomen. There is sigmoid diverticulosis with surrounding acute inflammation, which is the likely source of the perforation. VASCULAR/LYMPHATIC: Atherosclerotic calcification is present within the non-aneurysmal abdominal aorta, without hemodynamically significant stenosis. No abdominal or pelvic lymphadenopathy. REPRODUCTIVE: Mildly enlarged prostate MUSCULOSKELETAL. No bony spinal canal stenosis or focal osseous abnormality. OTHER: None. IMPRESSION: 1. Acute diverticulitis with large volume pneumoperitoneum, consistent with perforation. When the acute symptoms have resolved, colonoscopy should be considered to exclude neoplasm at this location, as this is the same site of the patient's prior episode of diverticulitis. 2.  Aortic Atherosclerosis (ICD10-I70.0). Critical Value/emergent results were called by telephone at the time of interpretation on 05/17/2018 at 4:57 am to Dr. Cy BlamerAPRIL Khylin Gutridge , who verbally acknowledged these results.  Electronically Signed   By: Deatra RobinsonKevin  Herman M.D.   On: 05/17/2018 04:58    Radiology No results found.  Procedures Procedures (including critical care time)  Medications Ordered in ED Medications  sodium chloride 0.9 % bolus 1,000 mL (1,000 mLs Intravenous New Bag/Given 05/17/18 0441)  fentaNYL (SUBLIMAZE) injection 100 mcg (has no administration in time range)  meropenem (MERREM) 1 g in sodium chloride 0.9 % 100 mL IVPB (has no administration in time range)    MDM Reviewed: previous chart, nursing note and  vitals Reviewed previous: labs and CT scan Interpretation: labs and CT scan (elevation of white count, normal lactate, normal elecrolytes., perforation by me on CT scan) Total time providing critical care: 75-105 minutes. This excludes time spent performing separately reportable procedures and services. Consults: general surgery (pharmacy regarding antibiotics choice with patient's allergies)  CRITICAL CARE Performed by: Jasmine Awe Total critical care time: 75 minutes Critical care time was exclusive of separately billable procedures and treating other patients. Critical care was necessary to treat or prevent imminent or life-threatening deterioration. Critical care was time spent personally by me on the following activities: development of treatment plan with patient and/or surrogate as well as nursing, discussions with consultants, evaluation of patient's response to treatment, examination of patient, obtaining history from patient or surrogate, ordering and performing treatments and interventions, ordering and review of laboratory studies, ordering and review of radiographic studies, pulse oximetry and re-evaluation of patient's condition. 541 Case d/w Dr. Maisie Fus, ED to ED transport   545 Case d/w Dr. Read Drivers who is aware of transfer  Final Clinical Impressions(s) / ED Diagnoses   Will admit to [facility] for perforation with diverticulitis     Kendle Erker, MD 05/17/18  4098

## 2018-05-17 NOTE — ED Provider Notes (Signed)
7:31 AM Patient sent from [facility] after Dr. Daun PeacockPalombo spoke with Dr. Maisie Fushomas.  The patient was diagnosed there with a perforated viscus.  The patient's vital signs are stable and he is in no acute distress.  His abdomen is distended and diffusely tender.  [facility] has been advised of the patient's arrival.      Paula LibraMolpus, Krystol Rocco, MD 05/17/18 [phone]

## 2018-05-17 NOTE — ED Notes (Signed)
This Clinical research associatewriter spoke to OR regarding allergy to wipes.  Sts they will handle cleaning in OR.

## 2018-05-17 NOTE — ED Notes (Addendum)
Called General Surgery back @ 07:08am [facility].

## 2018-05-17 NOTE — Consult Note (Addendum)
WOC Nurse requested for preoperative stoma site marking  Discussed surgical procedure and stoma creation with patient. Explained role of the WOC nurse team.  Provided the patient with educational booklet and provided samples of pouching options.  Answered patient's questions.   Examined patient lying, and sitting upright on the stretcher, in order to place the marking in the patient's visual field, away from any creases or abdominal contour issues and within the rectus muscle.  Attempted to mark below the patient's belt line, but this was not possible, since he wears his pants low and there is a significant crease which should be avoided if possible. His abd is very tight and distended; suspect more creases will appear post-op which are not apparent at this time.  Marked for colostomy in the LLQ  __7__ cm to the left of the umbilicus and __2__cm below the umbilicus.  Marked for ileostomy in the RLQ  __7__cm to the right of the umbilicus and  __2__ cm below the umbilicus.  Patient's abdomen cleansed with CHG wipes at site markings, allowed to air dry prior to marking. Plans to go to surgery immediately. WOC Nurse team will follow up with patient after surgery for continue ostomy care and teaching if he receives an ostomy.  Cammie Mcgeeawn Nicandro Perrault MSN, RN, CWOCN, [place]WCN-AP, CNS [phone]

## 2018-05-17 NOTE — Progress Notes (Signed)
Pharmacy Antibiotic Note  Elenore RotaLuis Mcculley is a 67 y.o. male admitted on 05/17/2018 with pneumoperitoneum, diverticular disease with perforation of sigmoid colon.  Pharmacy has been consulted for Zosyn dosing.  Allergies:  Augmentin allergy (hives) - removed 6/14 by MD.  Metronidazole (unknown).  Pt is post-surgery and unable to confirm/deny allergies. SCr 1.14 Lactic acid 0.76 WBC 13.8  Plan: Zosyn 3.375g IV Q8H infused over 4hrs.  Follow up renal function, culture results, and clinical course.   Height: 5\' 5"  (165.1 cm) Weight: 187 lb (84.8 kg) IBW/kg (Calculated) : 61.5  Temp (24hrs), Avg:97.8 F (36.6 C), Min:97.8 F (36.6 C), Max:97.8 F (36.6 C)  Recent Labs  Lab 05/17/18 0417 05/17/18 0456  WBC 13.8*  --   CREATININE 1.14  --   LATICACIDVEN  --  0.76    Estimated Creatinine Clearance: 64.7 mL/min (by C-G formula based on SCr of 1.14 mg/dL).    Allergies  Allergen Reactions  . Hibiclens [Chlorhexidine Gluconate] Rash    Pt. Had rash on torso after last surgery, mostly on surgical side.  Am listing CHG as potential allergy.  . Flagyl [Metronidazole]   . Augmentin [Amoxicillin-Pot Clavulanate] Hives    Antimicrobials this admission: 6/14 Meropenem >> 6/14 6/14 Zosyn >>   Dose adjustments this admission:   Microbiology results: 6/14 BCx:   Thank you for allowing pharmacy to be a part of this patient's care.  Lynann Beaverhristine Konstantina Nachreiner PharmD, BCPS Pager [phone] 05/17/2018 8:38 AM

## 2018-05-17 NOTE — Brief Op Note (Signed)
05/17/2018  11:13 AM  PATIENT:  [name]  67 y.o. male  PRE-OPERATIVE DIAGNOSIS:  pneumo peritoneum  POST-OPERATIVE DIAGNOSIS:  perforated sigmoid colon, diverticular disease  PROCEDURE:  Procedure(s): EXPLORATORY LAPAROTOMY SIGMOID COLECTOMY AND  COLOSTOMY (N/A)  SURGEON:  Surgeon(s) and Role:    Darnell Level* Ercel Normoyle, MD - Primary  ASSISTANTS: Carlena BjornstadBrooke Meuth, PA-C   ANESTHESIA:   general  EBL:  75 mL   BLOOD ADMINISTERED:none  DRAINS: none   LOCAL MEDICATIONS USED:  NONE  SPECIMEN:  Excision  DISPOSITION OF SPECIMEN:  PATHOLOGY  COUNTS:  YES  TOURNIQUET:  * No tourniquets in log *  DICTATION: .Other Dictation: Dictation Number [id number]  PLAN OF CARE: Admit to inpatient   PATIENT DISPOSITION:  PACU - hemodynamically stable.   Delay start of Pharmacological VTE agent (>24hrs) due to surgical blood loss or risk of bleeding: yes  Darnell Levelodd Amaliya Whitelaw, MD [facility] Office: [phone]

## 2018-05-17 NOTE — ED Notes (Signed)
Surgery PA at bedside.  

## 2018-05-17 NOTE — Anesthesia Postprocedure Evaluation (Signed)
Anesthesia Post Note  Patient: [name]  Procedure(s) Performed: EXPLORATORY LAPAROTOMY SIGMOID COLECTOMY AND  COLOSTOMY (N/A Abdomen)     Patient location during evaluation: PACU Anesthesia Type: General Level of consciousness: awake and alert Pain management: pain level controlled Vital Signs Assessment: post-procedure vital signs reviewed and stable Respiratory status: spontaneous breathing, nonlabored ventilation, respiratory function stable and patient connected to nasal cannula oxygen Cardiovascular status: blood pressure returned to baseline and stable Postop Assessment: no apparent nausea or vomiting Anesthetic complications: no    Last Vitals:  Vitals:   05/17/18 1230 05/17/18 1249  BP: 129/72 (!) 152/83  Pulse: 74   Resp: 16 16  Temp: 36.9 C 36.5 C  SpO2: 93% 97%    Last Pain:  Vitals:   05/17/18 1230  TempSrc:   PainSc: Asleep                 Alyssa Mancera DAVID

## 2018-05-17 NOTE — Transfer of Care (Signed)
Immediate Anesthesia Transfer of Care Note  Patient: [name]  Procedure(s) Performed: EXPLORATORY LAPAROTOMY SIGMOID COLECTOMY AND  COLOSTOMY (N/A Abdomen)  Patient Location: PACU  Anesthesia Type:General  Level of Consciousness: awake, alert  and oriented  Airway & Oxygen Therapy: Patient Spontanous Breathing and Patient connected to face mask oxygen  Post-op Assessment: Report given to RN and Post -op Vital signs reviewed and stable  Post vital signs: Reviewed and stable  Last Vitals:  Vitals Value Taken Time  BP 179/92 05/17/2018 11:11 AM  Temp    Pulse 72 05/17/2018 11:13 AM  Resp 16 05/17/2018 11:13 AM  SpO2 98 % 05/17/2018 11:13 AM  Vitals shown include unvalidated device data.  Last Pain:  Vitals:   05/17/18 0734  TempSrc:   PainSc: 7          Complications: No apparent anesthesia complications

## 2018-05-17 NOTE — H&P (Addendum)
[facility] Surgery Consult/Admission Note  [name] 19-Jun-1951  [id number].    Requesting MD: Randal Buba, MD Chief Complaint/Reason for Consult: pneumoperitoneum   HPI:  [name] is a 67 year old male with a past medical history of asthma, diverticular disease, hypertension, hyperlipidemia, and seasonal allergies who presented to [place] early this morning with a chief complaint of suprapubic abdominal pain.  Patient states that he went to an urgent care 1 week ago for a significant cough and was started on medication (abx + prednisone taper) for bronchitis.  He began having abdominal pain roughly 48 hours ago which she initially attributed to all the coughing he has been doing.  The pain in his lower abdomen continued to worsen and, because he has a history of diverticulitis, he decided to go to the emergency department for further evaluation.  The pain is located in the suprapubic region and is described as stabbing, constant, nonradiating.  Patient denies urinary symptoms.  Patient reports a formed, nonbloody bowel movement yesterday.  Patient denies fever, chills, nausea, vomiting.  States for the past few years he has had a bout of diverticulitis annually.  All of these occurrences have been treated with oral antibiotics on an outpatient basis.  He is unable to recall when his initial diagnosis of diverticulitis was made.  Patient denies a history of gastric or duodenal ulcers.  He denies a history of regular NSAID use. States his last colonoscopy was 5 to 7 years ago in [facility].  Denies a history of colon cancer or other malignancy.  Denies a known family history of colon cancer.  Patient is a former smoker who quit in 2011.  Denies alcohol or illicit drug use. Denies previous abdominal surgeries.  Reports a history of umbilical hernia since birth.  Denies previous issues under general anesthesia denies use of blood thinning medications.  The patient reports skin rash reaction  to Hibiclens and penicillins. The patient is currently retired.   Work-up at [facility] significant for a leukocytosis of over 13,000 and a CT scan of the abdomen and pelvis was significant for inflammation of the sigmoid colon and a large amount of free air.  Lactate normal.  UA normal.  ROS: Review of Systems  Respiratory: Positive for cough. Negative for hemoptysis, sputum production and shortness of breath.   Cardiovascular: Negative.   Gastrointestinal: Positive for abdominal pain.  Genitourinary: Negative.   All other systems reviewed and are negative.  History reviewed. No pertinent family history.  Past Medical History:  Diagnosis Date  . Arthritis   . Asthma   . Back pain   . Diabetes mellitus   . Diverticular disease   . Environmental allergies   . Hyperlipemia   . Hypertension     Past Surgical History:  Procedure Laterality Date  . ACHILLES TENDON REPAIR  1974   left  . COLONOSCOPY    . KIDNEY STONE SURGERY    . KNEE ARTHROSCOPY     rt  . SHOULDER ARTHROSCOPY  8/12   right  . TRIGGER FINGER RELEASE  11/17/2011   Procedure: RELEASE TRIGGER FINGER/A-1 PULLEY;  Surgeon: Cammie Sickle., MD;  Location: [facility];  Service: Orthopedics;  Laterality: Left;  RELEASE LEFT LONG TRIGGER FINGER  . TRIGGER FINGER RELEASE  12/12   lt long finger  . TUMOR REMOVAL     rt neck/chin    Social History:  reports that he quit smoking about 8 years ago. He  has never used smokeless tobacco. He reports that he drinks alcohol. He reports that he does not use drugs.  Allergies:  Allergies  Allergen Reactions  . Hibiclens [Chlorhexidine Gluconate] Rash    Pt. Had rash on torso after last surgery, mostly on surgical side.  Am listing CHG as potential allergy.  . Flagyl [Metronidazole]   . Augmentin [Amoxicillin-Pot Clavulanate] Hives     (Not in a hospital admission)  Blood pressure (!) 160/78, pulse 66, temperature 97.8 F (36.6 C),  temperature source Oral, resp. rate 18, height _0  (1.651 m), weight 84.8 kg (187 lb), SpO2 97 %. Physical Exam: Physical Exam  Constitutional: He is oriented to person, place, and time. He appears well-developed and well-nourished.  Non-toxic appearance. He does not appear ill.  HENT:  Head: Normocephalic and atraumatic.  Eyes: Pupils are equal, round, and reactive to light. No scleral icterus.  Cardiovascular: Normal rate, regular rhythm and intact distal pulses.  Pulmonary/Chest: Effort normal. No respiratory distress. He has wheezes (Mild, left upper and middle lobes). He has no rales. He exhibits no tenderness.  Abdominal: He exhibits distension. Bowel sounds are decreased. There is tenderness in the right lower quadrant and suprapubic area. A hernia is present. Hernia confirmed positive in the ventral area.  Protuberant, tight, tympanic There is no peritonitis  Genitourinary: Penis normal.  Neurological: He is alert and oriented to person, place, and time.  Skin: Skin is warm and dry. No rash noted.  Psychiatric: He has a normal mood and affect. His behavior is normal.    Results for orders placed or performed during the hospital encounter of 05/17/18 (from the past 48 hour(s))  CBC with Differential/Platelet     Status: Abnormal   Collection Time: 05/17/18  4:17 AM  Result Value Ref Range   WBC 13.8 (H) 4.0 - 10.5 K/uL   RBC 4.50 4.22 - 5.81 MIL/uL   Hemoglobin 14.1 13.0 - 17.0 g/dL   HCT 41.4 39.0 - 52.0 %   MCV 92.0 78.0 - 100.0 fL   MCH 31.3 26.0 - 34.0 pg   MCHC 34.1 30.0 - 36.0 g/dL   RDW 13.0 11.5 - 15.5 %   Platelets 315 150 - 400 K/uL   Neutrophils Relative % 75 %   Neutro Abs 10.5 (H) 1.7 - 7.7 K/uL   Lymphocytes Relative 17 %   Lymphs Abs 2.3 0.7 - 4.0 K/uL   Monocytes Relative 7 %   Monocytes Absolute 0.9 0.1 - 1.0 K/uL   Eosinophils Relative 1 %   Eosinophils Absolute 0.1 0.0 - 0.7 K/uL   Basophils Relative 0 %   Basophils Absolute 0.0 0.0 - 0.1 K/uL     Comment: Performed at [facility], [address] [place], [place] [address]  Basic metabolic panel     Status: Abnormal   Collection Time: 05/17/18  4:17 AM  Result Value Ref Range   Sodium 139 135 - 145 mmol/L   Potassium 4.0 3.5 - 5.1 mmol/L   Chloride 102 101 - 111 mmol/L   CO2 28 22 - 32 mmol/L   Glucose, Bld 124 (H) 65 - 99 mg/dL   BUN 20 6 - 20 mg/dL   Creatinine, Ser 1.14 0.61 - 1.24 mg/dL   Calcium 9.0 8.9 - 10.3 mg/dL   GFR calc non Af Amer >60 >60 mL/min   GFR calc Af Amer >60 >60 mL/min    Comment: (NOTE) The eGFR has been calculated using the CKD EPI  equation. This calculation has not been validated in all clinical situations. eGFR's persistently <60 mL/min signify possible Chronic Kidney Disease.    Anion gap 9 5 - 15    Comment: Performed at [facility], [address] [place], [place] [address]  Urinalysis, Routine w reflex microscopic     Status: None   Collection Time: 05/17/18  4:20 AM  Result Value Ref Range   Color, Urine YELLOW YELLOW   APPearance CLEAR CLEAR   Specific Gravity, Urine 1.025 1.005 - 1.030   pH 6.0 5.0 - 8.0   Glucose, UA NEGATIVE NEGATIVE mg/dL   Hgb urine dipstick NEGATIVE NEGATIVE   Bilirubin Urine NEGATIVE NEGATIVE   Ketones, ur NEGATIVE NEGATIVE mg/dL   Protein, ur NEGATIVE NEGATIVE mg/dL   Nitrite NEGATIVE NEGATIVE   Leukocytes, UA NEGATIVE NEGATIVE    Comment: Microscopic not done on urines with negative protein, blood, leukocytes, nitrite, or glucose < 500 mg/dL. Performed at [facility], [address] [place], [place] [address]   I-Stat CG4 Lactic Acid, ED     Status: None   Collection Time: 05/17/18  4:56 AM  Result Value Ref Range   Lactic Acid, Venous 0.76 0.5 - 1.9 mmol/L   Ct Renal Stone Study  Result Date: 05/17/2018 CLINICAL DATA:  Suprapubic abdominal pain EXAM: CT ABDOMEN AND PELVIS WITHOUT CONTRAST TECHNIQUE: Multidetector CT imaging of the abdomen and pelvis was performed  following the standard protocol without IV contrast. COMPARISON:  11/22/2017 FINDINGS: LOWER CHEST: No basilar pulmonary nodules or pleural effusion. No apical pericardial effusion. HEPATOBILIARY: Normal hepatic contours and density. No intra- or extrahepatic biliary dilatation. Normal gallbladder. PANCREAS: Normal parenchymal contours without ductal dilatation. No peripancreatic fluid collection. SPLEEN: Normal. ADRENALS/URINARY TRACT: --Adrenal glands: Normal. --Right kidney/ureter: No hydronephrosis, nephroureterolithiasis, perinephric stranding or solid renal mass. --Left kidney/ureter: No hydronephrosis, nephroureterolithiasis, perinephric stranding or solid renal mass. --Urinary bladder: Normal for degree of distention STOMACH/BOWEL: There is large volume pneumoperitoneum, predominantly concentrated in the anterior abdomen, but with innumerable small foci of gas throughout the abdomen. There is sigmoid diverticulosis with surrounding acute inflammation, which is the likely source of the perforation. VASCULAR/LYMPHATIC: Atherosclerotic calcification is present within the non-aneurysmal abdominal aorta, without hemodynamically significant stenosis. No abdominal or pelvic lymphadenopathy. REPRODUCTIVE: Mildly enlarged prostate MUSCULOSKELETAL. No bony spinal canal stenosis or focal osseous abnormality. OTHER: None. IMPRESSION: 1. Acute diverticulitis with large volume pneumoperitoneum, consistent with perforation. When the acute symptoms have resolved, colonoscopy should be considered to exclude neoplasm at this location, as this is the same site of the patient's prior episode of diverticulitis. 2.  Aortic Atherosclerosis (ICD10-I70.0). Critical Value/emergent results were called by telephone at the time of interpretation on 05/17/2018 at 4:57 am to Dr. Veatrice Kells , who verbally acknowledged these results. Electronically Signed   By: Ulyses Jarred M.D.   On: 05/17/2018 04:58   Assessment/Plan Hypertension -  resume home meds postoperatively, PRN hydralazine Hyperlipidemia Diabetes mellitus -SSI Gout Asthma -PRN albuterol nebs Bronchitis -D/C prednisone  Pneumoperitoneum Patient history, physical exam, and CT scan findings are most consistent with perforated sigmoid diverticulitis.  Recommend n.p.o., IV antibiotics, and proceeding to the operating room this morning for exploratory laparotomy and probable colectomy/colostomy.  I discussed the procedure in detail with the patient and he agrees to proceed.  WOC RN to mark the patient's abdomen preoperatively.   Jill Alexanders, [facility] Surgery 05/17/2018, 8:08 AM Pager: [phone] Consults: [phone] Mon-Fri 7:00 am-4:30 pm Sat-Sun 7:00 am-11:30  am

## 2018-05-17 NOTE — Op Note (Signed)
[name], [name] MEDICAL RECORD [id number] ACCOUNT [id number] DATE OF BIRTH:10-23-1951 FACILITY: [facility] LOCATION: [facility] PHYSICIAN:Aydeen Blume Molly MaduroM. Ritta Hammes, MD  OPERATIVE REPORT  DATE OF PROCEDURE:  05/17/2018  PREOPERATIVE DIAGNOSIS:  Perforated viscus, pneumoperitoneum.  POSTOPERATIVE DIAGNOSIS:  Diverticular disease with perforation of sigmoid colon.  PROCEDURES: 1.  Exploratory laparotomy. 2.  Sigmoid colectomy. 3.  End descending colostomy with Hartmann's pouch.  SURGEON:  Velora Hecklerodd M. Yoko Mcgahee, MD  ASSISTANT:  Carlena BjornstadBrooke Meuth, PA-C  ANESTHESIA:  General.  ESTIMATED BLOOD LOSS:  Minimal.  PREPARATION:  Betadine.  COMPLICATIONS:  None.  INDICATIONS:  The patient is a 67 year old male who presented to [facility] with abdominal pain.  Evaluation included a CT scan of the abdomen and pelvis which confirmed pneumoperitoneum.  There were inflammatory changes in the sigmoid  colon consistent with the patient's history of diverticular disease.  The [facility] in preparation for urgent surgery.  DESCRIPTION OF PROCEDURE:  Procedure [facility] done at OR [facility].  The patient was brought to the operating room and placed in supine position on the operating room table.  Following administration of general anesthesia, the  patient was positioned and then prepped and draped in the usual aseptic fashion.  After ascertaining that an adequate level of anesthesia had been achieved, midline abdominal incision was made with a #10 blade.  Dissection was carried through  subcutaneous tissues.  Fascia was incised in the midline, and the peritoneal cavity was entered cautiously.  The abdomen was explored.  There were omental adhesions in the pelvis.  These were taken down bluntly and with the use of the electrocautery.   There was cloudy purulent-appearing fluid throughout the peritoneal cavity.  Palpation reveals an area of  acute inflammatory change in the mid sigmoid colon.  This appears to be the site of perforation.  Disease is relatively focal.  A Balfour retractor was placed for exposure.  Omentum and small bowel were packed cephalad.  The sigmoid colon was mobilized from its peritoneal attachments.  The mesentery was incised.  Dissection was carried down to the proximal rectum.  The rectal  wall was dissected out circumferentially, and then the rectum was transected with an Ethicon contour stapler.  Mesentery was then divided using the LigaSure for hemostasis.  The sigmoid colon was then resected.  The proximal sigmoid colon was then  divided with a GIA stapler.  The specimen was submitted to pathology.  The abdomen was irrigated with warm saline, which was evacuated.  Good hemostasis was noted.  The distal descending colon and proximal sigmoid were mobilized from their peritoneal attachments to allow for colostomy placement in the left lower quadrant of  the abdominal wall at the site previously marked by the stomal therapist.  An elliptical incision was made at the indicated site in the left lower quadrant of the abdominal wall.  A plug of adipose tissue was excised.  A cruciate incision was made through the rectus sheath and into the peritoneal cavity.  The inferior  epigastric artery was divided between hemostats and ligated with 2-0 silk ties.  Using our Babcock clamp, the bowel was brought through the abdominal wall into position.  Abdomen was again irrigated with warm saline, which was evacuated.  Omentum was  used to cover the small bowel.  The staple line in the proximal rectum was marked with 2-0 Prolene sutures at the ends of the staple line.  All sponges were removed, and counts  were noted to be correct.  Midline incision was then closed with interrupted  #1 Novafil simple sutures.  Subcutaneous tissues were irrigated.  The umbilicus was reapproximated with stainless steel staples.  The midline wound was  packed with Betadine-soaked Kerlix gauze packing and covered with dry gauze dressing.  Colostomy was matured by excising the staple line with the electrocautery.  The edges of the colon were then matured to the skin edges circumferentially with interrupted 3-0 Vicryl sutures.  A colostomy appliance was applied.  The patient was awakened from anesthesia and brought to the recovery room.  The patient tolerated the procedure well.  Darnell Level, MD [facility] Surgery Office: [phone]    LN/NUANCE  D:05/17/2018 T:05/17/2018 JOB:[id number]

## 2018-05-17 NOTE — ED Notes (Signed)
Report given to Short Stay and transport will be coming shortly.

## 2018-05-18 ENCOUNTER — Encounter (HOSPITAL_COMMUNITY): Payer: Self-pay | Admitting: Surgery

## 2018-05-18 LAB — BASIC METABOLIC PANEL
Anion gap: 6 (ref 5–15)
BUN: 19 mg/dL (ref 6–20)
CALCIUM: 8.3 mg/dL — AB (ref 8.9–10.3)
CO2: 26 mmol/L (ref 22–32)
Chloride: 108 mmol/L (ref 101–111)
Creatinine, Ser: 1.15 mg/dL (ref 0.61–1.24)
GLUCOSE: 169 mg/dL — AB (ref 65–99)
Potassium: 4.5 mmol/L (ref 3.5–5.1)
Sodium: 140 mmol/L (ref 135–145)

## 2018-05-18 LAB — CBC
HCT: 37.1 % — ABNORMAL LOW (ref 39.0–52.0)
Hemoglobin: 12.2 g/dL — ABNORMAL LOW (ref 13.0–17.0)
MCH: 30.7 pg (ref 26.0–34.0)
MCHC: 32.9 g/dL (ref 30.0–36.0)
MCV: 93.5 fL (ref 78.0–100.0)
PLATELETS: 291 10*3/uL (ref 150–400)
RBC: 3.97 MIL/uL — ABNORMAL LOW (ref 4.22–5.81)
RDW: 13.1 % (ref 11.5–15.5)
WBC: 13.1 10*3/uL — ABNORMAL HIGH (ref 4.0–10.5)

## 2018-05-18 LAB — GLUCOSE, CAPILLARY
GLUCOSE-CAPILLARY: 157 mg/dL — AB (ref 65–99)
Glucose-Capillary: 134 mg/dL — ABNORMAL HIGH (ref 65–99)
Glucose-Capillary: 135 mg/dL — ABNORMAL HIGH (ref 65–99)
Glucose-Capillary: 112 mg/dL — ABNORMAL HIGH (ref 65–99)

## 2018-05-18 LAB — HIV ANTIBODY (ROUTINE TESTING W REFLEX): HIV Screen 4th Generation wRfx: NONREACTIVE

## 2018-05-18 MED ORDER — KETOROLAC TROMETHAMINE 15 MG/ML IJ SOLN
15.0000 mg | Freq: Four times a day (QID) | INTRAMUSCULAR | Status: AC
  Administered 2018-05-18 – 2018-05-19 (×5): 15 mg via INTRAVENOUS
  Filled 2018-05-18 (×5): qty 1

## 2018-05-18 MED ORDER — LABETALOL HCL 5 MG/ML IV SOLN
5.0000 mg | Freq: Four times a day (QID) | INTRAVENOUS | Status: DC | PRN
  Filled 2018-05-18: qty 4

## 2018-05-18 NOTE — Progress Notes (Signed)
General Surgery [facility]  Assessment & Plan: POD#1 status post ex lap with Hartmann's procedure for perforated diverticulitis  IV Zosyn  NG, NPO, IV hydration  PCA for pain control - will add Toradol now  OOB, ambulate in halls  Will begin dressing changes tomorrow 6/16        Velora Heckler, MD, [facility]       Office: [phone]    Chief Complaint: Perforated diverticulitis  Subjective: Patient in bed, family at bedside.  Complains of pain with coughing.  Ambulated today.  Objective: Vital signs in last 24 hours: Temp:  [97.7 F (36.5 C)-99 F (37.2 C)] 98.8 F (37.1 C) (06/15 1325) Pulse Rate:  [56-70] 59 (06/15 1325) Resp:  [14-20] 18 (06/15 1325) BP: (125-151)/(65-85) 151/85 (06/15 1325) SpO2:  [95 %-99 %] 96 % (06/15 1325) Last BM Date: 05/16/18  Intake/Output from previous day: 06/14 0701 - 06/15 0700 In: 1692.3 [I.V.:1692.3] Out: 2385 [Urine:2260; Emesis/NG output:50; Blood:75] Intake/Output this shift: Total I/O In: -  Out: 600 [Urine:600]  Physical Exam: HEENT - sclerae clear, mucous membranes moist Neck - soft Chest - coarse bilaterally Cor - RRR Abdomen - mild distension; few BS present; dressing dry and intact; stoma viable LLQ Ext - no edema, non-tender Neuro - alert & oriented, no focal deficits  Lab Results:  Recent Labs    05/17/18 0417 05/18/18 0343  WBC 13.8* 13.1*  HGB 14.1 12.2*  HCT 41.4 37.1*  PLT 315 291   BMET Recent Labs    05/17/18 0417 05/18/18 0343  NA 139 140  K 4.0 4.5  CL 102 108  CO2 28 26  GLUCOSE 124* 169*  BUN 20 19  CREATININE 1.14 1.15  CALCIUM 9.0 8.3*   PT/INR No results for input(s): LABPROT, INR in the last 72 hours. Comprehensive Metabolic Panel:    Component Value Date/Time   NA 140 05/18/2018 0343   NA 139 05/17/2018 0417   K 4.5 05/18/2018 0343   K 4.0 05/17/2018 0417   CL 108 05/18/2018 0343   CL 102 05/17/2018 0417   CO2  26 05/18/2018 0343   CO2 28 05/17/2018 0417   BUN 19 05/18/2018 0343   BUN 20 05/17/2018 0417   CREATININE 1.15 05/18/2018 0343   CREATININE 1.14 05/17/2018 0417   GLUCOSE 169 (H) 05/18/2018 0343   GLUCOSE 124 (H) 05/17/2018 0417   CALCIUM 8.3 (L) 05/18/2018 0343   CALCIUM 9.0 05/17/2018 0417   AST 20 11/22/2017 1220   ALT 20 11/22/2017 1220   ALKPHOS 38 11/22/2017 1220   BILITOT 1.0 11/22/2017 1220   PROT 7.1 11/22/2017 1220   ALBUMIN 4.2 11/22/2017 1220    Studies/Results: Ct Renal Stone Study  Result Date: 05/17/2018 CLINICAL DATA:  Suprapubic abdominal pain EXAM: CT ABDOMEN AND PELVIS WITHOUT CONTRAST TECHNIQUE: Multidetector CT imaging of the abdomen and pelvis was performed following the standard protocol without IV contrast. COMPARISON:  11/22/2017 FINDINGS: LOWER CHEST: No basilar pulmonary nodules or pleural effusion. No apical pericardial effusion. HEPATOBILIARY: Normal hepatic contours and density. No intra- or extrahepatic biliary dilatation. Normal gallbladder. PANCREAS: Normal parenchymal contours without ductal dilatation. No peripancreatic fluid collection. SPLEEN: Normal. ADRENALS/URINARY TRACT: --Adrenal glands: Normal. --Right kidney/ureter: No hydronephrosis, nephroureterolithiasis, perinephric stranding or solid renal mass. --Left kidney/ureter: No hydronephrosis, nephroureterolithiasis, perinephric stranding or solid renal mass. --Urinary bladder: Normal for degree of distention STOMACH/BOWEL: There is large volume pneumoperitoneum, predominantly concentrated in  the anterior abdomen, but with innumerable small foci of gas throughout the abdomen. There is sigmoid diverticulosis with surrounding acute inflammation, which is the likely source of the perforation. VASCULAR/LYMPHATIC: Atherosclerotic calcification is present within the non-aneurysmal abdominal aorta, without hemodynamically significant stenosis. No abdominal or pelvic lymphadenopathy. REPRODUCTIVE: Mildly  enlarged prostate MUSCULOSKELETAL. No bony spinal canal stenosis or focal osseous abnormality. OTHER: None. IMPRESSION: 1. Acute diverticulitis with large volume pneumoperitoneum, consistent with perforation. When the acute symptoms have resolved, colonoscopy should be considered to exclude neoplasm at this location, as this is the same site of the patient's prior episode of diverticulitis. 2.  Aortic Atherosclerosis (ICD10-I70.0). Critical Value/emergent results were called by telephone at the time of interpretation on 05/17/2018 at 4:57 am to Dr. Cy BlamerAPRIL PALUMBO , who verbally acknowledged these results. Electronically Signed   By: Deatra RobinsonKevin  Herman M.D.   On: 05/17/2018 04:58      Kyle Stansell M 05/18/2018  Patient ID: [name], male   DOB: 1951-10-14, 67 y.o.   MRN: [id number]

## 2018-05-19 LAB — GLUCOSE, CAPILLARY
GLUCOSE-CAPILLARY: 76 mg/dL (ref 65–99)
Glucose-Capillary: 90 mg/dL (ref 65–99)
Glucose-Capillary: 95 mg/dL (ref 65–99)
Glucose-Capillary: 99 mg/dL (ref 65–99)

## 2018-05-19 MED ORDER — POLYVINYL ALCOHOL 1.4 % OP SOLN
1.0000 [drp] | OPHTHALMIC | Status: DC | PRN
Start: 2018-05-19 — End: 2018-05-22
  Administered 2018-05-19: 1 [drp] via OPHTHALMIC
  Filled 2018-05-19: qty 15

## 2018-05-19 NOTE — Progress Notes (Signed)
Called MD regarding BP and Pulse rate, 167/88 and 51.  Orders given, will continue to monitor.

## 2018-05-19 NOTE — Progress Notes (Signed)
Patient ID: [name], male   DOB: November 13, 1951, 66 y.o.   MRN: [id number] [facility] Progress Note:   2 Days Post-Op  Subjective: Mental status is clear;  Up walking multiple laps; Objective: Vital signs in last 24 hours: Temp:  [98.1 F (36.7 C)-99.7 F (37.6 C)] 99.7 F (37.6 C) (06/16 0547) Pulse Rate:  [53-59] 53 (06/16 0547) Resp:  [14-18] 15 (06/16 0825) BP: (122-151)/(77-85) 143/82 (06/16 0547) SpO2:  [95 %-99 %] 97 % (06/16 0825)  Intake/Output from previous day: 06/15 0701 - 06/16 0700 In: 1200 [I.V.:1200] Out: 2050 [Urine:2050] Intake/Output this shift: No intake/output data recorded.  Physical Exam: Work of breathing is as expected.  Ostomy without output yet  Lab Results:  Results for orders placed or performed during the hospital encounter of 05/17/18 (from the past 48 hour(s))  Glucose, capillary     Status: Abnormal   Collection Time: 05/17/18 11:23 AM  Result Value Ref Range   Glucose-Capillary 166 (H) 65 - 99 mg/dL   Comment 1 Notify RN    Comment 2 Document in Chart   HIV antibody (Routine Testing)     Status: None   Collection Time: 05/17/18 12:02 PM  Result Value Ref Range   HIV Screen 4th Generation wRfx Non Reactive Non Reactive    Comment: (NOTE) Performed At: [facility] [place], [place] [id number] Rush Farmer MD OJ:[id number] Performed at [facility], [place] [address] [place], [place] [address]   Glucose, capillary     Status: Abnormal   Collection Time: 05/17/18  4:51 PM  Result Value Ref Range   Glucose-Capillary 165 (H) 65 - 99 mg/dL  Glucose, capillary     Status: Abnormal   Collection Time: 05/17/18  9:26 PM  Result Value Ref Range   Glucose-Capillary 160 (H) 65 - 99 mg/dL  Basic metabolic panel     Status: Abnormal   Collection Time: 05/18/18  3:43 AM  Result Value Ref Range   Sodium 140 135 - 145 mmol/L   Potassium 4.5 3.5 - 5.1 mmol/L   Chloride 108 101 - 111 mmol/L   CO2 26 22 - 32 mmol/L   Glucose, Bld 169 (H) 65 - 99 mg/dL   BUN 19 6 - 20 mg/dL   Creatinine, Ser 1.15 0.61 - 1.24 mg/dL   Calcium 8.3 (L) 8.9 - 10.3 mg/dL   GFR calc non Af Amer >60 >60 mL/min   GFR calc Af Amer >60 >60 mL/min    Comment: (NOTE) The eGFR has been calculated using the CKD EPI equation. This calculation has not been validated in all clinical situations. eGFR's persistently <60 mL/min signify possible Chronic Kidney Disease.    Anion gap 6 5 - 15    Comment: Performed at [facility], [place] [address] [place] [address], [place], [place] [address]  CBC     Status: Abnormal   Collection Time: 05/18/18  3:43 AM  Result Value Ref Range   WBC 13.1 (H) 4.0 - 10.5 K/uL   RBC 3.97 (L) 4.22 - 5.81 MIL/uL   Hemoglobin 12.2 (L) 13.0 - 17.0 g/dL   HCT 37.1 (L) 39.0 - 52.0 %   MCV 93.5 78.0 - 100.0 fL   MCH 30.7 26.0 - 34.0 pg   MCHC 32.9 30.0 - 36.0 g/dL   RDW 13.1 11.5 - 15.5 %   Platelets 291 150 - 400 K/uL    Comment: Performed at [facility], [address] [place],  [place] [address]  Glucose, capillary     Status: Abnormal   Collection Time: 05/18/18  8:19 AM  Result Value Ref Range   Glucose-Capillary 157 (H) 65 - 99 mg/dL  Glucose, capillary     Status: Abnormal   Collection Time: 05/18/18 12:04 PM  Result Value Ref Range   Glucose-Capillary 134 (H) 65 - 99 mg/dL  Glucose, capillary     Status: Abnormal   Collection Time: 05/18/18  4:59 PM  Result Value Ref Range   Glucose-Capillary 135 (H) 65 - 99 mg/dL  Glucose, capillary     Status: Abnormal   Collection Time: 05/18/18  8:06 PM  Result Value Ref Range   Glucose-Capillary 112 (H) 65 - 99 mg/dL  Glucose, capillary     Status: None   Collection Time: 05/19/18  7:32 AM  Result Value Ref Range   Glucose-Capillary 90 65 - 99 mg/dL    Radiology/Results: No results found.  Anti-infectives: Anti-infectives (From admission, onward)   Start     Dose/Rate Route Frequency Ordered Stop    05/17/18 1400  piperacillin-tazobactam (ZOSYN) IVPB 3.375 g     3.375 g 12.5 mL/hr over 240 Minutes Intravenous Every 8 hours 05/17/18 1351     05/17/18 0600  meropenem (MERREM) 1 g in sodium chloride 0.9 % 100 mL IVPB  Status:  Discontinued     1 g 200 mL/hr over 30 Minutes Intravenous Every 8 hours 05/17/18 0441 05/17/18 1348      Assessment/Plan: Problem List: Patient Active Problem List   Diagnosis Date Noted  . Pneumoperitoneum 05/17/2018  . Diverticulitis of colon with perforation s/p colectomy/ostomy 05/17/2018 05/17/2018  . S/P exploratory laparotomy 05/17/2018  . Diverticular disease   . Hypertension   . Hyperlipemia     Doing well; motivated and walking a lot.  Await ostomy activity.   2 Days Post-Op    LOS: 2 days   Matt B. Hassell Done, MD, [facility] [phone] beeper [phone]  05/19/2018 9:53 AM

## 2018-05-20 LAB — GLUCOSE, CAPILLARY
GLUCOSE-CAPILLARY: 84 mg/dL (ref 65–99)
GLUCOSE-CAPILLARY: 75 mg/dL (ref 65–99)
GLUCOSE-CAPILLARY: 75 mg/dL (ref 65–99)
Glucose-Capillary: 79 mg/dL (ref 65–99)

## 2018-05-20 MED ORDER — HYDROMORPHONE HCL 1 MG/ML IJ SOLN
0.5000 mg | INTRAMUSCULAR | Status: DC | PRN
  Administered 2018-05-21: 0.5 mg via INTRAVENOUS
  Administered 2018-05-21: 1 mg via INTRAVENOUS
  Administered 2018-05-21: 0.5 mg via INTRAVENOUS
  Administered 2018-05-22: 1 mg via INTRAVENOUS
  Filled 2018-05-20 (×4): qty 1

## 2018-05-20 MED ORDER — PHENOL 1.4 % MT LIQD
1.0000 | OROMUCOSAL | Status: DC | PRN
  Filled 2018-05-20: qty 177

## 2018-05-20 NOTE — Progress Notes (Signed)
Pharmacy Antibiotic Note  [name] is a 67 y.o. male admitted on 05/17/2018 with pneumoperitoneum, diverticular disease with perforation of sigmoid colon.  Pharmacy has been consulted for Zosyn dosing.  Allergies:  Augmentin allergy (hives) - removed 6/14 by MD.  Metronidazole (unknown).  Pt is post-surgery and unable to confirm/deny allergies.  Plan: Day 4 Zosyn - Continue current Zosyn dosing - labs and vitals stable - Will sign off - What is plan for length of therapy?   Height: 5\' 5"  (165.1 cm) Weight: 187 lb (84.8 kg) IBW/kg (Calculated) : 61.5  Temp (24hrs), Avg:97.9 F (36.6 C), Min:97.5 F (36.4 C), Max:98.3 F (36.8 C)  Recent Labs  Lab 05/17/18 0417 05/17/18 0456 05/18/18 0343  WBC 13.8*  --  13.1*  CREATININE 1.14  --  1.15  LATICACIDVEN  --  0.76  --     Estimated Creatinine Clearance: 64.1 mL/min (by C-G formula based on SCr of 1.15 mg/dL).    Allergies  Allergen Reactions  . Hibiclens [Chlorhexidine Gluconate] Rash    Pt. Had rash on torso after last surgery, mostly on surgical side.  Am listing CHG as potential allergy.  . Flagyl [Metronidazole]     Antimicrobials this admission: 6/14 Meropenem >> 6/14 6/14 Zosyn >>   Dose adjustments this admission:   Microbiology results: 6/14 BCx:   Thank you for allowing pharmacy to be a part of this patient's care.  Lynann Beaverhristine Shade PharmD, BCPS Pager [phone] 05/20/2018 8:26 AM

## 2018-05-20 NOTE — Consult Note (Signed)
WOC Nurse ostomy consult note Stoma type/location: LLQ Colostomy Stomal assessment/size: 2" edematous and dark, sloughing Peristomal assessment: Intact with midline abdominal incisions Treatment options for stomal/peristomal skin: Barrier ring Output Blood only in pouch at this time.  NG tube in place Ostomy pouching: 2pc. 2 3/4" with barrier ring  Education provided: POuch change with friend at bedside. Pouch removed.  Discussed twice weekly changes, showering and activity level.  Patient is an avid Horticulturist, commercialdancer (shagging).  Discussed avoidance of lifting, diet and increasing activity as tolerated.  Barrier ring and new pouching system applied.   Enrolled patient in [employer] DC program: No WOC team will follow and remain available to patient.  Maple HudsonKaren Earle Troiano RN BSN CWON Pager [phone]

## 2018-05-20 NOTE — Progress Notes (Signed)
[facility] Progress Note  3 Days Post-Op  Subjective: CC:  Mild incisional pain worse with coughing. Reports high NG tube output and minimal liquid in colostomy pouch. Mobilizing in hallway but needs help getting up out of bed.   Objective: Vital signs in last 24 hours: Temp:  [97.5 F (36.4 C)-98.3 F (36.8 C)] 97.9 F (36.6 C) (06/17 0929) Pulse Rate:  [48-52] 51 (06/17 0929) Resp:  [17-22] 19 (06/17 0921) BP: (156-182)/(72-94) 168/76 (06/17 0929) SpO2:  [94 %-99 %] 97 % (06/17 0929) Last BM Date: 05/16/18  Intake/Output from previous day: 06/16 0701 - 06/17 0700 In: 2400 [I.V.:2400] Out: 2860 [Urine:1450; Emesis/NG output:1400; Stool:10] Intake/Output this shift: Total I/O In: -  Out: 300 [Emesis/NG output:300]  PE: Gen:  Alert, NAD, pleasant Pulm:  Normal effort Abd: Soft, approp tender, mild distention. +BS, midline incision c/d/i granulating appropriately, there are 3 staples around umbilicus, no surrounding erythema or srainage, stoma viable with scant SS drainage in pouch and no gas. Skin: warm and dry, no rashes  Psych: A&Ox3   Lab Results:  Recent Labs    05/18/18 0343  WBC 13.1*  HGB 12.2*  HCT 37.1*  PLT 291   BMET Recent Labs    05/18/18 0343  NA 140  K 4.5  CL 108  CO2 26  GLUCOSE 169*  BUN 19  CREATININE 1.15  CALCIUM 8.3*   PT/INR No results for input(s): LABPROT, INR in the last 72 hours. CMP     Component Value Date/Time   NA 140 05/18/2018 0343   K 4.5 05/18/2018 0343   CL 108 05/18/2018 0343   CO2 26 05/18/2018 0343   GLUCOSE 169 (H) 05/18/2018 0343   BUN 19 05/18/2018 0343   CREATININE 1.15 05/18/2018 0343   CALCIUM 8.3 (L) 05/18/2018 0343   PROT 7.1 11/22/2017 1220   ALBUMIN 4.2 11/22/2017 1220   AST 20 11/22/2017 1220   ALT 20 11/22/2017 1220   ALKPHOS 38 11/22/2017 1220   BILITOT 1.0 11/22/2017 1220   GFRNONAA >60 05/18/2018 0343   GFRAA >60 05/18/2018 0343   Lipase     Component Value Date/Time   LIPASE 38 11/22/2017 1220       Studies/Results: No results found.  Anti-infectives: Anti-infectives (From admission, onward)   Start     Dose/Rate Route Frequency Ordered Stop   05/17/18 1400  piperacillin-tazobactam (ZOSYN) IVPB 3.375 g     3.375 g 12.5 mL/hr over 240 Minutes Intravenous Every 8 hours 05/17/18 1351     05/17/18 0600  meropenem (MERREM) 1 g in sodium chloride 0.9 % 100 mL IVPB  Status:  Discontinued     1 g 200 mL/hr over 30 Minutes Intravenous Every 8 hours 05/17/18 0441 05/17/18 1348       Assessment/Plan HTN HLD DM Gout Asthma Recent Hx bronchitis  Perforated sigmoid diverticulitis S/P exploratory laparotomy sigmoid colectomy end colostomy with hartmann's pouch 05/17/18 Dr. Gerrit FriendsGerkin -  POD#3, afebrile, VSS  -  Continue NG tube to LIWS and await bowel function -  BID wet-to-dry dressing changes  -  Ambulate TID, IS  -  D/C PCA and continue PRN toradol, robaxin, and PRN dilaudid   FEN: NPO, IVF, NG tube to LIWS ID: Zosyn 6/14 >>  VTE: SCD's, lovenox Follow up: Gerrit FriendsGerkin, MD     LOS: 3 days    Adam PhenixElizabeth S Simaan , [facility] 05/20/2018, 10:04 AM Pager: [phone] Consults: [phone] Mon-Fri 7:00 am-4:30 pm Sat-Sun 7:00 am-11:30 am

## 2018-05-20 NOTE — Plan of Care (Signed)
Plan of care discussed with patient 

## 2018-05-21 LAB — BASIC METABOLIC PANEL
Anion gap: 12 (ref 5–15)
BUN: 20 mg/dL (ref 6–20)
CHLORIDE: 108 mmol/L (ref 101–111)
CO2: 21 mmol/L — ABNORMAL LOW (ref 22–32)
Calcium: 9.1 mg/dL (ref 8.9–10.3)
Creatinine, Ser: 1.18 mg/dL (ref 0.61–1.24)
GFR calc Af Amer: >60 mL/min (ref >60)
GLUCOSE: 109 mg/dL — AB (ref 65–99)
POTASSIUM: 4.1 mmol/L (ref 3.5–5.1)
Sodium: 141 mmol/L (ref 135–145)

## 2018-05-21 LAB — GLUCOSE, CAPILLARY
GLUCOSE-CAPILLARY: 88 mg/dL (ref 65–99)
Glucose-Capillary: 135 mg/dL — ABNORMAL HIGH (ref 65–99)
Glucose-Capillary: 126 mg/dL — ABNORMAL HIGH (ref 65–99)
Glucose-Capillary: 108 mg/dL — ABNORMAL HIGH (ref 65–99)

## 2018-05-21 MED ORDER — ACETAMINOPHEN 500 MG PO TABS
1000.0000 mg | ORAL_TABLET | Freq: Three times a day (TID) | ORAL | Status: DC
Start: 2018-05-21 — End: 2018-05-22
  Administered 2018-05-21 – 2018-05-22 (×4): 1000 mg via ORAL
  Filled 2018-05-21 (×4): qty 2

## 2018-05-21 NOTE — Progress Notes (Signed)
[facility] Progress Note  4 Days Post-Op  Subjective: CC:  Endorses some abd pain that improves with meds. Now having flatus in colostomy pouch. Denies stool. Ambulating.   Objective: Vital signs in last 24 hours: Temp:  [97.7 F (36.5 C)-98.7 F (37.1 C)] 98 F (36.7 C) (06/18 0518) Pulse Rate:  [51-52] 52 (06/18 0518) Resp:  [16-18] 18 (06/18 0518) BP: (172-189)/(76-77) 172/77 (06/18 0518) SpO2:  [96 %-99 %] 96 % (06/18 0518) Last BM Date: 05/16/18  Intake/Output from previous day: 06/17 0701 - 06/18 0700 In: 3145.2 [P.O.:60; I.V.:2400; NG/GT:240; IV Piggyback:445.2] Out: 2975 [Urine:2275; Emesis/NG output:700] Intake/Output this shift: No intake/output data recorded.  PE: Gen:  Alert, NAD, pleasant Pulm:  Normal effort Abd: Soft, approp tender, mild distention. +BS, midline incision c/d/i, stoma viable but with some sloughing, gas in colostomy pouch Skin: warm and dry, no rashes  Psych: A&Ox3    Lab Results:  No results for input(s): WBC, HGB, HCT, PLT in the last 72 hours. BMET No results for input(s): NA, K, CL, CO2, GLUCOSE, BUN, CREATININE, CALCIUM in the last 72 hours. PT/INR No results for input(s): LABPROT, INR in the last 72 hours. CMP     Component Value Date/Time   NA 140 05/18/2018 0343   K 4.5 05/18/2018 0343   CL 108 05/18/2018 0343   CO2 26 05/18/2018 0343   GLUCOSE 169 (H) 05/18/2018 0343   BUN 19 05/18/2018 0343   CREATININE 1.15 05/18/2018 0343   CALCIUM 8.3 (L) 05/18/2018 0343   PROT 7.1 11/22/2017 1220   ALBUMIN 4.2 11/22/2017 1220   AST 20 11/22/2017 1220   ALT 20 11/22/2017 1220   ALKPHOS 38 11/22/2017 1220   BILITOT 1.0 11/22/2017 1220   GFRNONAA >60 05/18/2018 0343   GFRAA >60 05/18/2018 0343   Lipase     Component Value Date/Time   LIPASE 38 11/22/2017 1220       Studies/Results: No results found.  Anti-infectives: Anti-infectives (From admission, onward)   Start     Dose/Rate Route Frequency Ordered  Stop   05/17/18 1400  piperacillin-tazobactam (ZOSYN) IVPB 3.375 g     3.375 g 12.5 mL/hr over 240 Minutes Intravenous Every 8 hours 05/17/18 1351     05/17/18 0600  meropenem (MERREM) 1 g in sodium chloride 0.9 % 100 mL IVPB  Status:  Discontinued     1 g 200 mL/hr over 30 Minutes Intravenous Every 8 hours 05/17/18 0441 05/17/18 1348     Assessment/Plan HTN HLD DM Gout Asthma Recent Hx bronchitis  Perforated sigmoid diverticulitis S/P exploratory laparotomy sigmoid colectomy end colostomy with hartmann's pouch 05/17/18 Dr. Gerrit FriendsGerkin -  POD#4, afebrile, VSS  -  having flatus, await BM - D/C NG tube and start clears -  BID wet-to-dry dressing changes  -  Ambulate TID, IS  -  Scheduled tylenol, continue PRN toradol, robaxin, and PRN dilaudid   FEN: NPO, IVF, NG tube to LIWS ID: Zosyn 6/14 >> (Day#4); Plan to d/c tomorrow if afebrile and WBC normalized. VTE: SCD's, lovenox Foley: D/C today 6/18  Follow up: Gerrit FriendsGerkin, MD     LOS: 4 days    Adam PhenixElizabeth S Danial Hlavac , [facility] 05/21/2018, 10:11 AM Pager: [phone] Consults: [phone] Mon-Fri 7:00 am-4:30 pm Sat-Sun 7:00 am-11:30 am

## 2018-05-21 NOTE — Care Management Important Message (Signed)
Important Message  Patient Details  Name: Elenore RotaLuis Colasanti MRN: [id number] Date of Birth: July 18, 1951   Medicare Important Message Given:  Yes    Caren MacadamFuller, Sanjna Haskew 05/21/2018, 10:25 AMImportant Message  Patient Details  Name: Elenore RotaLuis Rasnic MRN: [id number] Date of Birth: July 18, 1951   Medicare Important Message Given:  Yes    Caren MacadamFuller, Shereka Lafortune 05/21/2018, 10:24 AM

## 2018-05-22 LAB — CBC
HEMATOCRIT: 37.8 % — AB (ref 39.0–52.0)
Hemoglobin: 13 g/dL (ref 13.0–17.0)
MCH: 31.3 pg (ref 26.0–34.0)
MCHC: 34.4 g/dL (ref 30.0–36.0)
MCV: 91.1 fL (ref 78.0–100.0)
PLATELETS: 341 10*3/uL (ref 150–400)
RBC: 4.15 MIL/uL — AB (ref 4.22–5.81)
RDW: 12.7 % (ref 11.5–15.5)
WBC: 8 10*3/uL (ref 4.0–10.5)

## 2018-05-22 LAB — GLUCOSE, CAPILLARY
Glucose-Capillary: 101 mg/dL — ABNORMAL HIGH (ref 65–99)
Glucose-Capillary: 152 mg/dL — ABNORMAL HIGH (ref 65–99)

## 2018-05-22 LAB — CULTURE, BLOOD (ROUTINE X 2)
CULTURE: NO GROWTH
Culture: NO GROWTH
SPECIAL REQUESTS: ADEQUATE
Special Requests: ADEQUATE

## 2018-05-22 MED ORDER — OXYCODONE HCL 5 MG PO TABS: 5.0000 mg | ORAL_TABLET | ORAL | 0 refills | Status: DC | PRN

## 2018-05-22 MED ORDER — ACETAMINOPHEN 500 MG PO TABS: 1000.0000 mg | ORAL_TABLET | Freq: Three times a day (TID) | ORAL | 0 refills | Status: DC | PRN

## 2018-05-22 MED ORDER — FAMOTIDINE 20 MG PO TABS
20.0000 mg | ORAL_TABLET | Freq: Two times a day (BID) | ORAL | Status: DC
  Administered 2018-05-22: 20 mg via ORAL
  Filled 2018-05-22: qty 1

## 2018-05-22 NOTE — Discharge Summary (Signed)
[facility] Discharge Summary   Patient ID: Elenore RotaLuis Lemaire MRN: [id number] DOB/AGE: Jan 13, 1951 67 y.o.  Admit date: 05/17/2018 Discharge date: 05/22/2018  Discharge Diagnosis Patient Active Problem List   Diagnosis Date Noted  . Pneumoperitoneum 05/17/2018  . Diverticulitis of colon with perforation s/p colectomy/ostomy 05/17/2018 05/17/2018  . S/P exploratory laparotomy 05/17/2018  . Diverticular disease   . Hypertension   . Hyperlipemia    Consultants WOC RN  Imaging: CT RENAL STONE 05/17/18 -  IMPRESSION: 1. Acute diverticulitis with large volume pneumoperitoneum, consistent with perforation. When the acute symptoms have resolved, colonoscopy should be considered to exclude neoplasm at this location, as this is the same site of the patient's prior episode of diverticulitis. 2.  Aortic Atherosclerosis (ICD10-I70.0).  Procedures Dr. Darnell Levelodd Gerkin (05/17/18) - exploratory laparotomy, sigmoid colectomy, colostomy   Hospital Course:  Mr. Shawnie DapperLopez is a 67 year old male with a past medical history of asthma, diverticular disease, hypertension, hyperlipidemia, and seasonal allergies who [facility]14 with a chief complaint of suprapubic abdominal pain. [facility] significant for a leukocytosis of over 13,000 and a CT scan of the abdomen and pelvis was significant for inflammation of the sigmoid colon and a large amount of free air.  Lactate normal.  UA normal. [facility]. He underwent the above operation. Surgical pathology revealed sigmoid diverticulitis with perforation. The patient was transferred to the floor post-operatively. Once bowel function returned diet was advanced as tolerated. WOC RN was consulted for assistance and patient education regarding new colostomy. On 05/22/18 the patients vitals were stable, tolerating PO, having bowel function, pain controlled, mobilizing, and  medically stable for discharge home. He will follow up as below.   Allergies as of 05/22/2018      Reactions   Hibiclens [chlorhexidine Gluconate] Rash   Pt. Had rash on torso after last surgery, mostly on surgical side.  Am listing CHG as potential allergy.   Flagyl [metronidazole]       Medication List    STOP taking these medications   amLODipine 5 MG tablet Commonly known as:  NORVASC   amoxicillin-clavulanate 875-125 MG tablet Commonly known as:  AUGMENTIN   HYDROcodone-acetaminophen 5-325 MG tablet Commonly known as:  NORCO   metFORMIN 1000 MG tablet Commonly known as:  GLUCOPHAGE   naproxen 500 MG tablet Commonly known as:  NAPROSYN   predniSONE 20 MG tablet Commonly known as:  DELTASONE     TAKE these medications   acetaminophen 500 MG tablet Commonly known as:  TYLENOL Take 2 tablets (1,000 mg total) by mouth every 8 (eight) hours as needed.   albuterol 108 (90 Base) MCG/ACT inhaler Commonly known as:  PROVENTIL HFA;VENTOLIN HFA Inhale 2 puffs into the lungs every 6 (six) hours as needed for wheezing.   allopurinol 300 MG tablet Commonly known as:  ZYLOPRIM Take 300 mg by mouth daily.   allopurinol 100 MG tablet Commonly known as:  ZYLOPRIM Take 100 mg by mouth daily.   aspirin 81 MG tablet Take 81 mg by mouth daily.   fenofibrate 160 MG tablet Take 160 mg by mouth daily.   HYDROMET 5-1.5 MG/5ML syrup Generic drug:  HYDROcodone-homatropine Take 5 mLs by mouth every 6 (six) hours as needed for cough.   lisinopril 40 MG tablet Commonly known as:  PRINIVIL,ZESTRIL Take 40 mg by mouth daily.   oxyCODONE 5 MG immediate release tablet Commonly known as:  Oxy IR/ROXICODONE  Take 1 tablet (5 mg total) by mouth every 4 (four) hours as needed for moderate pain.   rosuvastatin 20 MG tablet Commonly known as:  CRESTOR Take 20 mg by mouth at bedtime.        Follow-up Information    [facility] Follow [facility].   Specialty:  Home  Health Services Why:  nurse to assist with ostomy care Contact information: [address] [place] [address] [phone]        Darnell Level, MD. Schedule an appointment as soon as possible for a visit.   Specialty:  General Surgery Why:  in 2-4 weeks for post-operative follow up.  Contact information: [address] [place] [address] [phone]        [facility], [place]. Go on 05/31/2018.   Specialty:  General Surgery Why:  at 3:00 PM for staple removal by a nurse. please arrive 30 minutes early to get checked in and fill out any necessary paperwork. Contact information: [address] [place] [address] [phone]          Signed: Hosie Spangle, [facility] 05/22/2018, 3:09 PM Pager: [phone] Consults: [phone] Mon-Fri 7:00 am-4:30 pm Sat-Sun 7:00 am-11:30 am

## 2018-05-22 NOTE — Progress Notes (Signed)
[facility] Progress Note  5 Days Post-Op  Subjective: CC: Pain controlled. Having gas and stool in colostomy pouch. Tolerating clears. Changed his own ostomy appliance today.  Objective: Vital signs in last 24 hours: Temp:  [98.1 F (36.7 C)-99.1 F (37.3 C)] 98.1 F (36.7 C) (06/19 0545) Pulse Rate:  [46-56] 46 (06/19 0545) Resp:  [17-18] 17 (06/19 0545) BP: (122-164)/(69-91) 122/69 (06/19 0545) SpO2:  [96 %-99 %] 96 % (06/19 0545) Last BM Date: 05/16/18  Intake/Output from previous day: 06/18 0701 - 06/19 0700 In: 3494.4 [P.O.:1320; I.V.:1961.7; IV Piggyback:212.7] Out: 1625 [Urine:1050; Stool:575] Intake/Output this shift: Total I/O In: 198.3 [I.V.:198.3] Out: 700 [Urine:700]  PE: Gen:  Alert, NAD, pleasant Pulm:  Normal effort Abd: Soft, approp tender, stoma dusky but viable with flatus in pouch, midline incision c/d/i -- granulating nicely and without erythema or drainage. Dressing changed with patient and his partner today.  Skin: warm and dry, no rashes  Psych: A&Ox3   Lab Results:  Recent Labs    05/22/18 0459  WBC 8.0  HGB 13.0  HCT 37.8*  PLT 341   BMET Recent Labs    05/21/18 1050  NA 141  K 4.1  CL 108  CO2 21*  GLUCOSE 109*  BUN 20  CREATININE 1.18  CALCIUM 9.1   PT/INR No results for input(s): LABPROT, INR in the last 72 hours. CMP     Component Value Date/Time   NA 141 05/21/2018 1050   K 4.1 05/21/2018 1050   CL 108 05/21/2018 1050   CO2 21 (L) 05/21/2018 1050   GLUCOSE 109 (H) 05/21/2018 1050   BUN 20 05/21/2018 1050   CREATININE 1.18 05/21/2018 1050   CALCIUM 9.1 05/21/2018 1050   PROT 7.1 11/22/2017 1220   ALBUMIN 4.2 11/22/2017 1220   AST 20 11/22/2017 1220   ALT 20 11/22/2017 1220   ALKPHOS 38 11/22/2017 1220   BILITOT 1.0 11/22/2017 1220   GFRNONAA >60 05/21/2018 1050   GFRAA >60 05/21/2018 1050   Lipase     Component Value Date/Time   LIPASE 38 11/22/2017 1220       Studies/Results: No  results found.  Anti-infectives: Anti-infectives (From admission, onward)   Start     Dose/Rate Route Frequency Ordered Stop   05/17/18 1400  piperacillin-tazobactam (ZOSYN) IVPB 3.375 g  Status:  Discontinued     3.375 g 12.5 mL/hr over 240 Minutes Intravenous Every 8 hours 05/17/18 1351 05/22/18 0808   05/17/18 0600  meropenem (MERREM) 1 g in sodium chloride 0.9 % 100 mL IVPB  Status:  Discontinued     1 g 200 mL/hr over 30 Minutes Intravenous Every 8 hours 05/17/18 0441 05/17/18 1348     Assessment/Plan HTN HLD DM Gout Asthma Recent Hx bronchitis  Perforated sigmoid diverticulitis S/P exploratory laparotomy sigmoid colectomy end colostomy with hartmann's pouch 05/17/18 Dr. Gerrit FriendsGerkin - POD#5, afebrile, VSS  - having flatus and stool in colostomy pouch -  Advance diet to SOFT  - BID wet-to-dry dressing changes  - Ambulate TID, IS  -  Scheduled tylenol, continue PRN toradol, robaxin  FEN: full liquids >> SOFT at lunch. Saline lock IV.  ID: Zosyn 6/14 >>(Day#4); Plan to d/c tomorrow if afebrile and WBC normalized. VTE: SCD's, lovenox Foley: D/C today 6/18  Follow up: Gerrit FriendsGerkin, MD  Plan: lock IV, advance diet, possible PM discharge    LOS: 5 days    Adam PhenixElizabeth S Natiya Seelinger , [facility] 05/22/2018, 10:29 AM Pager: [phone] Consults:  [phone] Mon-Fri 7:00 am-4:30 pm Sat-Sun 7:00 am-11:30 am

## 2018-05-22 NOTE — Plan of Care (Signed)
Patient discharged home in stable condition 

## 2018-05-22 NOTE — Discharge Instructions (Signed)
[facility]      [facility] [phone]  OPEN ABDOMINAL SURGERY: POST OP INSTRUCTIONS  Always review your discharge instruction sheet given to you by the facility where your surgery was performed.  IF YOU HAVE DISABILITY OR FAMILY LEAVE FORMS, YOU MUST BRING THEM TO THE OFFICE FOR PROCESSING.  PLEASE DO NOT GIVE THEM TO YOUR DOCTOR.  1. A prescription for pain medication may be given to you upon discharge.  Take your pain medication as prescribed, if needed.  If narcotic pain medicine is not needed, then you may take acetaminophen (Tylenol) or ibuprofen (Advil) as needed. 2. Take your usually prescribed medications unless otherwise directed. 3. If you need a refill on your pain medication, please contact your pharmacy. They will contact our office to request authorization.  Prescriptions will not be filled after 5pm or on week-ends. 4. You should follow a light diet the first few days after arrival home, such as soup and crackers, pudding, etc.unless your doctor has advised otherwise. A high-fiber, low fat diet can be resumed as tolerated.   Be sure to include lots of fluids daily. Most patients will experience some swelling and bruising on the chest and neck area.  Ice packs will help.  Swelling and bruising can take several days to resolve 5. Most patients will experience some swelling and bruising in the area of the incision. Ice pack will help. Swelling and bruising can take several days to resolve..  6. It is common to experience some constipation if taking pain medication after surgery.  Increasing fluid intake and taking a stool softener will usually help or prevent this problem from occurring.  A mild laxative (Milk of Magnesia or Miralax) should be taken according to package directions if there are no bowel movements after 48 hours. 7.  You may have steri-strips (small skin tapes) in place directly over the incision.  These strips should be left on the skin for 7-10 days.  If your  surgeon used skin glue on the incision, you may shower in 24 hours.  The glue will flake off over the next 2-3 weeks.  Any sutures or staples will be removed at the office during your follow-up visit. You may find that a light gauze bandage over your incision may keep your staples from being rubbed or pulled. You may shower and replace the bandage daily. 8. ACTIVITIES:  You may resume regular (light) daily activities beginning the next day--such as daily self-care, walking, climbing stairs--gradually increasing activities as tolerated.  You may have sexual intercourse when it is comfortable.  Refrain from any heavy lifting or straining until approved by your doctor. a. You may drive when you no longer are taking prescription pain medication, you can comfortably wear a seatbelt, and you can safely maneuver your car and apply brakes b. Return to Work: ___________________________________ 9. You should see your doctor in the office for a follow-up appointment approximately two weeks after your surgery.  Make sure that you call for this appointment within a day or two after you arrive home to insure a convenient appointment time. OTHER INSTRUCTIONS:  _____________________________________________________________ _____________________________________________________________  WHEN TO CALL YOUR DOCTOR: 1. Fever over 101.0 2. Inability to urinate 3. Nausea and/or vomiting 4. Extreme swelling or bruising 5. Continued bleeding from incision. 6. Increased pain, redness, or drainage from the incision. 7. Difficulty swallowing or breathing 8. Muscle cramping or spasms. 9. Numbness or tingling in hands or feet or around lips.  The clinic staff is available to   answer your questions during regular business hours.  Please don't hesitate to call and ask to speak to one of the nurses if you have concerns.  For further questions, please visit [email or web address]   

## 2018-05-22 NOTE — Progress Notes (Signed)
Discharge planning, spoke with patient and spouse at beside. [facility] RN to assist with ostomy care. Contacted [facility] for referral, they accepted. [phone]

## 2018-05-22 NOTE — Progress Notes (Signed)
PT Cancellation Note  Patient Details Name: [name] MRN: [id number] DOB: Apr 07, 1951   Cancelled Treatment:    Reason Eval/Treat Not Completed: PT screened, no needs identified, will sign off   Rada HayHill, Dicky Boer Elizabeth 05/22/2018, 12:24 PM

## 2018-05-22 NOTE — Consult Note (Signed)
WOC Nurse ostomy follow up Stoma type/location: LLQ colostomy Stomal assessment/size: 1 3/4" less edematous today. Remains dark. Producing soft brown stool Peristomal assessment: intact Treatment options for stomal/peristomal skin: barrier ring Output soft brown stool Ostomy pouching: 2pc. Pouch.  Education provided:  Patient removed old pouch, measured and cut new barrier to fit.  Applied barrier ring and applied pouch.  Has been up to bathroom and emptied independently.  His friend is at bedside this AM and they both feel more comfortable with his self care.  Patient is retired but very active.  He is an avid Glass blower/designershagger, works out regularly.  Discussed advancing activities slowly, no lifting over 10# until MD clears.  Twice weekly pouch changes.   [place] [facility]: Yes today.  WOC team will follow.  Maple HudsonKaren Prosperity Darrough RN BSN CWON Pager [phone]

## 2018-05-27 ENCOUNTER — Encounter (HOSPITAL_COMMUNITY): Payer: Self-pay | Admitting: Emergency Medicine

## 2018-05-27 ENCOUNTER — Emergency Department (HOSPITAL_COMMUNITY): Payer: Medicare Other

## 2018-05-27 ENCOUNTER — Emergency Department (HOSPITAL_COMMUNITY)
Admission: EM | Admit: 2018-05-27 | Discharge: 2018-05-28 | Disposition: A | Discharge status: Home / Self Care | Payer: Medicare Other | Attending: Emergency Medicine | Admitting: Emergency Medicine

## 2018-05-27 DIAGNOSIS — R0781 Pleurodynia: Secondary | ICD-10-CM | POA: Insufficient documentation

## 2018-05-27 DIAGNOSIS — Z79899 Other long term (current) drug therapy: Secondary | ICD-10-CM | POA: Diagnosis not present

## 2018-05-27 DIAGNOSIS — I10 Essential (primary) hypertension: Secondary | ICD-10-CM | POA: Insufficient documentation

## 2018-05-27 DIAGNOSIS — Z87891 Personal history of nicotine dependence: Secondary | ICD-10-CM | POA: Diagnosis not present

## 2018-05-27 DIAGNOSIS — J45909 Unspecified asthma, uncomplicated: Secondary | ICD-10-CM | POA: Diagnosis not present

## 2018-05-27 DIAGNOSIS — R748 Abnormal levels of other serum enzymes: Secondary | ICD-10-CM

## 2018-05-27 DIAGNOSIS — R9431 Abnormal electrocardiogram [ECG] [EKG]: Secondary | ICD-10-CM | POA: Insufficient documentation

## 2018-05-27 LAB — URINALYSIS, ROUTINE W REFLEX MICROSCOPIC
BILIRUBIN URINE: NEGATIVE
Glucose, UA: NEGATIVE mg/dL
HGB URINE DIPSTICK: NEGATIVE
Ketones, ur: NEGATIVE mg/dL
Leukocytes, UA: NEGATIVE
Nitrite: NEGATIVE
PH: 6 (ref 5.0–8.0)
Protein, ur: NEGATIVE mg/dL
SPECIFIC GRAVITY, URINE: 1.009 (ref 1.005–1.030)

## 2018-05-27 LAB — I-STAT TROPONIN, ED: Troponin i, poc: 0.01 ng/mL (ref 0.00–0.08)

## 2018-05-27 LAB — LIPASE, BLOOD: Lipase: 125 U/L — ABNORMAL HIGH (ref 11–51)

## 2018-05-27 LAB — COMPREHENSIVE METABOLIC PANEL
ALBUMIN: 3.9 g/dL (ref 3.5–5.0)
ALK PHOS: 54 U/L (ref 38–126)
ALT: 25 U/L (ref 17–63)
ANION GAP: 9 (ref 5–15)
AST: 18 U/L (ref 15–41)
BUN: 23 mg/dL — ABNORMAL HIGH (ref 6–20)
CO2: 28 mmol/L (ref 22–32)
Calcium: 9.5 mg/dL (ref 8.9–10.3)
Chloride: 100 mmol/L — ABNORMAL LOW (ref 101–111)
Creatinine, Ser: 1.22 mg/dL (ref 0.61–1.24)
GFR calc Af Amer: >60 mL/min (ref >60)
GFR calc non Af Amer: >60 mL/min (ref >60)
GLUCOSE: 107 mg/dL — AB (ref 65–99)
POTASSIUM: 4.1 mmol/L (ref 3.5–5.1)
SODIUM: 137 mmol/L (ref 135–145)
Total Bilirubin: 0.4 mg/dL (ref 0.3–1.2)
Total Protein: 7.5 g/dL (ref 6.5–8.1)

## 2018-05-27 LAB — CBC
HEMATOCRIT: 40.6 % (ref 39.0–52.0)
Hemoglobin: 13.6 g/dL (ref 13.0–17.0)
MCH: 31.5 pg (ref 26.0–34.0)
MCHC: 33.5 g/dL (ref 30.0–36.0)
MCV: 94 fL (ref 78.0–100.0)
Platelets: 464 10*3/uL — ABNORMAL HIGH (ref 150–400)
RBC: 4.32 MIL/uL (ref 4.22–5.81)
RDW: 12.9 % (ref 11.5–15.5)
WBC: 9 10*3/uL (ref 4.0–10.5)

## 2018-05-27 MED ORDER — IOPAMIDOL (ISOVUE-370) INJECTION 76%
INTRAVENOUS | Status: AC
  Filled 2018-05-27: qty 100

## 2018-05-27 MED ORDER — IOPAMIDOL (ISOVUE-370) INJECTION 76%
100.0000 mL | Freq: Once | INTRAVENOUS | Status: AC | PRN
  Administered 2018-05-27: 100 mL via INTRAVENOUS

## 2018-05-27 MED ORDER — MORPHINE SULFATE (PF) 2 MG/ML IV SOLN: 2.0000 mg | Freq: Once | INTRAVENOUS | Status: DC

## 2018-05-27 MED ORDER — OXYCODONE-ACETAMINOPHEN 5-325 MG PO TABS
1.0000 | ORAL_TABLET | Freq: Once | ORAL | Status: AC
  Administered 2018-05-27: 1 via ORAL
  Filled 2018-05-27: qty 1

## 2018-05-27 NOTE — ED Triage Notes (Signed)
Pt reports had perferated colon and diverticulitis and had surgery for it last week and now has ostomy.  Pt c/o flank pain on right side just when breathing in deep that started 3 days ago. Pt denies any issues with urination.

## 2018-05-27 NOTE — Discharge Instructions (Addendum)
Continue taking your home medications as prescribed.  Follow-up with your general surgeon as scheduled.  Follow-up with your cardiologist for reevaluation of your abnormal EKG.  You may also follow-up with your primary care physician for reevaluation of your elevated lipase.  Return to the emergency department immediately for any concerning signs or symptoms develop such as fevers, productive cough, worsening chest pain or shortness of breath, persistent vomiting, or evidence of infection.

## 2018-05-27 NOTE — ED Provider Notes (Signed)
[facility]-EMERGENCY DEPT Provider Note   CSN: [id number] Arrival date & time: 05/27/18  1422     History   Chief Complaint Chief Complaint  Patient presents with  . Flank Pain    HPI [name] is a 67 y.o. male with history of arthritis, asthma, back pain, diabetes mellitus, hypertension, hyperlipidemia presents for evaluation of acute onset, progressively worsening right flank/chest pain for 3 days.He is 10 days status post exploratory laparotomy, sigmoid colectomy and colostomy with Dr. Gerrit Friends after being found to have a perforated viscus secondary to diverticulitis.  He states that he tolerated the procedure well and was found to be stable for discharge home.  He states that for the past 3 days he experiences a sharp pain every time he takes a deep breath along the right side of the chest.  He denies shortness of breath, fever, or cough.  Pain is not exertional.  He denies any significant abdominal pain, no nausea or vomiting.  He has been taking his home oxycodone without significant relief of his symptoms.  He called his general surgeon's office earlier today and was instructed to go to the ED for further evaluation.  He denies leg swelling, no prior history of DVT or PE, he is not on testosterone hormonal placement therapy.  The history is provided by the patient.    Past Medical History:  Diagnosis Date  . Arthritis   . Asthma   . Back pain   . Diabetes mellitus   . Diverticular disease   . Environmental allergies   . Hyperlipemia   . Hypertension     Patient Active Problem List   Diagnosis Date Noted  . Pneumoperitoneum 05/17/2018  . Diverticulitis of colon with perforation s/p colectomy/ostomy 05/17/2018 05/17/2018  . S/P exploratory laparotomy 05/17/2018  . Diverticular disease   . Hypertension   . Hyperlipemia     Past Surgical History:  Procedure Laterality Date  . ACHILLES TENDON REPAIR  1974   left  . COLON RESECTION N/A 05/17/2018   Procedure: EXPLORATORY LAPAROTOMY SIGMOID COLECTOMY AND  COLOSTOMY;  Surgeon: Darnell Level, MD;  Location: [facility];  Service: General;  Laterality: N/A;  . COLONOSCOPY    . KIDNEY STONE SURGERY    . KNEE ARTHROSCOPY     rt  . SHOULDER ARTHROSCOPY  8/12   right  . TRIGGER FINGER RELEASE  11/17/2011   Procedure: RELEASE TRIGGER FINGER/A-1 PULLEY;  Surgeon: Wyn Forster., MD;  Location: [facility];  Service: Orthopedics;  Laterality: Left;  RELEASE LEFT LONG TRIGGER FINGER  . TRIGGER FINGER RELEASE  12/12   lt long finger  . TUMOR REMOVAL     rt neck/chin        Home Medications    Prior to Admission medications   Medication Sig Start Date End Date Taking? Authorizing Provider  acetaminophen (TYLENOL) 500 MG tablet Take 2 tablets (1,000 mg total) by mouth every 8 (eight) hours as needed. 05/22/18  Yes Simaan, Francine Graven, PA-C  albuterol (PROVENTIL HFA;VENTOLIN HFA) 108 (90 BASE) MCG/ACT inhaler Inhale 2 puffs into the lungs every 6 (six) hours as needed for wheezing.    Yes [provider]  allopurinol (ZYLOPRIM) 100 MG tablet Take 100 mg by mouth daily.   Yes [provider]  allopurinol (ZYLOPRIM) 300 MG tablet Take 300 mg by mouth daily.   Yes [provider]  aspirin 81 MG tablet Take 81 mg by mouth daily.   Yes [provider]  fenofibrate 160 MG tablet Take 160 mg by mouth daily.   Yes [provider]  hydroxypropyl methylcellulose / hypromellose (ISOPTO TEARS / GONIOVISC) 2.5 % ophthalmic solution Place 1 drop into both eyes as needed for dry eyes.   Yes [provider]  lisinopril (PRINIVIL,ZESTRIL) 40 MG tablet Take 40 mg by mouth daily. 05/01/18  Yes [provider]  oxyCODONE (OXY IR/ROXICODONE) 5 MG immediate release tablet Take 1 tablet (5 mg total) by mouth every 4 (four) hours as needed for moderate pain. 05/22/18  Yes Simaan, Francine GravenElizabeth S, PA-C  rosuvastatin (CRESTOR) 20 MG tablet Take 20 mg by  mouth at bedtime.   Yes [provider]    Family History No family history on file.  Social History Social History   Tobacco Use  . Smoking status: Former Smoker    Last attempt to quit: 03/25/2010    Years since quitting: 8.1  . Smokeless tobacco: Never Used  Substance Use Topics  . Alcohol use: Yes    Comment: occ  . Drug use: No     Allergies   Hibiclens [chlorhexidine gluconate] and Flagyl [metronidazole]   Review of Systems Review of Systems  Constitutional: Negative for chills and fever.  Respiratory: Negative for shortness of breath.   Cardiovascular: Positive for chest pain.  Gastrointestinal: Negative for abdominal pain, nausea and vomiting.  All other systems reviewed and are negative.    Physical Exam Updated Vital Signs BP 120/66   Pulse (!) 48   Temp 98 F (36.7 C) (Oral)   Resp (!) 9   SpO2 98%   Physical Exam  Constitutional: He appears well-developed and well-nourished. No distress.  HENT:  Head: Normocephalic and atraumatic.  Eyes: Conjunctivae are normal. Right eye exhibits no discharge. Left eye exhibits no discharge.  Neck: Normal range of motion. Neck supple. No JVD present. No tracheal deviation present.  Cardiovascular: Normal rate, regular rhythm, normal heart sounds and intact distal pulses.  2+ radial and DP/PT pulses bilaterally, Homans sign absent bilaterally, no lower extremity edema, no palpable cords, compartments are soft   Pulmonary/Chest: Effort normal. He exhibits no tenderness.  Equal rise and fall of chest, no increased work of breathing, speaking in full sentences without difficulty.  No tenderness palpation of the chest wall.  Diminished breath sounds in the right posterior lung base.  Abdominal: Soft. Bowel sounds are normal. He exhibits no distension. There is no tenderness.  Well-healing midline surgical incision of the lower abdomen.  Ostomy bag is in place in the left lower quadrant with no surrounding  erythema, fluctuance, abnormal drainage, or tenderness to palpation.  Good stool output.  Musculoskeletal: He exhibits no edema.  Neurological: He is alert.  Skin: Skin is warm and dry. No erythema.  Psychiatric: He has a normal mood and affect. His behavior is normal.  Nursing note and vitals reviewed.    ED Treatments / Results  Labs (all labs ordered are listed, but only abnormal results are displayed) Labs Reviewed  LIPASE, BLOOD - Abnormal; Notable for the following components:      Result Value   Lipase 125 (*)    All other components within normal limits  COMPREHENSIVE METABOLIC PANEL - Abnormal; Notable for the following components:   Chloride 100 (*)    Glucose, Bld 107 (*)    BUN 23 (*)    All other components within normal limits  CBC - Abnormal; Notable for the following components:   Platelets 464 (*)  All other components within normal limits  URINALYSIS, ROUTINE W REFLEX MICROSCOPIC - Abnormal; Notable for the following components:   Color, Urine STRAW (*)    All other components within normal limits  I-STAT TROPONIN, ED    EKG EKG Interpretation  Date/Time:  Monday May 27 2018 19:28:51 EDT Ventricular Rate:  54 PR Interval:    QRS Duration: 162 QT Interval:  466 QTC Calculation: 442 R Axis:   -5 Text Interpretation:  Sinus rhythm Left bundle branch block Left bundle branch block is new Confirmed by Linwood Dibbles [phone]) on 05/27/2018 8:03:53 PM Also confirmed by Linwood Dibbles [phone]), editor Elita Quick [phone])  on 05/28/2018 7:12:33 AM   Radiology Dg Chest 2 View  Result Date: 05/27/2018 CLINICAL DATA:  Chest pain EXAM: CHEST - 2 VIEW COMPARISON:  05/02/2017 FINDINGS: Normal heart size. Lungs clear. No pneumothorax. No pleural effusion. IMPRESSION: No active cardiopulmonary disease. Electronically Signed   By: Jolaine Click M.D.   On: 05/27/2018 19:03   Ct Angio Chest Pe W And/or Wo Contrast  Result Date: 05/27/2018 CLINICAL DATA:  Right flank  pain. History of perforated diverticulitis with surgery last week. Currently has ostomy. EXAM: CT ANGIOGRAPHY CHEST WITH CONTRAST TECHNIQUE: Multidetector CT imaging of the chest was performed using the standard protocol during bolus administration of intravenous contrast. Multiplanar CT image reconstructions and MIPs were obtained to evaluate the vascular anatomy. CONTRAST:  ISOVUE-370 IOPAMIDOL (ISOVUE-370) INJECTION 76% COMPARISON:  05/17/2018 FINDINGS: Cardiovascular: Conventional branch pattern of the great vessels. Atherosclerotic nonaneurysmal thoracic aorta. Coronary arteriosclerosis along the LAD. Top normal heart size without pericardial effusion or thickening. No acute pulmonary embolus. Mediastinum/Nodes: No enlarged mediastinal, hilar, or axillary lymph nodes. Thyroid gland, trachea, and esophagus demonstrate no significant findings. Lungs/Pleura: Bibasilar atelectasis. No pulmonary consolidation, effusion or pneumothorax. No dominant mass. Upper Abdomen: No pneumoperitoneum identified in the upper abdomen. No abnormal fluid collection. No acute abnormality identified. Musculoskeletal: No chest wall abnormality. No acute or significant osseous findings. Review of the MIP images confirms the above findings. IMPRESSION: 1. Coronary arteriosclerosis noted of the LAD. 2. Aortic atherosclerosis without aneurysm or dissection. 3. No acute pulmonary embolus. 4. No active pulmonary disease. Aortic Atherosclerosis (ICD10-I70.0). Electronically Signed   By: Tollie Eth M.D.   On: 05/27/2018 22:07    Procedures Procedures (including critical care time)  Medications Ordered in ED Medications  oxyCODONE-acetaminophen (PERCOCET/ROXICET) 5-325 MG per tablet 1 tablet (1 tablet Oral Given 05/27/18 1923)  iopamidol (ISOVUE-370) 76 % injection 100 mL (100 mLs Intravenous Contrast Given 05/27/18 2139)     Initial Impression / Assessment and Plan / ED Course  I have reviewed the triage vital signs and the  nursing notes.  Pertinent labs & imaging results that were available during my care of the patient were reviewed by me and considered in my medical decision making (see chart for details).     Patient presents for evaluation of 3-day history of right-sided pleuritic chest pain.  He is 10 days status post ex lap with colectomy.  Abdomen is nontender, no peritoneal signs.  No signs of secondary skin infection around his colostomy and surgical incision.  Pain is not reproducible on palpation.  Chest x-ray shows no acute cardiopulmonary abnormalities.  He denies shortness of breath, however with pleuritic chest pain and recent surgery he is high risk for PE.  We will obtain CTA for further evaluation.  CTA shows coronary arterial sclerosis in the LAD, no evidence of aneurysm or dissection, no evidence of  acute PE and no active pulmonary disease.  No evidence of pneumonia or pleural effusion.  His lab work reviewed by me shows no leukocytosis, no anemia.  No significant electrolyte abnormalities.  His lipase is elevated at 125 which does appear to be acute but he has no epigastric pain or complaint of nausea or vomiting.  I have a low suspicion of pancreatitis or gallstone pancreatitis.  I doubt acute intra-abdominal pathology in the absence of abdominal pain and with generally unconcerning labs.  Instructed the patient to follow-up with his PCP for reevaluation of this and repeat blood work.  His EKG does show a left bundle branch block which was not present on EKG in 2012 although it is unclear when this may have been present.  The patient tells me he does have a cardiologist with whom he follows up with regularly and had a stress test 2 years ago which was normal.  He will follow-up with his cardiologist for reevaluation of his abnormal EKG.  His troponin is negative and there is no evidence of ACS or MI.  Symptoms do not appear to be cardiac in etiology.  He has close follow-up scheduled with his general  surgeon this week for reevaluation.  On reevaluation the patient is resting comfortably no apparent distress.  He is tolerating p.o. food and fluids without difficulty.  Recommend follow-up with general surgeon, PCP, and cardiologist for reevaluation of his work-up today.  Discussed strict ED return precautions.  Patient and patient's significant other verbalized understanding of and agreement with plan and patient stable for discharge home at this time.  Discussed with Dr. Lynelle Doctor who agrees with assessment and plan at this time.  Final Clinical Impressions(s) / ED Diagnoses   Final diagnoses:  Pleuritic chest pain  Abnormal EKG  Elevated lipase    ED Discharge Orders    None       Bennye Alm 05/28/18 1716    Linwood Dibbles, MD 05/28/18 2342

## 2018-09-24 ENCOUNTER — Ambulatory Visit: Payer: Self-pay | Admitting: General Surgery

## 2018-09-24 NOTE — H&P (Signed)
History of Present Illness [name] MD; 09/24/2018 9:22 AM) The patient is a 67 year old male who presents with diverticulitis.  CHIEF COMPLAINT: post op Hartmann's for diverticular disease 05/17/18  Patient presents to the office today accompanied by his wife to discuss timing of colostomy closure. Patient states that his colostomy function has been good. His wound is now healed. He is anxious to proceed with colostomy closure. He has completed his colonoscopy for surveillance. One polyp was seen. He is ready to schedule surgery.   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 Resection of Small Bowel Shoulder Surgery Bilateral.  Diagnostic Studies History [name], MD; 09/24/2018 9:09 AM) Colonoscopy 1-5 years ago  Allergies Michel Bickers, LPN; 16/09/9603 8:55 AM) No Known Allergies [06/04/2018]:  Medication History [name], MD; 09/24/2018 9:09 AM) Rosuvastatin Calcium (40MG  Tablet, Oral) Active. Hydroxypropyl Methylcellulose (0.2% Solution, Ophthalmic) Active. Allopurinol (100MG  Tablet, 400 mg Oral) Active. Albuterol (90MCG/ACT Aerosol Soln, Inhalation) Active. Aspirin (81MG  Capsule, Oral) Active. Fenofibrate (160MG  Tablet, Oral) Active. Lisinopril (40MG  Tablet, Oral) Active. Medications Reconciled Neomycin Sulfate (500MG  Tablet, 2 (two) Oral SEE NOTE, Taken starting 09/24/2018) Active. (TAKE TWO TABLETS AT 2 PM, 3 PM, AND 10 PM THE DAY PRIOR TO SURGERY) Flagyl (500MG  Tablet, 2 (two) Oral SEE NOTE, Taken starting 09/24/2018) Active. (Take at 2pm, 3pm, and 10pm the day prior to your colon operation)  Social History [name], MD; 09/24/2018 9:09 AM) Caffeine use  Coffee. No drug use Tobacco use Never smoker.  Family History [name], MD; 09/24/2018 9:09 AM) Family history unknown First Degree Relatives  Other Problems [name], MD; 09/24/2018 9:09 AM) Asthma Umbilical Hernia Repair     Review of Systems [name] MD; 09/24/2018 9:09 AM) General Not Present- Appetite Loss, Chills, Fatigue, Fever, Night Sweats, Weight Gain and Weight Loss. Skin Not Present- Change in Wart/Mole, Dryness, Hives, Jaundice, New Lesions, Non-Healing Wounds, Rash and Ulcer. HEENT Not Present- Earache, Hearing Loss, Hoarseness, Nose Bleed, Oral Ulcers, Ringing in the Ears, Seasonal Allergies, Sinus Pain, Sore Throat, Visual Disturbances, Wears glasses/contact lenses and Yellow Eyes. Respiratory Not Present- Bloody sputum, Chronic Cough, Difficulty Breathing, Snoring and Wheezing. Breast Not Present- Breast Mass, Breast Pain, Nipple Discharge and Skin Changes. Cardiovascular Not Present- Chest Pain, Difficulty Breathing Lying Down, Leg Cramps, Palpitations, Rapid Heart Rate, Shortness of Breath and Swelling of Extremities. Gastrointestinal Present- Abdominal Pain. Not Present- Bloating, Bloody Stool, Change in Bowel Habits, Chronic diarrhea, Constipation, Difficulty Swallowing, Excessive gas, Gets full quickly at meals, Hemorrhoids, Indigestion, Nausea, Rectal Pain and Vomiting. Male Genitourinary Not Present- Blood in Urine, Change in Urinary Stream, Frequency, Impotence, Nocturia, Painful Urination, Urgency and Urine Leakage. Musculoskeletal Not Present- Back Pain, Joint Pain, Joint Stiffness, Muscle Pain, Muscle Weakness and Swelling of Extremities. Neurological Not Present- Decreased Memory, Fainting, Headaches, Numbness, Seizures, Tingling, Tremor, Trouble walking and Weakness. Psychiatric Not Present- Anxiety, Bipolar, Change in Sleep Pattern, Depression, Fearful and Frequent crying. Endocrine Not Present- Cold Intolerance, Excessive Hunger, Hair  Changes, Heat Intolerance and New Diabetes. Hematology Not Present- Blood Thinners, Easy Bruising, Excessive bleeding, Gland problems, HIV and Persistent Infections.  Vitals Tresa Endo Dockery LPN; 54/08/8118 8:55 AM) 09/24/2018 8:54 AM Weight: 190 lb Height: 65in Body Surface Area: 1.94 m Body Mass Index: 31.62 kg/m  Temp.: 97.55F(Temporal)  Pulse: 63 (Regular)  BP: 128/82 (Sitting, Right Arm, Standard)      Physical Exam [name] MD; 09/24/2018 9:10 AM)  General Mental Status-Alert. General Appearance-Not in acute distress. Build & Nutrition-Well nourished. Posture-Normal posture. Gait-Normal.  Head and Neck Head-normocephalic, atraumatic with no lesions or palpable masses. Trachea-midline.  Chest and Lung Exam Chest and lung exam reveals -on auscultation, normal breath sounds, no adventitious sounds and normal vocal resonance.  Cardiovascular Cardiovascular examination reveals -normal heart sounds, regular rate and rhythm with no murmurs and no digital clubbing, cyanosis, edema, increased warmth or tenderness.  Abdomen Inspection Inspection of the abdomen reveals - No Hernias. Skin - Colostomy - Left Lower Quadrant. Incisional scars - Laparotomy scar. Palpation/Percussion Palpation and Percussion of the abdomen reveal - Soft, Non Tender, No Rigidity (guarding), No hepatosplenomegaly and No Palpable abdominal masses.  Neurologic Neurologic evaluation reveals -alert and oriented x 3 with no impairment of recent or remote memory, normal attention span and ability to concentrate, normal sensation and normal coordination.  Musculoskeletal Normal Exam - Bilateral-Upper Extremity Strength Normal and Lower Extremity Strength Normal.    Assessment & Plan [name] MD; 09/24/2018 9:08 AM)  COLOSTOMY IN PLACE (Z93.3) Impression: 67 year old male status post Hartman's procedure in June 2019. He has completed his colonoscopy and is  ready to schedule colostomy reversal. I discussed this with him in detail. All questions were answered. The surgery and anatomy were described to the patient as well as the risks of surgery and the possible complications. These include: Bleeding, deep abdominal infections and possible wound complications such as hernia and infection, damage to adjacent structures, leak of surgical connections, which can lead to other surgeries and possibly an ostomy, possible need for other procedures, such as abscess drains in radiology, possible prolonged hospital stay, possible diarrhea from removal of part of the colon, possible constipation from narcotics, possible bowel, bladder or sexual dysfunction if having rectal surgery, prolonged fatigue/weakness or appetite loss, possible early recurrence of of disease, possible complications of their medical problems such as heart disease or arrhythmias or lung problems, death (less than 1%). I believe the patient understands and wishes to proceed with the surgery.

## 2018-10-18 ENCOUNTER — Encounter (HOSPITAL_COMMUNITY): Payer: Self-pay

## 2018-10-18 NOTE — Patient Instructions (Addendum)
[name]  [birth year]    Your procedure is scheduled on:  10-25-2018    [facility]  [facility], 6:45 Report to admitting at 0700    Call this number if you have problems the morning of surgery [phone]   Clear liquid diet on the day before surgery.       CLEAR LIQUID DIET   Foods Allowed                                                                     Foods Excluded  Coffee and tea, regular and decaf                             liquids that you cannot  Plain Jell-O in any flavor                                             see through such as: Fruit ices (not with fruit pulp)                                     milk, soups, orange juice  Iced Popsicles                                    All solid food Carbonated beverages, regular and diet                                    Cranberry, grape and apple juices Sports drinks like Gatorade Lightly seasoned clear broth or consume(fat free) Sugar, honey syrup  Sample Menu Breakfast                                Lunch                                     Supper Cranberry juice                    Beef broth                            Chicken broth Jell-O                                     Grape juice                           Apple juice Coffee or tea  Jell-O                                      Popsicle                                                Coffee or tea                        Coffee or tea  _____________________________________________________________________     Remember: DRINK 2 PRESURGERY ENSURE DRINKS THE NIGHT BEFORE SURGERY AT 1000 PM AND 1 PRESURGERY DRINK THE DAY OF THE PROCEDURE 3 HOURS PRIOR TO SCHEDULED SURGERY. NO   SOLIDS AFTER MIDNIGHT THE DAY PRIOR TO THE SURGERY. NOTHING BY MOUTH EXCEPT CLEAR LIQUIDS UNTIL THREE HOURS PRIOR TO SCHEDULED SURGERY. PLEASE FINISH PRESURGERY ENSURE DRINK   PER SURGEON ORDER 3 HOURS PRIOR TO SCHEDULED  SURGERY TIME WHICH NEEDS TO BE COMPLETED AT __0600am____.                                 BRUSH YOUR TEETH MORNING OF SURGERY AND RINSE YOUR MOUTH OUT, NO CHEWING GUM CANDY OR MINTS.     Take these medicines the morning of surgery with A SIP OF WATER:   Allopurinol,   Use Inhaler as usual and bring with you day of surgery.                             DO NOT TAKE ANY DIABETIC MEDICATIONS DAY OF YOUR SURGERY                                  You may not have any metal on your body including hair pins and piercings               Do not wear jewelry, make-up, lotions, powders or perfumes, deodorant                        Men may shave face and neck.      Do not bring valuables to the hospital.  IS NOT             RESPONSIBLE   FOR VALUABLES.  Contacts, dentures or bridgework may not be worn into surgery.  Leave suitcase in the car. After surgery it may be brought to your room.     SPECIAL INSTRUCTIONS:   BRING YOUR OSTOMY SUPPLIES WITH YOU DAY OF SURGERY      _____________________________________________________________________                                               _____________________________________________________________________             [facility] - Preparing for Surgery Before surgery, you can play an important role.  Because skin is not sterile, your skin needs to be as free of germs as possible.  You can reduce the number of germs on your  skin by washing with CHG (chlorahexidine gluconate) soap before surgery.  CHG is an antiseptic cleaner which kills germs and bonds with the skin to continue killing germs even after washing. Please DO NOT use if you have an allergy to CHG or antibacterial soaps.  If your skin becomes reddened/irritated stop using the CHG and inform your nurse when you arrive at Short Stay. Do not shave (including legs and underarms) for at least 48 hours prior to the first CHG shower.  You may shave your  face/neck. Please follow these instructions carefully:  1.  Shower with CHG Soap the night before surgery and the  morning of Surgery.  2.  If you choose to wash your hair, wash your hair first as usual with your  normal  shampoo.  3.  After you shampoo, rinse your hair and body thoroughly to remove the  shampoo.                            4.  Use CHG as you would any other liquid soap.  You can apply chg directly  to the skin and wash                       Gently with a scrungie or clean washcloth.  5.  Apply the CHG Soap to your body ONLY FROM THE NECK DOWN.   Do not use on face/ open                           Wound or open sores. Avoid contact with eyes, ears mouth and genitals (private parts).                       Wash face,  Genitals (private parts) with your normal soap.             6.  Wash thoroughly, paying special attention to the area where your surgery  will be performed.  7.  Thoroughly rinse your body with warm water from the neck down.  8.  DO NOT shower/wash with your normal soap after using and rinsing off  the CHG Soap.             9.  Pat yourself dry with a clean towel.            10.  Wear clean pajamas.            11.  Place clean sheets on your bed the night of your first shower and do not  sleep with pets. Day of Surgery : Do not apply any lotions/deodorants the morning of surgery.  Please wear clean clothes to the hospital/surgery center.  FAILURE TO FOLLOW THESE INSTRUCTIONS MAY RESULT IN THE CANCELLATION OF YOUR SURGERY PATIENT SIGNATURE_________________________________  NURSE SIGNATURE__________________________________  ________________________________________________________________________   [name]  An incentive spirometer is a tool that can help keep your lungs clear and active. This tool measures how well you are filling your lungs with each breath. Taking long deep breaths may help reverse or decrease the chance of developing breathing  (pulmonary) problems (especially infection) following:  A long period of time when you are unable to move or be active. BEFORE THE PROCEDURE   If the spirometer includes an indicator to show your best effort, your nurse or respiratory therapist will set it to a desired goal.  If possible, sit up  straight or lean slightly forward. Try not to slouch.  Hold the incentive spirometer in an upright position. INSTRUCTIONS FOR USE  1. Sit on the edge of your bed if possible, or sit up as far as you can in bed or on a chair. 2. Hold the incentive spirometer in an upright position. 3. Breathe out normally. 4. Place the mouthpiece in your mouth and seal your lips tightly around it. 5. Breathe in slowly and as deeply as possible, raising the piston or the ball toward the top of the column. 6. Hold your breath for 3-5 seconds or for as long as possible. Allow the piston or ball to fall to the bottom of the column. 7. Remove the mouthpiece from your mouth and breathe out normally. 8. Rest for a few seconds and repeat Steps 1 through 7 at least 10 times every 1-2 hours when you are awake. Take your time and take a few normal breaths between deep breaths. 9. The spirometer may include an indicator to show your best effort. Use the indicator as a goal to work toward during each repetition. 10. After each set of 10 deep breaths, practice coughing to be sure your lungs are clear. If you have an incision (the cut made at the time of surgery), support your incision when coughing by placing a pillow or rolled up towels firmly against it. Once you are able to get out of bed, walk around indoors and cough well. You may stop using the incentive spirometer when instructed by your caregiver.  RISKS AND COMPLICATIONS  Take your time so you do not get dizzy or light-headed.  If you are in pain, you may need to take or ask for pain medication before doing incentive spirometry. It is harder to take a deep breath if you  are having pain. AFTER USE  Rest and breathe slowly and easily.  It can be helpful to keep track of a log of your progress. Your caregiver can provide you with a simple table to help with this. If you are using the spirometer at home, follow these instructions: SEEK MEDICAL CARE IF:   You are having difficultly using the spirometer.  You have trouble using the spirometer as often as instructed.  Your pain medication is not giving enough relief while using the spirometer.  You develop fever of 100.5 F (38.1 C) or higher. SEEK IMMEDIATE MEDICAL CARE IF:   You cough up bloody sputum that had not been present before.  You develop fever of 102 F (38.9 C) or greater.  You develop worsening pain at or near the incision site. MAKE SURE YOU:   Understand these instructions.  Will watch your condition.  Will get help right away if you are not doing well or get worse. Document Released: 04/02/2007 Document Revised: 02/12/2012 Document Reviewed: 06/03/2007 [facility] Patient Information 2014 [place], [place].   ________________________________________________________________________

## 2018-10-20 ENCOUNTER — Encounter (HOSPITAL_BASED_OUTPATIENT_CLINIC_OR_DEPARTMENT_OTHER): Payer: Self-pay | Admitting: *Deleted

## 2018-10-20 ENCOUNTER — Other Ambulatory Visit: Payer: Self-pay

## 2018-10-20 ENCOUNTER — Emergency Department (HOSPITAL_BASED_OUTPATIENT_CLINIC_OR_DEPARTMENT_OTHER)
Admission: EM | Admit: 2018-10-20 | Discharge: 2018-10-20 | Disposition: A | Discharge status: Home / Self Care | Payer: Medicare Other | Attending: Emergency Medicine | Admitting: Emergency Medicine

## 2018-10-20 DIAGNOSIS — Z8719 Personal history of other diseases of the digestive system: Secondary | ICD-10-CM | POA: Insufficient documentation

## 2018-10-20 DIAGNOSIS — Z79899 Other long term (current) drug therapy: Secondary | ICD-10-CM | POA: Insufficient documentation

## 2018-10-20 DIAGNOSIS — I10 Essential (primary) hypertension: Secondary | ICD-10-CM | POA: Diagnosis not present

## 2018-10-20 DIAGNOSIS — S0591XA Unspecified injury of right eye and orbit, initial encounter: Secondary | ICD-10-CM | POA: Diagnosis present

## 2018-10-20 DIAGNOSIS — Y999 Unspecified external cause status: Secondary | ICD-10-CM | POA: Diagnosis not present

## 2018-10-20 DIAGNOSIS — Y929 Unspecified place or not applicable: Secondary | ICD-10-CM | POA: Insufficient documentation

## 2018-10-20 DIAGNOSIS — E119 Type 2 diabetes mellitus without complications: Secondary | ICD-10-CM | POA: Diagnosis not present

## 2018-10-20 DIAGNOSIS — S0501XA Injury of conjunctiva and corneal abrasion without foreign body, right eye, initial encounter: Secondary | ICD-10-CM | POA: Diagnosis not present

## 2018-10-20 DIAGNOSIS — Y93H2 Activity, gardening and landscaping: Secondary | ICD-10-CM | POA: Diagnosis not present

## 2018-10-20 DIAGNOSIS — J45909 Unspecified asthma, uncomplicated: Secondary | ICD-10-CM | POA: Insufficient documentation

## 2018-10-20 DIAGNOSIS — W228XXA Striking against or struck by other objects, initial encounter: Secondary | ICD-10-CM | POA: Insufficient documentation

## 2018-10-20 MED ORDER — TETRACAINE HCL 0.5 % OP SOLN
OPHTHALMIC | Status: AC
  Administered 2018-10-20: 1 [drp]
  Filled 2018-10-20: qty 4

## 2018-10-20 MED ORDER — ERYTHROMYCIN 5 MG/GM OP OINT
TOPICAL_OINTMENT | Freq: Four times a day (QID) | OPHTHALMIC | Status: DC
  Administered 2018-10-20: 1 via OPHTHALMIC
  Filled 2018-10-20: qty 3.5

## 2018-10-20 MED ORDER — PROPARACAINE HCL 0.5 % OP SOLN
OPHTHALMIC | Status: AC
  Filled 2018-10-20: qty 15

## 2018-10-20 MED ORDER — FLUORESCEIN SODIUM 1 MG OP STRP
1.0000 | ORAL_STRIP | Freq: Once | OPHTHALMIC | Status: AC
  Administered 2018-10-20: 1 via OPHTHALMIC

## 2018-10-20 NOTE — ED Provider Notes (Signed)
[facility] EMERGENCY DEPARTMENT Provider Note   CSN: [id number] Arrival date & time: 10/20/18  1831     History   Chief Complaint Chief Complaint  Patient presents with  . Eye Pain    HPI Xhaiden Coombs is a 67 y.o. male.  67 year old male with past medical history below who presents with right eye injury.  Earlier today, he was clearing brush when a branch hit him in the right eye.  He did not lose consciousness.  He has had irritation and tearing of his right eye.  He reports that his vision is normal.  His pain is mild.  No therapies tried prior to arrival.  The history is provided by the patient.  Eye Pain     Past Medical History:  Diagnosis Date  . Arthritis   . Asthma   . Back pain   . Diabetes mellitus   . Diverticular disease   . Environmental allergies   . History of diverticulitis of colon 05/17/2018   w/ perforation  s/p  sigmoid colectomy  . Hyperlipemia   . Hypertension   . Idiopathic chronic gout, multiple sites, without tophus (tophi)    rheumotologist-- dr Novella [facility])    Patient Active Problem List   Diagnosis Date Noted  . Pneumoperitoneum 05/17/2018  . Diverticulitis of colon with perforation s/p colectomy/ostomy 05/17/2018 05/17/2018  . S/P exploratory laparotomy 05/17/2018  . Diverticular disease   . Hypertension   . Hyperlipemia     Past Surgical History:  Procedure Laterality Date  . ACHILLES TENDON REPAIR Left 1974   ruptured  . COLON RESECTION N/A 05/17/2018   Procedure: EXPLORATORY LAPAROTOMY SIGMOID COLECTOMY AND  COLOSTOMY;  Surgeon: Darnell Level, MD;  Location: [facility] ORS;  Service: General;  Laterality: N/A;  . COLONOSCOPY    . COLOSTOMY    . KIDNEY STONE SURGERY    . KNEE ARTHROSCOPY Right   . SHOULDER ARTHROSCOPY W/ ROTATOR CUFF REPAIR Right 12-24-2010    dr sypher @[facility]   . SHOULDER ARTHROSCOPY W/ SUBACROMIAL DECOMPRESSION AND DISTAL CLAVICLE EXCISION Left 03-28-2012   dr sypher  @[facility]    debridement    . TRIGGER FINGER RELEASE  11/17/2011   Procedure: RELEASE TRIGGER FINGER/A-1 PULLEY;  Surgeon: Wyn Forster., MD;  Location:  SURGERY CENTER;  Service: Orthopedics;  Laterality: Left;  RELEASE LEFT LONG TRIGGER FINGER  . TUMOR REMOVAL     rt neck/chin        Home Medications    Prior to Admission medications   Medication Sig Start Date End Date Taking? Authorizing Provider  acetaminophen (TYLENOL) 500 MG tablet Take 2 tablets (1,000 mg total) by mouth every 8 (eight) hours as needed. Patient not taking: Reported on 10/18/2018 05/22/18   Adam Phenix, PA-C  albuterol (PROVENTIL HFA;VENTOLIN HFA) 108 (90 BASE) MCG/ACT inhaler Inhale 2 puffs into the lungs every 6 (six) hours as needed for wheezing.     [provider]  allopurinol (ZYLOPRIM) 100 MG tablet Take 100 mg by mouth daily.    [provider]  allopurinol (ZYLOPRIM) 300 MG tablet Take 300 mg by mouth daily.    [provider]  fenofibrate 160 MG tablet Take 160 mg by mouth daily.    [provider]  lisinopril (PRINIVIL,ZESTRIL) 40 MG tablet Take 20 mg by mouth daily.  05/01/18   [provider]  polyvinyl alcohol (ARTIFICIAL TEARS) 1.4 % ophthalmic solution Place 1 drop into both eyes daily as needed for dry eyes.  [provider]  rosuvastatin (CRESTOR) 40 MG tablet Take 20 mg by mouth at bedtime.     [provider]    Family History No family history on file.  Social History Social History   Tobacco Use  . Smoking status: Former Smoker    Last attempt to quit: 03/25/2010    Years since quitting: 8.5  . Smokeless tobacco: Never Used  Substance Use Topics  . Alcohol use: Yes    Comment: occ  . Drug use: No     Allergies   Hibiclens [chlorhexidine gluconate] and Flagyl [metronidazole]   Review of Systems Review of Systems  Constitutional: Negative for fever.  HENT: Negative for facial swelling.   Eyes: Positive for pain and  redness. Negative for photophobia, discharge and visual disturbance.  Skin: Negative for wound.     Physical Exam Updated Vital Signs BP (!) 146/79   Pulse (!) 54   Temp 98.3 F (36.8 C) (Oral)   Resp 18   SpO2 97%   Physical Exam  Constitutional: He is oriented to person, place, and time. He appears well-developed and well-nourished. No distress.  HENT:  Head: Normocephalic and atraumatic.  Eyes: Pupils are equal, round, and reactive to light. EOM and lids are normal. Right eye exhibits no chemosis and no discharge. Right conjunctiva is injected.    Abnormal fluorescein uptake on lateral R eye just lateral to iris; punctate fluorescein uptake at superior border of iris; tearing of R eye, no foreign body  Neck: Neck supple.  Neurological: He is alert and oriented to person, place, and time.  Skin: Skin is warm and dry.  Psychiatric: He has a normal mood and affect. Judgment normal.  Nursing note and vitals reviewed.    ED Treatments / Results  Labs (all labs ordered are listed, but only abnormal results are displayed) Labs Reviewed - No data to display  EKG None  Radiology No results found.  Procedures Procedures (including critical care time)  Medications Ordered in ED Medications  tetracaine (PONTOCAINE) 0.5 % ophthalmic solution (1 drop  Given 10/20/18 1853)  fluorescein ophthalmic strip 1 strip (1 strip Right Eye Given 10/20/18 2031)     Visual Acuity  Right Eye Distance: 20/40 Left Eye Distance: 20/20 Bilateral Distance: 20/25  Right Eye Near:   Left Eye Near:    Bilateral Near:      Initial Impression / Assessment and Plan / ED Course  I have reviewed the triage vital signs and the nursing notes.      Exam c/w corneal abrasion. Visual acuity screening reassuring. No features suggestive of globe rupture. Gave erythromycin ointment and directed to see his eye doctor this week for reassessment to ensure that it is healing appropriately.  Reviewed  return precautions regarding signs of infection or major changes in vision.  He voiced understanding.  Final Clinical Impressions(s) / ED Diagnoses   Final diagnoses:  Abrasion of right cornea, initial encounter    ED Discharge Orders    None       Laryah Neuser, Ambrose Finlandachel Morgan, MD 10/21/18 0022

## 2018-10-20 NOTE — Discharge Instructions (Signed)
Follow up with eye doctor in ~3 days. Use eye ointment 4 times a day and use artificial tears as needed. Return to ER if any loss of vision, pus-like drainage, or swelling.

## 2018-10-20 NOTE — ED Triage Notes (Signed)
States he was cleaning brush and a branch hit him in the right eye

## 2018-10-20 NOTE — ED Notes (Signed)
ED Provider at bedside. 

## 2018-10-20 NOTE — ED Notes (Signed)
Pt and family member understood dc material. NAD noted. Ointment given at dc

## 2018-10-22 ENCOUNTER — Encounter (HOSPITAL_COMMUNITY)
Admission: RE | Admit: 2018-10-22 | Discharge: 2018-10-22 | Disposition: A | Discharge status: Home / Self Care | Payer: Medicare Other | Source: Ambulatory Visit | Attending: General Surgery | Admitting: General Surgery

## 2018-10-22 ENCOUNTER — Encounter (HOSPITAL_COMMUNITY): Payer: Self-pay

## 2018-10-22 ENCOUNTER — Other Ambulatory Visit: Payer: Self-pay

## 2018-10-22 DIAGNOSIS — R9431 Abnormal electrocardiogram [ECG] [EKG]: Secondary | ICD-10-CM | POA: Insufficient documentation

## 2018-10-22 DIAGNOSIS — I447 Left bundle-branch block, unspecified: Secondary | ICD-10-CM | POA: Insufficient documentation

## 2018-10-22 DIAGNOSIS — Z01818 Encounter for other preprocedural examination: Secondary | ICD-10-CM

## 2018-10-22 HISTORY — DX: Personal history of urinary calculi: Z87.442

## 2018-10-22 HISTORY — DX: Personal history of other diseases of the digestive system: Z87.19

## 2018-10-22 LAB — HEMOGLOBIN A1C
Hgb A1c MFr Bld: 6.4 % — ABNORMAL HIGH (ref 4.8–5.6)
MEAN PLASMA GLUCOSE: 136.98 mg/dL

## 2018-10-22 LAB — CBC
HCT: 45 % (ref 39.0–52.0)
HEMOGLOBIN: 14.8 g/dL (ref 13.0–17.0)
MCH: 31.8 pg (ref 26.0–34.0)
MCHC: 32.9 g/dL (ref 30.0–36.0)
MCV: 96.6 fL (ref 80.0–100.0)
Platelets: 374 10*3/uL (ref 150–400)
RBC: 4.66 MIL/uL (ref 4.22–5.81)
RDW: 12.4 % (ref 11.5–15.5)
WBC: 8 10*3/uL (ref 4.0–10.5)
nRBC: 0 % (ref 0.0–0.2)

## 2018-10-22 LAB — BASIC METABOLIC PANEL
ANION GAP: 10 (ref 5–15)
BUN: 35 mg/dL — ABNORMAL HIGH (ref 8–23)
CO2: 25 mmol/L (ref 22–32)
Calcium: 9.8 mg/dL (ref 8.9–10.3)
Chloride: 102 mmol/L (ref 98–111)
Creatinine, Ser: 1.25 mg/dL — ABNORMAL HIGH (ref 0.61–1.24)
GFR, EST NON AFRICAN AMERICAN: 59 mL/min — AB (ref >60)
Glucose, Bld: 148 mg/dL — ABNORMAL HIGH (ref 70–99)
POTASSIUM: 4.5 mmol/L (ref 3.5–5.1)
SODIUM: 137 mmol/L (ref 135–145)

## 2018-10-22 LAB — GLUCOSE, CAPILLARY: GLUCOSE-CAPILLARY: 154 mg/dL — AB (ref 70–99)

## 2018-10-22 NOTE — Progress Notes (Signed)
Patient in today for preop prior to surgery on 10/25/2018.  EKg from 05/27/2018 shows new LBBB.  Patient denies any chest pain or shortness of breath.  EKG from 2012 is Sinus Rhythm.  EKg today shows LBBB.  Per ED note from 05/27/2018 for pleuritic chest pain visit patient was supposed to follow up with cardiologist.  Patient reports at preop appt that he has not seen any doctors since 05/2018 surgery except for Dr Maisie Fushomas.  Anesthesia ( Dr Desmond Lopeurk ) made aware of above.  Patient needs cardiac clearance prior to surgery per Dr Desmond Lopeurk.  Called office of [facility] and spoke with Toniann FailWendy in Triage and made her aware.  She stated she would inform Dr Maisie Fushomas.

## 2018-10-22 NOTE — Progress Notes (Signed)
BMP done 10/22/18 faxed via epic to Dr Maisie Fushomas.,

## 2018-10-24 MED ORDER — BUPIVACAINE LIPOSOME 1.3 % IJ SUSP
20.0000 mL | INTRAMUSCULAR | Status: DC
  Filled 2018-10-24: qty 20

## 2018-10-24 NOTE — Progress Notes (Signed)
Final EKG done 10/22/2018-epic.

## 2018-10-25 ENCOUNTER — Other Ambulatory Visit: Payer: Self-pay

## 2018-10-25 ENCOUNTER — Encounter (HOSPITAL_COMMUNITY): Payer: Self-pay

## 2018-10-25 ENCOUNTER — Inpatient Hospital Stay (HOSPITAL_COMMUNITY): Payer: Medicare Other | Admitting: Anesthesiology

## 2018-10-25 ENCOUNTER — Encounter (HOSPITAL_COMMUNITY)
Admission: RE | Disposition: A | Discharge status: Home / Self Care | Payer: Self-pay | Source: Ambulatory Visit | Attending: General Surgery

## 2018-10-25 ENCOUNTER — Inpatient Hospital Stay (HOSPITAL_COMMUNITY)
Admission: RE | Admit: 2018-10-25 | Discharge: 2018-10-27 | DRG: 331 | Disposition: A | Discharge status: Home / Self Care | Payer: Medicare Other | Source: Ambulatory Visit | Attending: General Surgery | Admitting: General Surgery

## 2018-10-25 DIAGNOSIS — K5790 Diverticulosis of intestine, part unspecified, without perforation or abscess without bleeding: Secondary | ICD-10-CM | POA: Diagnosis present

## 2018-10-25 DIAGNOSIS — I10 Essential (primary) hypertension: Secondary | ICD-10-CM | POA: Diagnosis not present

## 2018-10-25 DIAGNOSIS — Z433 Encounter for attention to colostomy: Principal | ICD-10-CM

## 2018-10-25 DIAGNOSIS — Z7982 Long term (current) use of aspirin: Secondary | ICD-10-CM | POA: Diagnosis not present

## 2018-10-25 HISTORY — PX: COLOSTOMY TAKEDOWN: SHX5258

## 2018-10-25 LAB — TYPE AND SCREEN
ABO/RH(D): O POS
Antibody Screen: NEGATIVE

## 2018-10-25 LAB — GLUCOSE, CAPILLARY
GLUCOSE-CAPILLARY: 139 mg/dL — AB (ref 70–99)
Glucose-Capillary: 98 mg/dL (ref 70–99)

## 2018-10-25 SURGERY — CLOSURE, COLOSTOMY, LAPAROSCOPIC
Anesthesia: General | Site: Abdomen

## 2018-10-25 MED ORDER — GABAPENTIN 300 MG PO CAPS
300.0000 mg | ORAL_CAPSULE | ORAL | Status: AC
  Administered 2018-10-25: 300 mg via ORAL
  Filled 2018-10-25: qty 1

## 2018-10-25 MED ORDER — PROPOFOL 10 MG/ML IV BOLUS
INTRAVENOUS | Status: DC | PRN
  Administered 2018-10-25: 150 mg via INTRAVENOUS

## 2018-10-25 MED ORDER — HYDROMORPHONE HCL 1 MG/ML IJ SOLN
0.5000 mg | INTRAMUSCULAR | Status: DC | PRN
  Administered 2018-10-25 – 2018-10-27 (×9): 0.5 mg via INTRAVENOUS
  Filled 2018-10-25 (×9): qty 0.5

## 2018-10-25 MED ORDER — SACCHAROMYCES BOULARDII 250 MG PO CAPS
250.0000 mg | ORAL_CAPSULE | Freq: Two times a day (BID) | ORAL | Status: DC
  Administered 2018-10-25 – 2018-10-27 (×4): 250 mg via ORAL
  Filled 2018-10-25 (×4): qty 1

## 2018-10-25 MED ORDER — LIDOCAINE HCL (CARDIAC) PF 100 MG/5ML IV SOSY
PREFILLED_SYRINGE | INTRAVENOUS | Status: DC | PRN
  Administered 2018-10-25: 30 mg via INTRAVENOUS

## 2018-10-25 MED ORDER — BUPIVACAINE-EPINEPHRINE (PF) 0.25% -1:200000 IJ SOLN
INTRAMUSCULAR | Status: AC
  Filled 2018-10-25: qty 30

## 2018-10-25 MED ORDER — LACTATED RINGERS IR SOLN
Status: DC | PRN
  Administered 2018-10-25: 1000 mL

## 2018-10-25 MED ORDER — ONDANSETRON HCL 4 MG PO TABS: 4.0000 mg | ORAL_TABLET | Freq: Four times a day (QID) | ORAL | Status: DC | PRN

## 2018-10-25 MED ORDER — GABAPENTIN 300 MG PO CAPS
300.0000 mg | ORAL_CAPSULE | Freq: Two times a day (BID) | ORAL | Status: DC
  Administered 2018-10-25 – 2018-10-27 (×4): 300 mg via ORAL
  Filled 2018-10-25 (×4): qty 1

## 2018-10-25 MED ORDER — PROPOFOL 10 MG/ML IV BOLUS
INTRAVENOUS | Status: AC
  Filled 2018-10-25: qty 20

## 2018-10-25 MED ORDER — DEXAMETHASONE SODIUM PHOSPHATE 10 MG/ML IJ SOLN
INTRAMUSCULAR | Status: AC
  Filled 2018-10-25: qty 1

## 2018-10-25 MED ORDER — TRAMADOL HCL 50 MG PO TABS
50.0000 mg | ORAL_TABLET | Freq: Four times a day (QID) | ORAL | Status: DC | PRN
  Administered 2018-10-26 – 2018-10-27 (×5): 50 mg via ORAL
  Filled 2018-10-25 (×5): qty 1

## 2018-10-25 MED ORDER — SUGAMMADEX SODIUM 200 MG/2ML IV SOLN
INTRAVENOUS | Status: AC
  Filled 2018-10-25: qty 2

## 2018-10-25 MED ORDER — EPHEDRINE SULFATE 50 MG/ML IJ SOLN
INTRAMUSCULAR | Status: DC | PRN
  Administered 2018-10-25: 10 mg via INTRAVENOUS
  Administered 2018-10-25: 5 mg via INTRAVENOUS
  Administered 2018-10-25: 10 mg via INTRAVENOUS

## 2018-10-25 MED ORDER — FENTANYL CITRATE (PF) 100 MCG/2ML IJ SOLN
INTRAMUSCULAR | Status: DC | PRN
  Administered 2018-10-25 (×3): 50 ug via INTRAVENOUS
  Administered 2018-10-25: 100 ug via INTRAVENOUS
  Administered 2018-10-25: 50 ug via INTRAVENOUS

## 2018-10-25 MED ORDER — SODIUM CHLORIDE 0.9 % IV SOLN
2.0000 g | INTRAVENOUS | Status: AC
  Administered 2018-10-25: 2 g via INTRAVENOUS
  Filled 2018-10-25: qty 2

## 2018-10-25 MED ORDER — ACETAMINOPHEN 500 MG PO TABS
1000.0000 mg | ORAL_TABLET | Freq: Four times a day (QID) | ORAL | Status: DC
  Administered 2018-10-25 – 2018-10-27 (×8): 1000 mg via ORAL
  Filled 2018-10-25 (×8): qty 2

## 2018-10-25 MED ORDER — ENOXAPARIN SODIUM 40 MG/0.4ML ~~LOC~~ SOLN
40.0000 mg | SUBCUTANEOUS | Status: DC
  Administered 2018-10-26 – 2018-10-27 (×2): 40 mg via SUBCUTANEOUS
  Filled 2018-10-25 (×2): qty 0.4

## 2018-10-25 MED ORDER — ALUM & MAG HYDROXIDE-SIMETH 200-200-20 MG/5ML PO SUSP
30.0000 mL | Freq: Four times a day (QID) | ORAL | Status: DC | PRN
  Administered 2018-10-27: 30 mL via ORAL
  Filled 2018-10-25: qty 30

## 2018-10-25 MED ORDER — ALVIMOPAN 12 MG PO CAPS
12.0000 mg | ORAL_CAPSULE | Freq: Two times a day (BID) | ORAL | Status: DC
  Administered 2018-10-26: 12 mg via ORAL
  Filled 2018-10-25: qty 1

## 2018-10-25 MED ORDER — KETAMINE HCL 10 MG/ML IJ SOLN
INTRAMUSCULAR | Status: AC
  Filled 2018-10-25: qty 1

## 2018-10-25 MED ORDER — ACETAMINOPHEN 500 MG PO TABS
1000.0000 mg | ORAL_TABLET | ORAL | Status: AC
  Administered 2018-10-25: 1000 mg via ORAL
  Filled 2018-10-25: qty 2

## 2018-10-25 MED ORDER — BUPIVACAINE LIPOSOME 1.3 % IJ SUSP
INTRAMUSCULAR | Status: DC | PRN
  Administered 2018-10-25: 20 mL

## 2018-10-25 MED ORDER — LACTATED RINGERS IV SOLN
INTRAVENOUS | Status: DC
  Administered 2018-10-25: 10:00:00 via INTRAVENOUS
  Administered 2018-10-25: 1000 mL via INTRAVENOUS

## 2018-10-25 MED ORDER — LIP MEDEX EX OINT
TOPICAL_OINTMENT | CUTANEOUS | Status: AC
  Administered 2018-10-25: 1
  Filled 2018-10-25: qty 7

## 2018-10-25 MED ORDER — LIDOCAINE 2% (20 MG/ML) 5 ML SYRINGE
INTRAMUSCULAR | Status: DC | PRN
  Administered 2018-10-25: 1.5 mg/kg/h via INTRAVENOUS

## 2018-10-25 MED ORDER — LIDOCAINE HCL 2 % IJ SOLN
INTRAMUSCULAR | Status: AC
  Filled 2018-10-25: qty 20

## 2018-10-25 MED ORDER — SODIUM CHLORIDE 0.9 % IR SOLN
Status: DC | PRN
  Administered 2018-10-25: 2000 mL

## 2018-10-25 MED ORDER — ONDANSETRON HCL 4 MG/2ML IJ SOLN: 4.0000 mg | Freq: Once | INTRAMUSCULAR | Status: DC | PRN

## 2018-10-25 MED ORDER — ONDANSETRON HCL 4 MG/2ML IJ SOLN
INTRAMUSCULAR | Status: DC | PRN
  Administered 2018-10-25: 4 mg via INTRAVENOUS

## 2018-10-25 MED ORDER — ALVIMOPAN 12 MG PO CAPS
12.0000 mg | ORAL_CAPSULE | ORAL | Status: AC
  Administered 2018-10-25: 12 mg via ORAL
  Filled 2018-10-25: qty 1

## 2018-10-25 MED ORDER — DEXAMETHASONE SODIUM PHOSPHATE 10 MG/ML IJ SOLN
INTRAMUSCULAR | Status: DC | PRN
  Administered 2018-10-25: 10 mg via INTRAVENOUS

## 2018-10-25 MED ORDER — LISINOPRIL 20 MG PO TABS
20.0000 mg | ORAL_TABLET | Freq: Every day | ORAL | Status: DC
  Administered 2018-10-26 – 2018-10-27 (×2): 20 mg via ORAL
  Filled 2018-10-25 (×2): qty 1

## 2018-10-25 MED ORDER — SODIUM CHLORIDE 0.9 % IV SOLN
2.0000 g | Freq: Two times a day (BID) | INTRAVENOUS | Status: AC
  Administered 2018-10-25: 2 g via INTRAVENOUS
  Filled 2018-10-25: qty 2

## 2018-10-25 MED ORDER — ROCURONIUM BROMIDE 100 MG/10ML IV SOLN
INTRAVENOUS | Status: AC
  Filled 2018-10-25: qty 1

## 2018-10-25 MED ORDER — FENTANYL CITRATE (PF) 250 MCG/5ML IJ SOLN
INTRAMUSCULAR | Status: AC
  Filled 2018-10-25: qty 5

## 2018-10-25 MED ORDER — MIDAZOLAM HCL 5 MG/5ML IJ SOLN
INTRAMUSCULAR | Status: DC | PRN
  Administered 2018-10-25: 2 mg via INTRAVENOUS

## 2018-10-25 MED ORDER — ONDANSETRON HCL 4 MG/2ML IJ SOLN
INTRAMUSCULAR | Status: AC
  Filled 2018-10-25: qty 2

## 2018-10-25 MED ORDER — ENSURE SURGERY PO LIQD
237.0000 mL | Freq: Two times a day (BID) | ORAL | Status: DC
  Administered 2018-10-26 – 2018-10-27 (×3): 237 mL via ORAL
  Filled 2018-10-25 (×5): qty 237

## 2018-10-25 MED ORDER — ALBUTEROL SULFATE (2.5 MG/3ML) 0.083% IN NEBU: 2.5000 mg | INHALATION_SOLUTION | Freq: Four times a day (QID) | RESPIRATORY_TRACT | Status: DC | PRN

## 2018-10-25 MED ORDER — FENTANYL CITRATE (PF) 100 MCG/2ML IJ SOLN: 25.0000 ug | INTRAMUSCULAR | Status: DC | PRN

## 2018-10-25 MED ORDER — SUGAMMADEX SODIUM 200 MG/2ML IV SOLN
INTRAVENOUS | Status: DC | PRN
  Administered 2018-10-25: 200 mg via INTRAVENOUS

## 2018-10-25 MED ORDER — KETAMINE HCL 10 MG/ML IJ SOLN
INTRAMUSCULAR | Status: DC | PRN
  Administered 2018-10-25: 20 mg via INTRAVENOUS

## 2018-10-25 MED ORDER — KCL IN DEXTROSE-NACL 20-5-0.45 MEQ/L-%-% IV SOLN
INTRAVENOUS | Status: DC
  Administered 2018-10-25: 17:00:00 via INTRAVENOUS
  Filled 2018-10-25: qty 1000

## 2018-10-25 MED ORDER — MIDAZOLAM HCL 2 MG/2ML IJ SOLN
INTRAMUSCULAR | Status: AC
  Filled 2018-10-25: qty 2

## 2018-10-25 MED ORDER — BUPIVACAINE-EPINEPHRINE (PF) 0.25% -1:200000 IJ SOLN
INTRAMUSCULAR | Status: DC | PRN
  Administered 2018-10-25: 30 mL

## 2018-10-25 MED ORDER — ONDANSETRON HCL 4 MG/2ML IJ SOLN: 4.0000 mg | Freq: Four times a day (QID) | INTRAMUSCULAR | Status: DC | PRN

## 2018-10-25 MED ORDER — FENTANYL CITRATE (PF) 100 MCG/2ML IJ SOLN
INTRAMUSCULAR | Status: AC
  Filled 2018-10-25: qty 2

## 2018-10-25 MED ORDER — ALLOPURINOL 300 MG PO TABS
300.0000 mg | ORAL_TABLET | Freq: Every day | ORAL | Status: DC
  Administered 2018-10-26 – 2018-10-27 (×2): 300 mg via ORAL
  Filled 2018-10-25 (×2): qty 1

## 2018-10-25 MED ORDER — ROCURONIUM BROMIDE 100 MG/10ML IV SOLN
INTRAVENOUS | Status: DC | PRN
  Administered 2018-10-25: 10 mg via INTRAVENOUS
  Administered 2018-10-25: 50 mg via INTRAVENOUS
  Administered 2018-10-25: 10 mg via INTRAVENOUS

## 2018-10-25 MED ORDER — SODIUM CHLORIDE (PF) 0.9 % IJ SOLN
INTRAMUSCULAR | Status: AC
  Filled 2018-10-25: qty 50

## 2018-10-25 SURGICAL SUPPLY — 65 items
APPLIER CLIP 5 13 M/L LIGAMAX5 (MISCELLANEOUS)
BLADE EXTENDED COATED 6.5IN (ELECTRODE) IMPLANT
CABLE HIGH FREQUENCY MONO STRZ (ELECTRODE) IMPLANT
CELLS DAT CNTRL 66122 CELL SVR (MISCELLANEOUS) IMPLANT
CLIP APPLIE 5 13 M/L LIGAMAX5 (MISCELLANEOUS) IMPLANT
COVER WAND RF STERILE (DRAPES) ×3 IMPLANT
DECANTER SPIKE VIAL GLASS SM (MISCELLANEOUS) ×3 IMPLANT
DERMABOND ADVANCED (GAUZE/BANDAGES/DRESSINGS) ×2
DERMABOND ADVANCED .7 DNX12 (GAUZE/BANDAGES/DRESSINGS) ×1 IMPLANT
DRAIN CHANNEL 19F RND (DRAIN) IMPLANT
DRAPE LAPAROSCOPIC ABDOMINAL (DRAPES) IMPLANT
DRAPE SURG IRRIG POUCH 19X23 (DRAPES) ×3 IMPLANT
DRSG OPSITE POSTOP 4X10 (GAUZE/BANDAGES/DRESSINGS) IMPLANT
DRSG OPSITE POSTOP 4X6 (GAUZE/BANDAGES/DRESSINGS) IMPLANT
DRSG OPSITE POSTOP 4X8 (GAUZE/BANDAGES/DRESSINGS) ×3 IMPLANT
ELECT PENCIL ROCKER SW 15FT (MISCELLANEOUS) ×6 IMPLANT
ELECT REM PT RETURN 15FT ADLT (MISCELLANEOUS) ×3 IMPLANT
EVACUATOR SILICONE 100CC (DRAIN) IMPLANT
GAUZE SPONGE 4X4 12PLY STRL (GAUZE/BANDAGES/DRESSINGS) ×3 IMPLANT
GLOVE BIO SURGEON STRL SZ 6.5 (GLOVE) ×4 IMPLANT
GLOVE BIO SURGEONS STRL SZ 6.5 (GLOVE) ×2
GLOVE BIOGEL PI IND STRL 7.0 (GLOVE) ×2 IMPLANT
GLOVE BIOGEL PI INDICATOR 7.0 (GLOVE) ×4
GOWN STRL REUS W/TWL 2XL LVL3 (GOWN DISPOSABLE) ×6 IMPLANT
GOWN STRL REUS W/TWL XL LVL3 (GOWN DISPOSABLE) ×12 IMPLANT
GRASPER ENDOPATH ANVIL 10MM (MISCELLANEOUS) IMPLANT
HOLDER FOLEY CATH W/STRAP (MISCELLANEOUS) ×3 IMPLANT
IRRIG SUCT STRYKERFLOW 2 WTIP (MISCELLANEOUS) ×3
IRRIGATION SUCT STRKRFLW 2 WTP (MISCELLANEOUS) ×1 IMPLANT
PACK COLON (CUSTOM PROCEDURE TRAY) ×3 IMPLANT
PAD POSITIONING PINK XL (MISCELLANEOUS) ×3 IMPLANT
PORT LAP GEL ALEXIS MED 5-9CM (MISCELLANEOUS) ×3 IMPLANT
PROTECTOR NERVE ULNAR (MISCELLANEOUS) ×3 IMPLANT
RTRCTR WOUND ALEXIS 18CM MED (MISCELLANEOUS)
SCISSORS LAP 5X35 DISP (ENDOMECHANICALS) ×3 IMPLANT
SEALER TISSUE G2 STRG ARTC 35C (ENDOMECHANICALS) ×3 IMPLANT
SLEEVE XCEL OPT CAN 5 100 (ENDOMECHANICALS) ×3 IMPLANT
SPONGE DRAIN TRACH 4X4 STRL 2S (GAUZE/BANDAGES/DRESSINGS) IMPLANT
SPONGE LAP 18X18 RF (DISPOSABLE) IMPLANT
STAPLER ECHELON POWER CIR 31 (STAPLE) ×3 IMPLANT
STAPLER VISISTAT 35W (STAPLE) ×3 IMPLANT
SURGILUBE 2OZ TUBE FLIPTOP (MISCELLANEOUS) ×3 IMPLANT
SUT ETHILON 2 0 PS N (SUTURE) IMPLANT
SUT NOVA NAB GS-21 1 T12 (SUTURE) ×6 IMPLANT
SUT PDS AB 1 CTX 36 (SUTURE) IMPLANT
SUT PDS AB 1 TP1 96 (SUTURE) IMPLANT
SUT PROLENE 2 0 KS (SUTURE) ×3 IMPLANT
SUT SILK 2 0 (SUTURE) ×2
SUT SILK 2 0 SH CR/8 (SUTURE) ×3 IMPLANT
SUT SILK 2-0 18XBRD TIE 12 (SUTURE) ×1 IMPLANT
SUT SILK 3 0 (SUTURE) ×2
SUT SILK 3 0 SH CR/8 (SUTURE) ×3 IMPLANT
SUT SILK 3-0 18XBRD TIE 12 (SUTURE) ×1 IMPLANT
SUT VIC AB 2-0 SH 18 (SUTURE) ×3 IMPLANT
SUT VIC AB 4-0 PS2 27 (SUTURE) ×3 IMPLANT
SYS LAPSCP GELPORT 120MM (MISCELLANEOUS)
SYSTEM LAPSCP GELPORT 120MM (MISCELLANEOUS) IMPLANT
TAPE CLOTH SURG 4X10 WHT LF (GAUZE/BANDAGES/DRESSINGS) ×3 IMPLANT
TOWEL OR NON WOVEN STRL DISP B (DISPOSABLE) ×3 IMPLANT
TRAY FOLEY MTR SLVR 16FR STAT (SET/KITS/TRAYS/PACK) ×3 IMPLANT
TROCAR BLADELESS OPT 5 100 (ENDOMECHANICALS) ×3 IMPLANT
TROCAR XCEL BLUNT TIP 100MML (ENDOMECHANICALS) IMPLANT
TUBING CONNECTING 10 (TUBING) ×4 IMPLANT
TUBING CONNECTING 10' (TUBING) ×2
TUBING INSUF HEATED (TUBING) ×3 IMPLANT

## 2018-10-25 NOTE — Progress Notes (Signed)
[facility] education reinforced. Did patient attend class prior to procedure? Yes [ x  ] No [   ] Discussed: Pain Control [ x  ] Mobility [ x  ] Diet [ x  ] Other [   ]  Sitting in chair. Has already walked in the hallways. Working on IS. Will follow. Dorena BodoKaren Shimeka Bacot, RN, BSN Quality Program Coordinator, Enhanced Recovery after Surgery 10/25/18 4:07 PM

## 2018-10-25 NOTE — H&P (Signed)
The patient is a 67 year old male who presents with diverticulitis.  CHIEF COMPLAINT: post op Hartmann's for diverticular disease 05/17/18  Patient presents to the office today accompanied by his wife to discuss timing of colostomy closure. Patient states that his colostomy function has been good. His wound is now healed. He is anxious to proceed with colostomy closure. He has completed his colonoscopy for surveillance. One polyp was seen. He is ready to schedule surgery.   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 Resection of Small Bowel Shoulder Surgery Bilateral.  Diagnostic Studies History [name], MD; 09/24/2018 9:09 AM) Colonoscopy 1-5 years ago  Allergies Michel Bickers, LPN; 32/44/0102 8:55 AM) No Known Allergies [06/04/2018]:  Medication History [name], MD; 09/24/2018 9:09 AM) Rosuvastatin Calcium (40MG  Tablet, Oral) Active. Hydroxypropyl Methylcellulose (0.2% Solution, Ophthalmic) Active. Allopurinol (100MG  Tablet, 400 mg Oral) Active. Albuterol (90MCG/ACT Aerosol Soln, Inhalation) Active. Aspirin (81MG  Capsule, Oral) Active. Fenofibrate (160MG  Tablet, Oral) Active. Lisinopril (40MG  Tablet, Oral) Active. Medications Reconciled Neomycin Sulfate (500MG  Tablet, 2 (two) Oral SEE NOTE, Taken starting 09/24/2018) Active. (TAKE TWO TABLETS AT 2 PM, 3 PM, AND 10 PM THE DAY PRIOR TO SURGERY) Flagyl (500MG  Tablet, 2 (two) Oral SEE NOTE, Taken starting 09/24/2018) Active. (Take at 2pm, 3pm, and 10pm the day prior to your colon operation)  Social History [name], MD; 09/24/2018 9:09 AM) Caffeine use Coffee. No drug use Tobacco use Never smoker.  Family  History [name], MD; 09/24/2018 9:09 AM) Family history unknown First Degree Relatives  Other Problems [name], MD; 09/24/2018 9:09 AM) Asthma Umbilical Hernia Repair     Review of Systems General Not Present- Appetite Loss, Chills, Fatigue, Fever, Night Sweats, Weight Gain and Weight Loss. Skin Not Present- Change in Wart/Mole, Dryness, Hives, Jaundice, New Lesions, Non-Healing Wounds, Rash and Ulcer. HEENT Not Present- Earache, Hearing Loss, Hoarseness, Nose Bleed, Oral Ulcers, Ringing in the Ears, Seasonal Allergies, Sinus Pain, Sore Throat, Visual Disturbances, Wears glasses/contact lenses and Yellow Eyes. Respiratory Not Present- Bloody sputum, Chronic Cough, Difficulty Breathing, Snoring and Wheezing. Breast Not Present- Breast Mass, Breast Pain, Nipple Discharge and Skin Changes. Cardiovascular Not Present- Chest Pain, Difficulty Breathing Lying Down, Leg Cramps, Palpitations, Rapid Heart Rate, Shortness of Breath and Swelling of Extremities. Gastrointestinal Present- Abdominal Pain. Not Present- Bloating, Bloody Stool, Change in Bowel Habits, Chronic diarrhea, Constipation, Difficulty Swallowing, Excessive gas, Gets full quickly at meals, Hemorrhoids, Indigestion, Nausea, Rectal Pain and Vomiting. Male Genitourinary Not Present- Blood in Urine, Change in Urinary Stream, Frequency, Impotence, Nocturia, Painful Urination, Urgency and Urine Leakage. Musculoskeletal Not Present- Back Pain, Joint Pain, Joint Stiffness, Muscle Pain, Muscle Weakness and Swelling of Extremities. Neurological Not Present- Decreased Memory, Fainting, Headaches, Numbness, Seizures, Tingling, Tremor, Trouble walking and Weakness. Psychiatric Not Present- Anxiety, Bipolar, Change in Sleep Pattern, Depression, Fearful and Frequent crying. Endocrine Not Present- Cold Intolerance, Excessive Hunger, Hair Changes, Heat Intolerance and New Diabetes. Hematology Not Present- Blood Thinners, Easy  Bruising, Excessive bleeding, Gland problems, HIV and Persistent Infections.  BP 122/75   Pulse (!) 57   Temp (!) 97.3 F (36.3 C) (Oral)   Resp 16   SpO2 100%    Physical Exam   General Mental Status-Alert. General Appearance-Not in acute distress. Build & Nutrition-Well nourished. Posture-Normal posture. Gait-Normal.  Head and Neck  Head-normocephalic, atraumatic with no lesions or palpable masses. Trachea-midline.  Chest and Lung Exam Chest and lung exam reveals -on auscultation, normal breath sounds, no adventitious sounds and normal vocal resonance.  Cardiovascular Cardiovascular examination reveals -normal heart sounds, regular rate and rhythm with no murmurs and no digital clubbing, cyanosis, edema, increased warmth or tenderness.  Abdomen Inspection Inspection of the abdomen reveals - No Hernias. Skin - Colostomy - Left Lower Quadrant. Incisional scars - Laparotomy scar. Palpation/Percussion Palpation and Percussion of the abdomen reveal - Soft, Non Tender, No Rigidity (guarding), No hepatosplenomegaly and No Palpable abdominal masses.  Neurologic Neurologic evaluation reveals -alert and oriented x 3 with no impairment of recent or remote memory, normal attention span and ability to concentrate, normal sensation and normal coordination.  Musculoskeletal Normal Exam - Bilateral-Upper Extremity Strength Normal and Lower Extremity Strength Normal.    Assessment & Plan   COLOSTOMY IN PLACE (Z93.3) Impression: 67 year old male status post Hartman's procedure in June 2019. He has completed his colonoscopy and is ready to schedule colostomy reversal. I discussed this with him in detail. All questions were answered.  Pt underwent cardiac clearance and echo 11/21.  He has been cleared for surgery. The surgery and anatomy were described to the patient as well as the risks of surgery and the possible complications. These include: Bleeding,  deep abdominal infections and possible wound complications such as hernia and infection, damage to adjacent structures, leak of surgical connections, which can lead to other surgeries and possibly an ostomy, possible need for other procedures, such as abscess drains in radiology, possible prolonged hospital stay, possible diarrhea from removal of part of the colon, possible constipation from narcotics, possible bowel, bladder or sexual dysfunction if having rectal surgery, prolonged fatigue/weakness or appetite loss, possible early recurrence of of disease, possible complications of their medical problems such as heart disease or arrhythmias or lung problems, death (less than 1%). I believe the patient understands and wishes to proceed with the surgery.

## 2018-10-25 NOTE — Anesthesia Procedure Notes (Signed)
Procedure Name: Intubation Date/Time: 10/25/2018 9:08 AM Performed by: Colin Rhein, CRNA Pre-anesthesia Checklist: Patient identified, Emergency Drugs available, Suction available, Patient being monitored and Timeout performed Patient Re-evaluated:Patient Re-evaluated prior to induction Oxygen Delivery Method: Circle system utilized Preoxygenation: Pre-oxygenation with 100% oxygen Induction Type: IV induction Ventilation: Mask ventilation without difficulty Laryngoscope Size: Mac and 4 Grade View: Grade I Tube type: Oral Tube size: 7.5 mm Number of attempts: 1 Airway Equipment and Method: Stylet Secured at: 23 cm Tube secured with: Tape Dental Injury: Teeth and Oropharynx as per pre-operative assessment

## 2018-10-25 NOTE — Anesthesia Postprocedure Evaluation (Signed)
Anesthesia Post Note  Patient: [name]  Procedure(s) Performed: LAPAROSCOPIC COLOSTOMY REVERSAL (N/A Abdomen)     Patient location during evaluation: PACU Anesthesia Type: General Level of consciousness: awake and alert, awake and oriented Pain management: pain level controlled Vital Signs Assessment: post-procedure vital signs reviewed and stable Respiratory status: spontaneous breathing, nonlabored ventilation and respiratory function stable Cardiovascular status: blood pressure returned to baseline and stable Postop Assessment: no apparent nausea or vomiting Anesthetic complications: no    Last Vitals:  Vitals:   10/25/18 1231 10/25/18 1327  BP: (!) 166/89 (!) 151/77  Pulse: 60 (!) 59  Resp: 14   Temp: 36.6 C 36.5 C  SpO2: 98% 94%    Last Pain:  Vitals:   10/25/18 1327  TempSrc: Oral  PainSc:                  Cecile HearingStephen Edward Kila Godina

## 2018-10-25 NOTE — Anesthesia Preprocedure Evaluation (Addendum)
Anesthesia Evaluation  Patient identified by MRN, date of birth, ID band Patient awake    Reviewed: Allergy & Precautions, NPO status , Patient's Chart, lab work & pertinent test results  Airway Mallampati: III  TM Distance: >3 FB Neck ROM: Full    Dental  (+) Teeth Intact, Dental Advisory Given, Implants   Pulmonary asthma , former smoker,    Pulmonary exam normal breath sounds clear to auscultation       Cardiovascular hypertension, Pt. on medications (-) CAD and (-) Past MI Normal cardiovascular exam+ dysrhythmias (LBBB)  Rhythm:Regular Rate:Normal  Echo 10/24/18: EF 40%, valves OK, mild LVH   Neuro/Psych negative neurological ROS  negative psych ROS   GI/Hepatic Neg liver ROS, Diverticular disease s/p sigmoid colectomy and colostomy   Endo/Other  diabetes, Well Controlled, Type 2Obesity   Renal/GU negative Renal ROS     Musculoskeletal negative musculoskeletal ROS (+)   Abdominal   Peds  Hematology negative hematology ROS (+)   Anesthesia Other Findings Day of surgery medications reviewed with the patient.  Reproductive/Obstetrics                           Anesthesia Physical Anesthesia Plan  ASA: III  Anesthesia Plan: General   Post-op Pain Management:    Induction: Intravenous  PONV Risk Score and Plan: 3 and Midazolam, Dexamethasone and Ondansetron  Airway Management Planned: Oral ETT  Additional Equipment:   Intra-op Plan:   Post-operative Plan: Extubation in OR  Informed Consent: I have reviewed the patients History and Physical, chart, labs and discussed the procedure including the risks, benefits and alternatives for the proposed anesthesia with the patient or authorized representative who has indicated his/her understanding and acceptance.   Dental advisory given  Plan Discussed with: CRNA  Anesthesia Plan Comments:        Anesthesia Quick Evaluation

## 2018-10-25 NOTE — Transfer of Care (Signed)
Immediate Anesthesia Transfer of Care Note  Patient: [name]  Procedure(s) Performed: LAPAROSCOPIC COLOSTOMY REVERSAL (N/A Abdomen)  Patient Location: PACU  Anesthesia Type:General  Level of Consciousness: sedated  Airway & Oxygen Therapy: Patient Spontanous Breathing and Patient connected to face mask oxygen  Post-op Assessment: Report given to RN and Post -op Vital signs reviewed and stable  Post vital signs: Reviewed and stable  Last Vitals:  Vitals Value Taken Time  BP    Temp    Pulse 60 10/25/2018 11:12 AM  Resp 17 10/25/2018 11:12 AM  SpO2 98 % 10/25/2018 11:12 AM  Vitals shown include unvalidated device data.  Last Pain:  Vitals:   10/25/18 0724  TempSrc:   PainSc: 0-No pain      Patients Stated Pain Goal: 4 (10/25/18 0724)  Complications: No apparent anesthesia complications

## 2018-10-25 NOTE — Op Note (Signed)
10/25/2018  10:58 AM  PATIENT:  [name]  67 y.o. male  Patient Care Team: Andreas Blowerabeza, Yuri M., MD as PCP - General (Internal Medicine)  PRE-OPERATIVE DIAGNOSIS:  Diverticular disease, colostomy  POST-OPERATIVE DIAGNOSIS:  Diverticular disease, colostomy  PROCEDURE:  LAPAROSCOPIC COLOSTOMY REVERSAL   Surgeon(s): [name], Shelie Lansing, MD Darnell LevelGerkin, Todd, MD  ASSISTANT: Dr Gerrit FriendsGerkin   ANESTHESIA:   local and general  EBL: 50ml Total I/O In: 1000 [I.V.:1000] Out: 100 [Urine:100]  Delay start of Pharmacological VTE agent (>24hrs) due to surgical blood loss or risk of bleeding:  no  DRAINS: none   SPECIMEN:  Source of Specimen:  colostomy  DISPOSITION OF SPECIMEN:  PATHOLOGY  COUNTS:  YES  PLAN OF CARE: Admit to inpatient   PATIENT DISPOSITION:  PACU - hemodynamically stable.  INDICATION:    67 y.o. M with colostomy due to Hartman's procedure.  I recommended segmental resection:  The anatomy & physiology of the digestive tract was discussed.  The pathophysiology was discussed.  Natural history risks without surgery was discussed.   I worked to give an overview of the disease and the frequent need to have multispecialty involvement.  I feel the risks of no intervention will lead to serious problems that outweigh the operative risks; therefore, I recommended a partial colectomy to remove the pathology.  Laparoscopic & open techniques were discussed.   Risks such as bleeding, infection, abscess, leak, reoperation, possible ostomy, hernia, heart attack, death, and other risks were discussed.  I noted a good likelihood this will help address the problem.   Goals of post-operative recovery were discussed as well.    The patient expressed understanding & wished to proceed with surgery.  OR FINDINGS:   Patient had minimal adhesions.  No hernia  The anastomosis rests 18 cm from the anal verge by rigid proctoscopy.  DESCRIPTION:   Informed consent was confirmed.  The patient underwent  general anaesthesia without difficulty.  The patient was positioned appropriately.  VTE prevention in place.  The patient's abdomen was clipped, prepped, & draped in a sterile fashion.  Surgical timeout confirmed our plan.  The patient was positioned in reverse Trendelenburg.  Abdominal entry was gained using a Varies needle in the LUQ.  Entry was clean.  I induced carbon dioxide insufflation.  Camera inspection revealed no injury.  Extra ports were carefully placed under direct laparoscopic visualization.  The omentum was taken down using the laparoscopic Enseal device.  This was then freed from the left lateral sidewall and pelvis using mostly blunt dissection.  The remainder of the omentum was then freed from the mesenteric border using blunt dissection and the Enseal device.   I reflected the greater omentum and the upper abdomen the small bowel in the upper abdomen. The patient had no real adhesions to the small bowel.  I mobilized the colon up the left lateral sidewall with the Enseal device and blunt dissection.   Once this was completed we evaluated the rectal stump.  We passed rectal dilators through to make sure there were no strictures.  There was a small stricture in the mid sigmoid which was dilated appropriately.  There did not appear to be any remaining sigmoid on the rectal stump.  I then took down the colostomy using open technique.  Once this was free from the fascial edge and subcutaneous tissues, I resected the distal end of the colostomy over a pursestring device.  A 2-0 Prolene pursestring was passed and secured into place with interrupted 3-0 silk  sutures.  A 31 mm EEA anvil was then placed into the colon and the pursestring was tied tightly around this.  The fat was cleared from around the anvil edge and this was then dropped back into the abdomen.  An Alexis wound protector was placed.  The abdomen was then reinsufflated.  I evaluated for mobility into the pelvis.  I mobilized the left  colon in a lateral to medial fashion off the line of Toldt up towards the splenic flexure to ensure good mobilization of the left colon to reach into the pelvis.  Once this could easily reach into the pelvis, the EEA was passed into the rectum and brought out through the end of the rectal stump.  An anastomosis was created without difficulty.  There was no tension.  There was no leak when tested with insufflation under water.  My partner evaluated the mid rectal area and saw some bruising but no sign of perforation.  The staple line was hemostatic and noted to be approximate 18 cm from the anal verge.  The omentum was then brought down over the abdominal contents and the abdomen was irrigated with normal saline.  Hemostasis was good.  We then desufflated the abdomen and removed the ports and Alexis wound protector.  We switched to clean gowns, gloves, instruments and drapes.  Once this was complete, the fascia of the ostomy site was closed using interrupted #1 Novafil sutures.  The subcutaneous tissue was reapproximated with a 2-0 running pursestring suture.  The dermal layer was also closed partially using a 2-0 running pursestring suture.  A Telfa wick was placed in the middle of this and a sterile dressing was applied.  The remaining port sites were closed using interrupted 4-0 Vicryl sutures and Dermabond.  The patient was then awakened from anesthesia and sent to the postanesthesia care unit stable condition.  All counts were correct per operating room staff.  An MD assistant was necessary for tissue manipulation, retraction and positioning due to the complexity of the case and hospital policies

## 2018-10-26 ENCOUNTER — Encounter (HOSPITAL_COMMUNITY): Payer: Self-pay | Admitting: General Surgery

## 2018-10-26 LAB — BASIC METABOLIC PANEL
Anion gap: 10 (ref 5–15)
BUN: 21 mg/dL (ref 8–23)
CALCIUM: 9 mg/dL (ref 8.9–10.3)
CO2: 23 mmol/L (ref 22–32)
CREATININE: 1.1 mg/dL (ref 0.61–1.24)
Chloride: 101 mmol/L (ref 98–111)
GFR calc Af Amer: >60 mL/min (ref >60)
Glucose, Bld: 151 mg/dL — ABNORMAL HIGH (ref 70–99)
POTASSIUM: 4.6 mmol/L (ref 3.5–5.1)
SODIUM: 134 mmol/L — AB (ref 135–145)

## 2018-10-26 LAB — CBC
HCT: 41.2 % (ref 39.0–52.0)
Hemoglobin: 13.9 g/dL (ref 13.0–17.0)
MCH: 31.4 pg (ref 26.0–34.0)
MCHC: 33.7 g/dL (ref 30.0–36.0)
MCV: 93.2 fL (ref 80.0–100.0)
PLATELETS: 336 10*3/uL (ref 150–400)
RBC: 4.42 MIL/uL (ref 4.22–5.81)
RDW: 12.1 % (ref 11.5–15.5)
WBC: 15.5 10*3/uL — ABNORMAL HIGH (ref 4.0–10.5)
nRBC: 0 % (ref 0.0–0.2)

## 2018-10-26 NOTE — Progress Notes (Signed)
1 Day Post-Op   Subjective/Chief Complaint: No c/o other than some mild RT sided discomfort No n/v Eating solids now Walked 11x so far today Had 2 bms.    Objective: Vital signs in last 24 hours: Temp:  [97.4 F (36.3 C)-98 F (36.7 C)] 98 F (36.7 C) (11/23 0537) Pulse Rate:  [53-62] 55 (11/23 0537) Resp:  [18-20] 20 (11/23 0537) BP: (121-151)/(75-80) 135/80 (11/23 0537) SpO2:  [94 %-99 %] 96 % (11/23 0537) Weight:  [82.5 kg-84.6 kg] 82.5 kg (11/23 0605)    Intake/Output from previous day: 11/22 0701 - 11/23 0700 In: 3061.8 [P.O.:1080; I.V.:1881.8; IV Piggyback:100] Out: 2335 [Urine:2235; Blood:100] Intake/Output this shift: Total I/O In: 240 [P.O.:240] Out: -   Alert, nontoxic cta Reg Soft, mild distension, dressing intact, min TTTP No edema  Lab Results:  Recent Labs    10/26/18 0320  WBC 15.5*  HGB 13.9  HCT 41.2  PLT 336   BMET Recent Labs    10/26/18 0320  NA 134*  K 4.6  CL 101  CO2 23  GLUCOSE 151*  BUN 21  CREATININE 1.10  CALCIUM 9.0   PT/INR No results for input(s): LABPROT, INR in the last 72 hours. ABG No results for input(s): PHART, HCO3 in the last 72 hours.  Invalid input(s): PCO2, PO2  Studies/Results: No results found.  Anti-infectives: Anti-infectives (From admission, onward)   Start     Dose/Rate Route Frequency Ordered Stop   10/25/18 2100  cefoTEtan (CEFOTAN) 2 g in sodium chloride 0.9 % 100 mL IVPB     2 g 200 mL/hr over 30 Minutes Intravenous Every 12 hours 10/25/18 1249 10/25/18 2317   10/25/18 0715  cefoTEtan (CEFOTAN) 2 g in sodium chloride 0.9 % 100 mL IVPB     2 g 200 mL/hr over 30 Minutes Intravenous On call to O.R. 10/25/18 0702 10/25/18 0940      Assessment/Plan: s/p Procedure(s): LAPAROSCOPIC COLOSTOMY REVERSAL (N/A) By Dr Maisie Fushomas  Doing well Cont diet as tolerated ERAS meds Ambulate vte prophylaxis If no issues today, plan dc in am  LOS: 1 day    [name] 10/26/2018

## 2018-10-27 LAB — BASIC METABOLIC PANEL
ANION GAP: 5 (ref 5–15)
BUN: 24 mg/dL — ABNORMAL HIGH (ref 8–23)
CHLORIDE: 103 mmol/L (ref 98–111)
CO2: 30 mmol/L (ref 22–32)
CREATININE: 1.1 mg/dL (ref 0.61–1.24)
Calcium: 8.9 mg/dL (ref 8.9–10.3)
GFR calc non Af Amer: >60 mL/min (ref >60)
Glucose, Bld: 121 mg/dL — ABNORMAL HIGH (ref 70–99)
POTASSIUM: 4.3 mmol/L (ref 3.5–5.1)
SODIUM: 138 mmol/L (ref 135–145)

## 2018-10-27 LAB — CBC
HCT: 37.8 % — ABNORMAL LOW (ref 39.0–52.0)
HEMOGLOBIN: 12.5 g/dL — AB (ref 13.0–17.0)
MCH: 31.3 pg (ref 26.0–34.0)
MCHC: 33.1 g/dL (ref 30.0–36.0)
MCV: 94.5 fL (ref 80.0–100.0)
Platelets: 301 10*3/uL (ref 150–400)
RBC: 4 MIL/uL — AB (ref 4.22–5.81)
RDW: 12.3 % (ref 11.5–15.5)
WBC: 11.8 10*3/uL — AB (ref 4.0–10.5)
nRBC: 0 % (ref 0.0–0.2)

## 2018-10-27 MED ORDER — TRAMADOL HCL 50 MG PO TABS: 50.0000 mg | ORAL_TABLET | Freq: Four times a day (QID) | ORAL | 0 refills | Status: DC | PRN

## 2018-10-27 NOTE — Discharge Summary (Signed)
Physician Discharge Summary  [name] ZOX:[id number] DOB: 11/03/1951 DOA: 10/25/2018  PCP: Roger Blowerabeza, Yuri M., MD  Admit date: 10/25/2018 Discharge date: 10/27/2018  Recommendations for Outpatient Follow-up:    Follow-up Information    Roger Leveehomas, [name], MD. Schedule an appointment as soon as possible for a visit in 2 week(s).   Specialty:  General Surgery Why:  for postop check Contact information: [address] [place] [id number] [phone]          Discharge Diagnoses:  1. H/o diverticulitis with Hartman's procedure 2. Undesired colostomy 3. HTN  Surgical Procedure: laparoscopic colostomy reversal Dr Maisie Fushomas  Discharge Condition: good Disposition: home  Diet recommendation: soft diet  Filed Weights   10/25/18 0724 10/26/18 0537 10/26/18 0605  Weight: 79.8 kg 84.6 kg 82.5 kg    History of present illness:  post op Hartmann's for diverticular disease 05/17/18  Patient presents to the office today accompanied by his wife to discuss timing of colostomy closure. Patient states that his colostomy function has been good. His wound is now healed. He is anxious to proceed with colostomy closure. He has completed his colonoscopy for surveillance. One polyp was seen. He is ready to schedule surgery.   Hospital Course:  Taken to surgery by Dr Maisie FusHomas. ERAS protocol used. Postop did well. Started on clears and rapidly advanced. Had bowel function on morning POD 1. Maintained on chemical vte prophylaxis On POD 2 doing well. Tolerating diet. Vitals stable. Wound ok. Pain well controlled  Discussed dc instructions with pt and wife extensively  BP 133/80 (BP Location: Right Arm)   Pulse (!) 56   Temp 97.7 F (36.5 C) (Oral)   Resp 16   Ht 5\' 5"  (1.651 m)   Wt 82.5 kg   SpO2 94%   BMI 30.27 kg/m   Gen: alert, NAD, non-toxic appearing Pupils: equal, no scleral icterus Pulm: Lungs clear to auscultation, symmetric chest rise CV: regular rate and  rhythm Abd: soft, min tender, nondistended. [name] removed from ostomy site. No cellulitis. No incisional hernia Ext: no edema, no calf tenderness Skin: no rash, no jaundice    Discharge Instructions  Discharge Instructions    Call MD for:   Complete by:  As directed    Temperature >101   Call MD for:  hives   Complete by:  As directed    Call MD for:  persistant dizziness or light-headedness   Complete by:  As directed    Call MD for:  persistant nausea and vomiting   Complete by:  As directed    Call MD for:  redness, tenderness, or signs of infection (pain, swelling, redness, odor or green/yellow discharge around incision site)   Complete by:  As directed    Call MD for:  severe uncontrolled pain   Complete by:  As directed    Diet - low sodium heart healthy   Complete by:  As directed    Discharge instructions   Complete by:  As directed    See [facility] discharge instructions   Increase activity slowly   Complete by:  As directed      Allergies as of 10/27/2018      Reactions   Hibiclens [chlorhexidine Gluconate] Rash, Other (See Comments)   Pt. Had rash on torso after last surgery, mostly on surgical side.  Am listing CHG as potential allergy.   Flagyl [metronidazole] Other (See Comments)   Can't remember reaction      Medication List  TAKE these medications   acetaminophen 500 MG tablet Commonly known as:  TYLENOL Take 2 tablets (1,000 mg total) by mouth every 8 (eight) hours as needed.   albuterol 108 (90 Base) MCG/ACT inhaler Commonly known as:  PROVENTIL HFA;VENTOLIN HFA Inhale 2 puffs into the lungs every 6 (six) hours as needed for wheezing.   allopurinol 300 MG tablet Commonly known as:  ZYLOPRIM Take 300 mg by mouth daily.   allopurinol 100 MG tablet Commonly known as:  ZYLOPRIM Take 100 mg by mouth daily.   ARTIFICIAL TEARS 1.4 % ophthalmic solution Generic drug:  polyvinyl alcohol Place 1 drop into both eyes daily as needed for dry eyes.    fenofibrate 160 MG tablet Take 160 mg by mouth daily.   lisinopril 40 MG tablet Commonly known as:  PRINIVIL,ZESTRIL Take 20 mg by mouth daily.   rosuvastatin 40 MG tablet Commonly known as:  CRESTOR Take 40 mg by mouth at bedtime.   traMADol 50 MG tablet Commonly known as:  ULTRAM Take 1 tablet (50 mg total) by mouth every 6 (six) hours as needed for severe pain.      Follow-up Information    Roger Levee, MD. Schedule an appointment as soon as possible for a visit in 2 week(s).   Specialty:  General Surgery Why:  for postop check Contact information: [address] [place] [address] [phone]            The results of significant diagnostics from this hospitalization (including imaging, microbiology, ancillary and laboratory) are listed below for reference.    Significant Diagnostic Studies: No results found.  Microbiology: No results found for this or any previous visit (from the past 240 hour(s)).   Labs: Basic Metabolic Panel: Recent Labs  Lab 10/22/18 1110 10/26/18 0320 10/27/18 0350  NA 137 134* 138  K 4.5 4.6 4.3  CL 102 101 103  CO2 25 23 30   GLUCOSE 148* 151* 121*  BUN 35* 21 24*  CREATININE 1.25* 1.10 1.10  CALCIUM 9.8 9.0 8.9   Liver Function Tests: No results for input(s): AST, ALT, ALKPHOS, BILITOT, PROT, ALBUMIN in the last 168 hours. No results for input(s): LIPASE, AMYLASE in the last 168 hours. No results for input(s): AMMONIA in the last 168 hours. CBC: Recent Labs  Lab 10/22/18 1110 10/26/18 0320 10/27/18 0350  WBC 8.0 15.5* 11.8*  HGB 14.8 13.9 12.5*  HCT 45.0 41.2 37.8*  MCV 96.6 93.2 94.5  PLT 374 336 301   Cardiac Enzymes: No results for input(s): CKTOTAL, CKMB, CKMBINDEX, TROPONINI in the last 168 hours. BNP: BNP (last 3 results) No results for input(s): BNP in the last 8760 hours.  ProBNP (last 3 results) No results for input(s): PROBNP in the last 8760 hours.  CBG: Recent Labs  Lab  10/22/18 1039 10/25/18 0743 10/25/18 1128  GLUCAP 154* 98 139*    Active Problems:   Diverticular disease   Time coordinating discharge: 25 min  Signed:  Atilano Ina, MD [facility], [place] [phone] 10/27/2018, 11:09 AM

## 2018-10-27 NOTE — Discharge Instructions (Signed)
SURGERY: POST OP INSTRUCTIONS °(Surgery for small bowel obstruction, colon resection, etc) ° ° °###################################################################### ° °EAT °Gradually transition to a high fiber diet with a fiber supplement over the next few days after discharge ° °WALK °Walk an hour a day.  Control your pain to do that.   ° °CONTROL PAIN °Control pain so that you can walk, sleep, tolerate sneezing/coughing, go up/down stairs. ° °HAVE A BOWEL MOVEMENT DAILY °Keep your bowels regular to avoid problems.  OK to try a laxative to override constipation.  OK to use an antidairrheal to slow down diarrhea.  Call if not better after 2 tries ° °CALL IF YOU HAVE PROBLEMS/CONCERNS °Call if you are still struggling despite following these instructions. °Call if you have concerns not answered by these instructions ° °###################################################################### ° ° °DIET °Follow a light diet the first few days at home.  Start with a bland diet such as soups, liquids, starchy foods, low fat foods, etc.  If you feel full, bloated, or constipated, stay on a ful liquid or pureed/blenderized diet for a few days until you feel better and no longer constipated. °Be sure to drink plenty of fluids every day to avoid getting dehydrated (feeling dizzy, not urinating, etc.). °Gradually add a fiber supplement to your diet over the next week.  Gradually get back to a regular solid diet.  Avoid fast food or heavy meals the first week as you are more likely to get nauseated. °It is expected for your digestive tract to need a few months to get back to normal.  It is common for your bowel movements and stools to be irregular.  You will have occasional bloating and cramping that should eventually fade away.  Until you are eating solid food normally, off all pain medications, and back to regular activities; your bowels will not be normal. °Focus on eating a low-fat, high fiber diet the rest of your life  (See Getting to Good Bowel Health, below). ° °CARE of your INCISION or WOUND °It is good for closed incision and even open wounds to be washed every day.  Shower every day.  Short baths are fine.  Wash the incisions and wounds clean with soap & water.    °If you have a closed incision(s), wash the incision with soap & water every day.  You may leave closed incisions open to air if it is dry.   You may cover the incision with clean gauze & replace it after your daily shower for comfort. °If you have skin tapes (Steristrips) or skin glue (Dermabond) on your incision, leave them in place.  They will fall off on their own like a scab.  You may trim any edges that curl up with clean scissors.  If you have staples, set up an appointment for them to be removed in the office in 10 days after surgery.  °If you have a drain, wash around the skin exit site with soap & water and place a new dressing of gauze or band aid around the skin every day.  Keep the drain site clean & dry.    °If you have an open wound with packing, see wound care instructions.  In general, it is encouraged that you remove your dressing and packing, shower with soap & water, and replace your dressing once a day.  Pack the wound with clean gauze moistened with normal (0.9%) saline to keep the wound moist & uninfected.  Pressure on the dressing for 30 minutes will stop most wound   bleeding.  Eventually your body will heal & pull the open wound closed over the next few months.  °Raw open wounds will occasionally bleed or secrete yellow drainage until it heals closed.  Drain sites will drain a little until the drain is removed.  Even closed incisions can have mild bleeding or drainage the first few days until the skin edges scab over & seal.   °If you have an open wound with a wound vac, see wound vac care instructions. ° ° ° ° °ACTIVITIES as tolerated °Start light daily activities --- self-care, walking, climbing stairs-- beginning the day after surgery.   Gradually increase activities as tolerated.  Control your pain to be active.  Stop when you are tired.  Ideally, walk several times a day, eventually an hour a day.   °Most people are back to most day-to-day activities in a few weeks.  It takes 4-8 weeks to get back to unrestricted, intense activity. °If you can walk 30 minutes without difficulty, it is safe to try more intense activity such as jogging, treadmill, bicycling, low-impact aerobics, swimming, etc. °Save the most intensive and strenuous activity for last (Usually 4-8 weeks after surgery) such as sit-ups, heavy lifting, contact sports, etc.  Refrain from any intense heavy lifting or straining until you are off narcotics for pain control.  You will have off days, but things should improve week-by-week. °DO NOT PUSH THROUGH PAIN.  Let pain be your guide: If it hurts to do something, don't do it.  Pain is your body warning you to avoid that activity for another week until the pain goes down. °You may drive when you are no longer taking narcotic prescription pain medication, you can comfortably wear a seatbelt, and you can safely make sudden turns/stops to protect yourself without hesitating due to pain. °You may have sexual intercourse when it is comfortable. If it hurts to do something, stop. ° °MEDICATIONS °Take your usually prescribed home medications unless otherwise directed.   °Blood thinners:  °Usually you can restart any strong blood thinners after the second postoperative day.  It is OK to take aspirin right away.    ° If you are on strong blood thinners (warfarin/Coumadin, Plavix, Xerelto, Eliquis, Pradaxa, etc), discuss with your surgeon, medicine PCP, and/or cardiologist for instructions on when to restart the blood thinner & if blood monitoring is needed (PT/INR blood check, etc).   ° ° °PAIN CONTROL °Pain after surgery or related to activity is often due to strain/injury to muscle, tendon, nerves and/or incisions.  This pain is usually  short-term and will improve in a few months.  °To help speed the process of healing and to get back to regular activity more quickly, DO THE FOLLOWING THINGS TOGETHER: °1. Increase activity gradually.  DO NOT PUSH THROUGH PAIN °2. Use Ice and/or Heat °3. Try Gentle Massage and/or Stretching °4. Take over the counter pain medication °5. Take Narcotic prescription pain medication for more severe pain ° °Good pain control = faster recovery.  It is better to take more medicine to be more active than to stay in bed all day to avoid medications. °1.  Increase activity gradually °Avoid heavy lifting at first, then increase to lifting as tolerated over the next 6 weeks. °Do not “push through” the pain.  Listen to your body and avoid positions and maneuvers than reproduce the pain.  Wait a few days before trying something more intense °Walking an hour a day is encouraged to help your body recover faster   and more safely.  Start slowly and stop when getting sore.  If you can walk 30 minutes without stopping or pain, you can try more intense activity (running, jogging, aerobics, cycling, swimming, treadmill, sex, sports, weightlifting, etc.) °Remember: If it hurts to do it, then don’t do it! °2. Use Ice and/or Heat °You will have swelling and bruising around the incisions.  This will take several weeks to resolve. °Ice packs or heating pads (6-8 times a day, 30-60 minutes at a time) will help sooth soreness & bruising. °Some people prefer to use ice alone, heat alone, or alternate between ice & heat.  Experiment and see what works best for you.  Consider trying ice for the first few days to help decrease swelling and bruising; then, switch to heat to help relax sore spots and speed recovery. °Shower every day.  Short baths are fine.  It feels good!  Keep the incisions and wounds clean with soap & water.   °3. Try Gentle Massage and/or Stretching °Massage at the area of pain many times a day °Stop if you feel pain - do not  overdo it °4. Take over the counter pain medication °This helps the muscle and nerve tissues become less irritable and calm down faster °Choose ONE of the following over-the-counter anti-inflammatory medications: °Acetaminophen 500mg tabs (Tylenol) 1-2 pills with every meal and just before bedtime (avoid if you have liver problems or if you have acetaminophen in you narcotic prescription) °Naproxen 220mg tabs (ex. Aleve, Naprosyn) 1-2 pills twice a day (avoid if you have kidney, stomach, IBD, or bleeding problems) °Ibuprofen 200mg tabs (ex. Advil, Motrin) 3-4 pills with every meal and just before bedtime (avoid if you have kidney, stomach, IBD, or bleeding problems) °Take with food/snack several times a day as directed for at least 2 weeks to help keep pain / soreness down & more manageable. °5. Take Narcotic prescription pain medication for more severe pain °A prescription for strong pain control is often given to you upon discharge (for example: oxycodone/Percocet, hydrocodone/Norco/Vicodin, or tramadol/Ultram) °Take your pain medication as prescribed. °Be mindful that most narcotic prescriptions contain Tylenol (acetaminophen) as well - avoid taking too much Tylenol. °If you are having problems/concerns with the prescription medicine (does not control pain, nausea, vomiting, rash, itching, etc.), please call us [phone] to see if we need to switch you to a different pain medicine that will work better for you and/or control your side effects better. °If you need a refill on your pain medication, you must call the office before 4 pm and on weekdays only.  By federal law, prescriptions for narcotics cannot be called into a pharmacy.  They must be filled out on paper & picked up from our office by the patient or authorized caretaker.  Prescriptions cannot be filled after 4 pm nor on weekends.   ° °WHEN TO CALL US [phone] °Severe uncontrolled or worsening pain  °Fever over 101 F (38.5 C) °Concerns with  the incision: Worsening pain, redness, rash/hives, swelling, bleeding, or drainage °Reactions / problems with new medications (itching, rash, hives, nausea, etc.) °Nausea and/or vomiting °Difficulty urinating °Difficulty breathing °Worsening fatigue, dizziness, lightheadedness, blurred vision °Other concerns °If you are not getting better after two weeks or are noticing you are getting worse, contact our office [phone] for further advice.  We may need to adjust your medications, re-evaluate you in the office, send you to the emergency room, or see what other things we can do to help. °The   clinic staff is available to answer your questions during regular business hours (8:30am-5pm).  Please don’t hesitate to call and ask to speak to one of our nurses for clinical concerns.    °A surgeon from [facility] is always on call at the hospitals 24 hours/day °If you have a medical emergency, go to the nearest emergency room or call 911. ° °FOLLOW UP in our office °One the day of your discharge from the hospital (or the next business weekday), please call [facility] to set up or confirm an appointment to see your surgeon in the office for a follow-up appointment.  Usually it is 2-3 weeks after your surgery.   °If you have skin staples at your incision(s), let the office know so we can set up a time in the office for the nurse to remove them (usually around 10 days after surgery). °Make sure that you call for appointments the day of discharge (or the next business weekday) from the hospital to ensure a convenient appointment time. °IF YOU HAVE DISABILITY OR FAMILY LEAVE FORMS, BRING THEM TO THE OFFICE FOR PROCESSING.  DO NOT GIVE THEM TO YOUR DOCTOR. ° °[facility] °[address], [address], [place], [place]  [address] ? °[phone] - Main °[phone] - Toll Free,  [phone] - Fax °[email or web address] ° °GETTING TO GOOD BOWEL HEALTH. °It is  expected for your digestive tract to need a few months to get back to normal.  It is common for your bowel movements and stools to be irregular.  You will have occasional bloating and cramping that should eventually fade away.  Until you are eating solid food normally, off all pain medications, and back to regular activities; your bowels will not be normal.   °Avoiding constipation °The goal: ONE SOFT BOWEL MOVEMENT A DAY!    °Drink plenty of fluids.  Choose water first. °TAKE A FIBER SUPPLEMENT EVERY DAY THE REST OF YOUR LIFE °During your first week back home, gradually add back a fiber supplement every day °Experiment which form you can tolerate.   There are many forms such as powders, tablets, wafers, gummies, etc °Psyllium bran (Metamucil), methylcellulose (Citrucel), Miralax or Glycolax, Benefiber, Flax Seed.  °Adjust the dose week-by-week (1/2 dose/day to 6 doses a day) until you are moving your bowels 1-2 times a day.  Cut back the dose or try a different fiber product if it is giving you problems such as diarrhea or bloating. °Sometimes a laxative is needed to help jump-start bowels if constipated until the fiber supplement can help regulate your bowels.  If you are tolerating eating & you are farting, it is okay to try a gentle laxative such as double dose MiraLax, prune juice, or Milk of Magnesia.  Avoid using laxatives too often. °Stool softeners can sometimes help counteract the constipating effects of narcotic pain medicines.  It can also cause diarrhea, so avoid using for too long. °If you are still constipated despite taking fiber daily, eating solids, and a few doses of laxatives, call our office. °Controlling diarrhea °Try drinking liquids and eating bland foods for a few days to avoid stressing your intestines further. °Avoid dairy products (especially milk & ice cream) for a short time.  The intestines often can lose the ability to digest lactose when stressed. °Avoid foods that cause gassiness or  bloating.  Typical foods include beans and other legumes, cabbage, broccoli, and dairy foods.  Avoid greasy, spicy, fast foods.  Every person has   some sensitivity to other foods, so listen to your body and avoid those foods that trigger problems for you. Probiotics (such as active yogurt, Align, etc) may help repopulate the intestines and colon with normal bacteria and calm down a sensitive digestive tract Adding a fiber supplement gradually can help thicken stools by absorbing excess fluid and retrain the intestines to act more normally.  Slowly increase the dose over a few weeks.  Too much fiber too soon can backfire and cause cramping & bloating. It is okay to try and slow down diarrhea with a few doses of antidiarrheal medicines.   Bismuth subsalicylate (ex. Kayopectate, Pepto Bismol) for a few doses can help control diarrhea.  Avoid if pregnant.   Loperamide (Imodium) can slow down diarrhea.  Start with one tablet (2mg ) first.  Avoid if you are having fevers or severe pain.  ILEOSTOMY PATIENTS WILL HAVE CHRONIC DIARRHEA since their colon is not in use.    Drink plenty of liquids.  You will need to drink even more glasses of water/liquid a day to avoid getting dehydrated. Record output from your ileostomy.  Expect to empty the bag every 3-4 hours at first.  Most people with a permanent ileostomy empty their bag 4-6 times at the least.   Use antidiarrheal medicine (especially Imodium) several times a day to avoid getting dehydrated.  Start with a dose at bedtime & breakfast.  Adjust up or down as needed.  Increase antidiarrheal medications as directed to avoid emptying the bag more than 8 times a day (every 3 hours). Work with your wound ostomy nurse to learn care for your ostomy.  See ostomy care instructions. TROUBLESHOOTING IRREGULAR BOWELS 1) Start with a soft & bland diet. No spicy, greasy, or fried foods.  2) Avoid gluten/wheat or dairy products from diet to see if symptoms improve. 3) Miralax  17gm or flax seed mixed in 8oz. water or juice-daily. May use 2-4 times a day as needed. 4) Gas-X, Phazyme, etc. as needed for gas & bloating.  5) Prilosec (omeprazole) over-the-counter as needed 6)  Consider probiotics (Align, Activa, etc) to help calm the bowels down  Call your doctor if you are getting worse or not getting better.  Sometimes further testing (cultures, endoscopy, X-ray studies, CT scans, bloodwork, etc.) may be needed to help diagnose and treat the cause of the diarrhea. [facility] [address], [place], [place]  [address] [phone] - Main.    [phone]  - Toll Free.   [phone] - Fax [email or web address]      Managing Your Pain After Surgery Without Opioids    Thank you for participating in our program to help patients manage their pain after surgery without opioids. This is part of our effort to provide you with the best care possible, without exposing you or your family to the risk that opioids pose.  What pain can I expect after surgery? You can expect to have some pain after surgery. This is normal. The pain is typically worse the day after surgery, and quickly begins to get better. Many studies have found that many patients are able to manage their pain after surgery with Over-the-Counter (OTC) medications such as Tylenol and Motrin. If you have a condition that does not allow you to take Tylenol or Motrin, notify your surgical team.  How will I manage my pain? The best strategy for controlling your pain after surgery is around the clock pain control with Tylenol (acetaminophen) and Motrin (  ibuprofen or Advil). Alternating these medications with each other allows you to maximize your pain control. In addition to Tylenol and Motrin, you can use heating pads or ice packs on your incisions to help reduce your pain.  How will I alternate your regular strength over-the-counter pain medication? You  will take a dose of pain medication every three hours. ; Start by taking 650 mg of Tylenol (2 pills of 325 mg) ; 3 hours later take 600 mg of Motrin (3 pills of 200 mg) ; 3 hours after taking the Motrin take 650 mg of Tylenol ; 3 hours after that take 600 mg of Motrin.   - 1 -  See example - if your first dose of Tylenol is at 12:00 PM   12:00 PM Tylenol 650 mg (2 pills of 325 mg)  3:00 PM Motrin 600 mg (3 pills of 200 mg)  6:00 PM Tylenol 650 mg (2 pills of 325 mg)  9:00 PM Motrin 600 mg (3 pills of 200 mg)  Continue alternating every 3 hours   We recommend that you follow this schedule around-the-clock for at least 3 days after surgery, or until you feel that it is no longer needed. Use the table on the last page of this handout to keep track of the medications you are taking. Important: Do not take more than 3000mg  of Tylenol or 3200mg  of Motrin in a 24-hour period. Do not take ibuprofen/Motrin if you have a history of bleeding stomach ulcers, severe kidney disease, &/or actively taking a blood thinner  What if I still have pain? If you have pain that is not controlled with the over-the-counter pain medications (Tylenol and Motrin or Advil) you might have what we call breakthrough pain. You will receive a prescription for a small amount of an opioid pain medication such as Oxycodone, Tramadol, or Tylenol with Codeine. Use these opioid pills in the first 24 hours after surgery if you have breakthrough pain. Do not take more than 1 pill every 4-6 hours.  If you still have uncontrolled pain after using all opioid pills, don't hesitate to call our staff using the number provided. We will help make sure you are managing your pain in the best way possible, and if necessary, we can provide a prescription for additional pain medication.   Day 1    Time  Name of Medication Number of pills taken  Amount of Acetaminophen  Pain Level   Comments  AM PM       AM PM       AM PM       AM  PM       AM PM       AM PM       AM PM       AM PM       Total Daily amount of Acetaminophen Do not take more than  3,000 mg per day      Day 2    Time  Name of Medication Number of pills taken  Amount of Acetaminophen  Pain Level   Comments  AM PM       AM PM       AM PM       AM PM       AM PM       AM PM       AM PM       AM PM       Total Daily amount of  Acetaminophen Do not take more than  3,000 mg per day      Day 3    Time  Name of Medication Number of pills taken  Amount of Acetaminophen  Pain Level   Comments  AM PM       AM PM       AM PM       AM PM          AM PM       AM PM       AM PM       AM PM       Total Daily amount of Acetaminophen Do not take more than  3,000 mg per day      Day 4    Time  Name of Medication Number of pills taken  Amount of Acetaminophen  Pain Level   Comments  AM PM       AM PM       AM PM       AM PM       AM PM       AM PM       AM PM       AM PM       Total Daily amount of Acetaminophen Do not take more than  3,000 mg per day      Day 5    Time  Name of Medication Number of pills taken  Amount of Acetaminophen  Pain Level   Comments  AM PM       AM PM       AM PM       AM PM       AM PM       AM PM       AM PM       AM PM       Total Daily amount of Acetaminophen Do not take more than  3,000 mg per day       Day 6    Time  Name of Medication Number of pills taken  Amount of Acetaminophen  Pain Level  Comments  AM PM       AM PM       AM PM       AM PM       AM PM       AM PM       AM PM       AM PM       Total Daily amount of Acetaminophen Do not take more than  3,000 mg per day      Day 7    Time  Name of Medication Number of pills taken  Amount of Acetaminophen  Pain Level   Comments  AM PM       AM PM       AM PM       AM PM       AM PM       AM PM       AM PM       AM PM       Total Daily amount of Acetaminophen Do not take more than  3,000  mg per day        For additional information about how and where to safely dispose of unused opioid medications - [employer]https://www.[employer].org  Disclaimer: This document contains information and/or instructional materials adapted from [facility] for the typical patient  with your condition. It does not replace medical advice from your health care provider because your experience may differ from that of the typical patient. Talk to your health care provider if you have any questions about this document, your condition or your treatment plan. Adapted from [facility]

## 2019-04-03 ENCOUNTER — Ambulatory Visit: Payer: Self-pay | Admitting: Surgery

## 2019-04-09 NOTE — Patient Instructions (Addendum)
[name]  04/09/2019   Your procedure is scheduled on: Monday 04/14/2019    Report to [facility] Main  Entrance              Report to Admitting at  06:45 AM    Call this number if you have problems the morning of surgery [phone]    Remember: Do not eat food or drink liquids :After Midnight.               BRUSH YOUR TEETH MORNING OF SURGERY AND RINSE YOUR MOUTH OUT, NO CHEWING GUM CANDY OR MINTS.     Take these medicines the morning of surgery with A SIP OF WATER: Allopurinol (Zyloprim). You may also use and bring your eyedrops and Albuterol inhaler if needed.  DO NOT TAKE ANY DIABETIC MEDICATIONS DAY OF YOUR SURGERY!                               You may not have any metal on your body including hair pins and              piercings  Do not wear jewelry, make-up, lotions, powders or perfumes, deodorant                         Men may shave face and neck.   Do not bring valuables to the hospital.  IS NOT             RESPONSIBLE   FOR VALUABLES.  Contacts, dentures or bridgework may not be worn into surgery.  Leave suitcase in the car. After surgery it may be brought to your room.                   Please read over the following fact sheets you were given: _____________________________________________________________________             [facility] - Preparing for Surgery Before surgery, you can play an important role.  Because skin is not sterile, your skin needs to be as free of germs as possible.  You can reduce the number of germs on your skin by washing with CHG (chlorahexidine gluconate) soap before surgery.  CHG is an antiseptic cleaner which kills germs and bonds with the skin to continue killing germs even after washing. Please DO NOT use if you have an allergy to CHG or antibacterial soaps.  If your skin becomes reddened/irritated stop using the CHG and inform your nurse when you arrive at Short Stay. Do not shave (including  legs and underarms) for at least 48 hours prior to the first CHG shower.  You may shave your face/neck. Please follow these instructions carefully:  1.  Shower with CHG Soap the night before surgery and the  morning of Surgery.  2.  If you choose to wash your hair, wash your hair first as usual with your  normal  shampoo.  3.  After you shampoo, rinse your hair and body thoroughly to remove the  shampoo.                           4.  Use CHG as you would any other liquid soap.  You can apply chg directly  to the skin and wash  Gently with a scrungie or clean washcloth.  5.  Apply the CHG Soap to your body ONLY FROM THE NECK DOWN.   Do not use on face/ open                           Wound or open sores. Avoid contact with eyes, ears mouth and genitals (private parts).                       Wash face,  Genitals (private parts) with your normal soap.             6.  Wash thoroughly, paying special attention to the area where your surgery  will be performed.  7.  Thoroughly rinse your body with warm water from the neck down.  8.  DO NOT shower/wash with your normal soap after using and rinsing off  the CHG Soap.                9.  Pat yourself dry with a clean towel.            10.  Wear clean pajamas.            11.  Place clean sheets on your bed the night of your first shower and do not  sleep with pets. Day of Surgery : Do not apply any lotions/deodorants the morning of surgery.  Please wear clean clothes to the hospital/surgery center.  FAILURE TO FOLLOW THESE INSTRUCTIONS MAY RESULT IN THE CANCELLATION OF YOUR SURGERY PATIENT SIGNATURE_________________________________  NURSE SIGNATURE__________________________________  ________________________________________________________________________

## 2019-04-10 ENCOUNTER — Other Ambulatory Visit (HOSPITAL_COMMUNITY)
Admission: RE | Admit: 2019-04-10 | Discharge: 2019-04-10 | Disposition: A | Discharge status: Home / Self Care | Payer: Medicare Other | Source: Ambulatory Visit | Attending: Surgery | Admitting: Surgery

## 2019-04-10 ENCOUNTER — Encounter (HOSPITAL_COMMUNITY): Payer: Self-pay

## 2019-04-10 ENCOUNTER — Encounter (HOSPITAL_COMMUNITY): Payer: Self-pay | Admitting: Surgery

## 2019-04-10 ENCOUNTER — Encounter (HOSPITAL_COMMUNITY)
Admission: RE | Admit: 2019-04-10 | Discharge: 2019-04-10 | Disposition: A | Discharge status: Home / Self Care | Payer: Medicare Other | Source: Ambulatory Visit | Attending: Surgery | Admitting: Surgery

## 2019-04-10 ENCOUNTER — Other Ambulatory Visit: Payer: Self-pay

## 2019-04-10 ENCOUNTER — Ambulatory Visit: Payer: Self-pay | Admitting: Surgery

## 2019-04-10 DIAGNOSIS — K432 Incisional hernia without obstruction or gangrene: Secondary | ICD-10-CM | POA: Diagnosis not present

## 2019-04-10 DIAGNOSIS — Z01812 Encounter for preprocedural laboratory examination: Secondary | ICD-10-CM | POA: Diagnosis not present

## 2019-04-10 DIAGNOSIS — Z1159 Encounter for screening for other viral diseases: Secondary | ICD-10-CM | POA: Insufficient documentation

## 2019-04-10 LAB — HEMOGLOBIN A1C
Hgb A1c MFr Bld: 6.8 % — ABNORMAL HIGH (ref 4.8–5.6)
Mean Plasma Glucose: 148.46 mg/dL

## 2019-04-10 LAB — CBC
HCT: 41.7 % (ref 39.0–52.0)
Hemoglobin: 14 g/dL (ref 13.0–17.0)
MCH: 32.2 pg (ref 26.0–34.0)
MCHC: 33.6 g/dL (ref 30.0–36.0)
MCV: 95.9 fL (ref 80.0–100.0)
Platelets: 319 10*3/uL (ref 150–400)
RBC: 4.35 MIL/uL (ref 4.22–5.81)
RDW: 12.6 % (ref 11.5–15.5)
WBC: 6.8 10*3/uL (ref 4.0–10.5)
nRBC: 0 % (ref 0.0–0.2)

## 2019-04-10 LAB — BASIC METABOLIC PANEL
Anion gap: 9 (ref 5–15)
BUN: 25 mg/dL — ABNORMAL HIGH (ref 8–23)
CO2: 24 mmol/L (ref 22–32)
Calcium: 9.8 mg/dL (ref 8.9–10.3)
Chloride: 104 mmol/L (ref 98–111)
Creatinine, Ser: 1.18 mg/dL (ref 0.61–1.24)
GFR calc Af Amer: >60 mL/min (ref >60)
GFR calc non Af Amer: >60 mL/min (ref >60)
Glucose, Bld: 181 mg/dL — ABNORMAL HIGH (ref 70–99)
Potassium: 4.3 mmol/L (ref 3.5–5.1)
Sodium: 137 mmol/L (ref 135–145)

## 2019-04-10 LAB — GLUCOSE, CAPILLARY: Glucose-Capillary: 185 mg/dL — ABNORMAL HIGH (ref 70–99)

## 2019-04-10 NOTE — Progress Notes (Signed)
Contacted pt regarding COVID 19 swab guidelines, which requires patient to return home between PAT and nasal swab testing. Pt reported that he went directly to be swabbed after leaving PAT appt.

## 2019-04-10 NOTE — Anesthesia Preprocedure Evaluation (Addendum)
Anesthesia Evaluation  Patient identified by MRN, date of birth, ID band Patient awake    Reviewed: Allergy & Precautions, NPO status , Patient's Chart, lab work & pertinent test results  Airway Mallampati: II  TM Distance: >3 FB Neck ROM: Full    Dental no notable dental hx. (+) Teeth Intact, Dental Advisory Given   Pulmonary asthma , former smoker,    Pulmonary exam normal breath sounds clear to auscultation       Cardiovascular Exercise Tolerance: Good hypertension, Pt. on medications Normal cardiovascular exam Rhythm:Regular Rate:Normal  10/22/18 EKG SB 55 W LBBB   Neuro/Psych negative neurological ROS  negative psych ROS   GI/Hepatic negative GI ROS, Neg liver ROS,   Endo/Other  diabetesCr 1.18 K+ 4.3  Renal/GU   negative genitourinary   Musculoskeletal negative musculoskeletal ROS (+)   Abdominal (+) + obese,   Peds  Hematology Hgb 14.0 Plt 319   Anesthesia Other Findings All: Cipro, Metronidazole  Reproductive/Obstetrics                           Anesthesia Physical Anesthesia Plan  ASA: III  Anesthesia Plan: General   Post-op Pain Management:    Induction: Intravenous  PONV Risk Score and Plan: 3 and Treatment may vary due to age or medical condition, Dexamethasone and Ondansetron  Airway Management Planned: Oral ETT  Additional Equipment:   Intra-op Plan:   Post-operative Plan: Extubation in OR  Informed Consent: I have reviewed the patients History and Physical, chart, labs and discussed the procedure including the risks, benefits and alternatives for the proposed anesthesia with the patient or authorized representative who has indicated his/her understanding and acceptance.     Dental advisory given  Plan Discussed with: CRNA  Anesthesia Plan Comments: (See PAT note 04/10/19, Jodell Cipro, PA-C)      Anesthesia Quick Evaluation

## 2019-04-10 NOTE — Progress Notes (Signed)
10-22-18 ( Epic) EKG  10-24-18 (Epic) ECHO  10-23-18 (Epic) LOV w/Cardiologist  05-27-18 (Epic) CXR

## 2019-04-10 NOTE — Progress Notes (Signed)
PCP: Dennis Bast, MD  CARDIOLOGIST:Alexander Paraschos, MD  INFO IN Epic: 10-22-18 EKG  10-24-18 ECHO (Chart Everywhere)  LOV w/Cardilogy 10-23-18   05-27-18  (Epic) CXR   INFO ON CHART:  BLOOD THINNERS AND LAST DOSES:None  ____________________________________  PATIENT SYMPTOMS AT TIME OF PREOP:  Hx of LBBB Diet controlled DM

## 2019-04-10 NOTE — Progress Notes (Signed)
Anesthesia Chart Review   Case:  [id number] Date/Time:  04/14/19 0830   Procedure:  OPEN  REPAIR INCISIONALHERNIA WITH MESH (N/A )   Anesthesia type:  General   Pre-op diagnosis:  INCISIONAL HERNIA   Location:  [facility] ROOM 02 / [facility]   Surgeon:  Darnell Level, MD      DISCUSSION: 68 yo former smoker with h/o HTN, hyperlipemia, asthma, diabetes (diet controlled), s/p urgent laparotomy and Hartman's resection 05/2018, colostomy closure 11/19 (anesthesia records reviewed with no anesthesia complications noted), incisional hernia scheduled for above procedure 04/14/19 with Dr. Darnell Level.   Pt last seen by PCP, Dr. Dennis Bast, 02/18/2019.  Stable at this visit with 3 month follow up recommended.    Pt can proceed with planned procedure barring acute status change.  VS: BP (!) 181/68   Pulse (!) 59   Temp 36.7 C (Oral)   Resp 18   Ht 5\' 5"  (1.651 m)   Wt 87.3 kg   SpO2 100%   BMI 32.02 kg/m   PROVIDERS: Andreas Blower., MD is PCP   Marcina Millard, MD is Cardiologist  LABS: Labs reviewed: Acceptable for surgery. (all labs ordered are listed, but only abnormal results are displayed)  Labs Reviewed  HEMOGLOBIN A1C - Abnormal; Notable for the following components:      Result Value   Hgb A1c MFr Bld 6.8 (*)    All other components within normal limits  BASIC METABOLIC PANEL - Abnormal; Notable for the following components:   Glucose, Bld 181 (*)    BUN 25 (*)    All other components within normal limits  GLUCOSE, CAPILLARY - Abnormal; Notable for the following components:   Glucose-Capillary 185 (*)    All other components within normal limits  CBC     IMAGES: CT Angio Chest PE 05/27/18 IMPRESSION: 1. Coronary arteriosclerosis noted of the LAD. 2. Aortic atherosclerosis without aneurysm or dissection. 3. No acute pulmonary embolus. 4. No active pulmonary disease.  EKG: 10/22/18 Rate 55 bpm Sinus bradycardia Left bundle branch block Abnormal ECG No significant  change since last tracing   CV: Echo 10/24/18 INTERPRETATION MODERATE LV SYSTOLIC DYSFUNCTION (See above)   WITH MILD LVH NORMAL RIGHT VENTRICULAR SYSTOLIC FUNCTION MILD VALVULAR REGURGITATION (See above) NO VALVULAR STENOSIS TRIVIAL AR, MR MILD TR EF 35-40% Past Medical History:  Diagnosis Date  . Asthma   . Back pain   . Diabetes mellitus    diet controlled   . Diverticular disease   . Environmental allergies   . History of diverticulitis of colon 05/17/2018   w/ perforation  s/p  sigmoid colectomy  . History of kidney stones   . Hyperlipemia   . Hypertension     Past Surgical History:  Procedure Laterality Date  . ACHILLES TENDON REPAIR Left 1974   ruptured  . bilateral cataract surgery     . COLON RESECTION N/A 05/17/2018   Procedure: EXPLORATORY LAPAROTOMY SIGMOID COLECTOMY AND  COLOSTOMY;  Surgeon: Darnell Level, MD;  Location: [facility];  Service: General;  Laterality: N/A;  . COLONOSCOPY    . COLOSTOMY    . COLOSTOMY TAKEDOWN N/A 10/25/2018   Procedure: LAPAROSCOPIC COLOSTOMY REVERSAL;  Surgeon: [name], MD;  Location: [facility];  Service: General;  Laterality: N/A;  . KIDNEY STONE SURGERY    . KNEE ARTHROSCOPY Right   . SHOULDER ARTHROSCOPY W/ ROTATOR CUFF REPAIR Right 12-24-2010    dr sypher @[facility]   . SHOULDER ARTHROSCOPY W/ SUBACROMIAL DECOMPRESSION AND DISTAL  CLAVICLE EXCISION Left 03-28-2012   dr sypher  @[facility]    debridement  . TRIGGER FINGER RELEASE  11/17/2011   Procedure: RELEASE TRIGGER FINGER/A-1 PULLEY;  Surgeon: Wyn Forsterobert V Sypher Jr., MD;  Location: [facility];  Service: Orthopedics;  Laterality: Left;  RELEASE LEFT LONG TRIGGER FINGER  . TUMOR REMOVAL     rt neck/chin    MEDICATIONS: . albuterol (PROVENTIL HFA;VENTOLIN HFA) 108 (90 BASE) MCG/ACT inhaler  . allopurinol (ZYLOPRIM) 100 MG tablet  . allopurinol (ZYLOPRIM) 300 MG tablet  . Ascorbic Acid (VITAMIN C) 1000 MG tablet  . CINNAMON PO  . fenofibrate 160 MG tablet  .  lisinopril (PRINIVIL,ZESTRIL) 40 MG tablet  . Multiple Vitamin (MULTIVITAMIN WITH MINERALS) TABS tablet  . polyvinyl alcohol (ARTIFICIAL TEARS) 1.4 % ophthalmic solution  . psyllium (METAMUCIL) 58.6 % packet  . rosuvastatin (CRESTOR) 40 MG tablet   No current facility-administered medications for this encounter.     Janey GentaJessica Aleeya Veitch, PA-C [facility] [phone] 04/10/19 3:37 PM

## 2019-04-10 NOTE — H&P (Signed)
General Surgery [facility]  Rosana FretLuis E Bastin Documented: 04/03/2019 3:10 PM Location: [facility] Patient #: [id number] DOB: 09-21-1951 Single / Language: Lenox Ponds[place] / Race: Undefined Male   History of Present Illness [name](Omario Ander M. Khrystian Schauf MD; 04/03/2019 3:41 PM) The patient is a 68 year old male who presents with an incisional hernia.  CHIEF COMPLAINT: incisional hernia  Patient returns to my practice for evaluation of incisional hernia. Patient had undergone urgent laparotomy and Hartman's resection in June 2019. He subsequently underwent laparoscopic colostomy closure in late November 2019. Patient states that approximately 1 month following the procedure he developed a bulge at the site of his colostomy. This is gradually increased in size. It causes minor discomfort. It is always been reducible. He is had no signs or symptoms of obstruction. He presents today for evaluation.   Problem List/Past Medical [name](Tamlyn Sides M. 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 Monasia Lair, MD; 04/03/2019 3:45 PM) Knee Surgery  Right. Resection of Small Bowel  Shoulder Surgery  Bilateral.  Diagnostic Studies History [name](Malary Aylesworth M. Kayman Snuffer, MD; 04/03/2019 3:45 PM) Colonoscopy  1-5 years ago  Allergies [name](Alexza Norbeck M. Josedejesus Marcum, MD; 04/03/2019 3:45 PM) No Known Allergies [06/04/2018]:  Medication History [name](Jabin Tapp M. Bennet Kujawa, MD; 04/03/2019 3:45 PM) Medications Reconciled Albuterol (90MCG/ACT Aerosol Soln, Inhalation) Active. Fenofibrate (160MG  Tablet, Oral) Active. Lisinopril (40MG  Tablet, Oral) Active. Allopurinol (100MG  Tablet, 400 mg Oral) Active.  Social History [name](Donielle Kaigler M. Sahmya Arai, MD; 04/03/2019 3:45 PM) Caffeine use  Coffee. No drug use   Tobacco use  Never smoker.  Family History [name](Juniel Groene M. Ronya Gilcrest, MD; 04/03/2019 3:45 PM) Family history unknown  First Degree Relatives   Other Problems [name](Kenady Doxtater M. Gabreal Worton, MD; 04/03/2019 3:45 PM) Asthma  Umbilical Hernia Repair   Vitals (Alisha Spillers CMA; 04/03/2019 3:11 PM) 04/03/2019 3:10 PM Weight: 192 lb Height: 65in Body Surface Area: 1.94 m Body Mass Index: 31.95 kg/m  Temp.: 98.68F(Oral)  BP: 144/82 (Sitting, Left Arm, Standard)       Physical Exam [name](Jesselyn Rask M. Eliyas Suddreth MD; 04/03/2019 3:42 PM) The physical exam findings are as follows: Note:See vital signs recorded above  GENERAL APPEARANCE Development: normal Nutritional status: normal Gross deformities: none  SKIN Rash, lesions, ulcers: none Induration, erythema: none Nodules: none palpable  EYES Conjunctiva and lids: normal Pupils: equal and reactive Iris: normal bilaterally  EARS, NOSE, MOUTH, THROAT External ears: no lesion or deformity External nose: no lesion or deformity Hearing: grossly normal Lips: no lesion or deformity Dentition: normal for age Oral mucosa: moist  NECK Symmetric: yes Trachea: midline Thyroid: no palpable nodules in the thyroid bed  CHEST Respiratory effort: normal Retraction or accessory muscle use: no Breath sounds: normal bilaterally Rales, rhonchi, wheeze: none  CARDIOVASCULAR Auscultation: regular rhythm, normal rate Murmurs: none Pulses: carotid and radial pulse 2+ palpable Lower extremity edema: none Lower extremity varicosities: none  ABDOMEN Distension: none Masses: none palpable Tenderness: none Hepatosplenomegaly: not present Hernia: Obvious bulge at previous colostomy site, augments with coughing Valsalva, reducible, spontaneously prolapses. Fascial defect feels approximately 3 cm in diameter on palpation. Nontender.  MUSCULOSKELETAL Station and gait: normal Digits and nails: no clubbing or cyanosis Muscle strength: grossly normal all  extremities Range of motion: grossly normal all extremities Deformity: none  LYMPHATIC Cervical: none palpable Supraclavicular: none palpable  PSYCHIATRIC Oriented to  person, place, and time: yes Mood and affect: normal for situation Judgment and insight: appropriate for situation    Assessment & Plan Roger Heckler MD; 04/03/2019 3:45 PM) INCISIONAL HERNIA, WITHOUT OBSTRUCTION OR GANGRENE (K43.2) Current Plans Patient is known to my practice from his prior surgical procedures. He underwent laparoscopic colostomy takedown by my partner, Dr. [name], in November 2019. Patient has now developed an incisional hernia at his colostomy site. He presents today for evaluation for repair.  On examination, the patient does indeed have a incisional hernia at the site of his previous colostomy. We discussed options for surgical management including laparoscopic repair versus open repair and using prosthetic mesh. Patient prefers an open procedure. We discussed the risk and benefits. We discussed an overnight hospital stay. We discussed restrictions on his activities after the surgery. Patient understands and wishes to proceed with surgery. At this time the operating room schedule is limited due to the virus pandemic. We will complete an order set and put the patient in line for scheduling as operating room time allows.  In the interim, I will give the patient a prescription for an abdominal binder which will also provide support and hopefully comfort.  The risks and benefits of the procedure have been discussed at length with the patient. The patient understands the proposed procedure, potential alternative treatments, and the course of recovery to be expected. All of the patient's questions have been answered at this time. The patient wishes to proceed with surgery.    Darnell Level, MD [facility] Office: [phone]

## 2019-04-11 LAB — NOVEL CORONAVIRUS, NAA (HOSP ORDER, SEND-OUT TO REF LAB; TAT 18-24 HRS): SARS-CoV-2, NAA: NOT DETECTED

## 2019-04-11 NOTE — Progress Notes (Signed)
SPOKE W/  Patient via phone     SCREENING SYMPTOMS OF COVID 19:   COUGH--no  RUNNY NOSE--- no  SORE THROAT---no  NASAL CONGESTION----no  SNEEZING----no  SHORTNESS OF BREATH---no  DIFFICULTY BREATHING---no  TEMP >100.0 -----no  UNEXPLAINED BODY ACHES------no  CHILLS -------- no  HEADACHES ---------no  LOSS OF SMELL/ TASTE --------no    HAVE YOU OR ANY FAMILY MEMBER TRAVELLED PAST 14 DAYS OUT OF THE   COUNTY---no STATE----no COUNTRY----no  HAVE YOU OR ANY FAMILY MEMBER BEEN EXPOSED TO ANYONE WITH COVID 19? no     

## 2019-04-14 ENCOUNTER — Ambulatory Visit (HOSPITAL_COMMUNITY): Payer: Medicare Other | Admitting: Physician Assistant

## 2019-04-14 ENCOUNTER — Encounter (HOSPITAL_COMMUNITY)
Admission: RE | Disposition: A | Discharge status: Home / Self Care | Payer: Self-pay | Source: Home / Self Care | Attending: Surgery

## 2019-04-14 ENCOUNTER — Encounter (HOSPITAL_COMMUNITY): Payer: Self-pay | Admitting: Certified Registered Nurse Anesthetist

## 2019-04-14 ENCOUNTER — Observation Stay (HOSPITAL_COMMUNITY)
Admission: RE | Admit: 2019-04-14 | Discharge: 2019-04-15 | Disposition: A | Discharge status: Home / Self Care | Payer: Medicare Other | Attending: Surgery | Admitting: Surgery

## 2019-04-14 ENCOUNTER — Other Ambulatory Visit: Payer: Self-pay

## 2019-04-14 DIAGNOSIS — J45909 Unspecified asthma, uncomplicated: Secondary | ICD-10-CM | POA: Diagnosis not present

## 2019-04-14 DIAGNOSIS — Z6832 Body mass index (BMI) 32.0-32.9, adult: Secondary | ICD-10-CM | POA: Insufficient documentation

## 2019-04-14 DIAGNOSIS — K432 Incisional hernia without obstruction or gangrene: Principal | ICD-10-CM | POA: Insufficient documentation

## 2019-04-14 DIAGNOSIS — E669 Obesity, unspecified: Secondary | ICD-10-CM | POA: Diagnosis not present

## 2019-04-14 HISTORY — PX: INCISIONAL HERNIA REPAIR: SHX193

## 2019-04-14 LAB — GLUCOSE, CAPILLARY: Glucose-Capillary: 125 mg/dL — ABNORMAL HIGH (ref 70–99)

## 2019-04-14 SURGERY — REPAIR, HERNIA, INCISIONAL
Anesthesia: General | Site: Abdomen

## 2019-04-14 MED ORDER — GABAPENTIN 300 MG PO CAPS
300.0000 mg | ORAL_CAPSULE | Freq: Once | ORAL | Status: AC
  Administered 2019-04-14: 300 mg via ORAL
  Filled 2019-04-14: qty 1

## 2019-04-14 MED ORDER — PROPOFOL 10 MG/ML IV BOLUS
INTRAVENOUS | Status: DC | PRN
  Administered 2019-04-14: 150 mg via INTRAVENOUS

## 2019-04-14 MED ORDER — ALBUTEROL SULFATE (2.5 MG/3ML) 0.083% IN NEBU: 2.5000 mg | INHALATION_SOLUTION | Freq: Four times a day (QID) | RESPIRATORY_TRACT | Status: DC | PRN

## 2019-04-14 MED ORDER — ALBUTEROL SULFATE HFA 108 (90 BASE) MCG/ACT IN AERS: 2.0000 | INHALATION_SPRAY | Freq: Four times a day (QID) | RESPIRATORY_TRACT | Status: DC | PRN

## 2019-04-14 MED ORDER — ACETAMINOPHEN 325 MG PO TABS
650.0000 mg | ORAL_TABLET | Freq: Four times a day (QID) | ORAL | Status: DC | PRN
  Administered 2019-04-14: 650 mg via ORAL
  Filled 2019-04-14: qty 2

## 2019-04-14 MED ORDER — MIDAZOLAM HCL 5 MG/5ML IJ SOLN
INTRAMUSCULAR | Status: DC | PRN
  Administered 2019-04-14: 2 mg via INTRAVENOUS

## 2019-04-14 MED ORDER — PROPOFOL 10 MG/ML IV BOLUS
INTRAVENOUS | Status: AC
  Filled 2019-04-14: qty 20

## 2019-04-14 MED ORDER — CEFAZOLIN SODIUM-DEXTROSE 2-4 GM/100ML-% IV SOLN
2.0000 g | INTRAVENOUS | Status: AC
  Administered 2019-04-14: 2 g via INTRAVENOUS
  Filled 2019-04-14: qty 100

## 2019-04-14 MED ORDER — EPHEDRINE SULFATE-NACL 50-0.9 MG/10ML-% IV SOSY
PREFILLED_SYRINGE | INTRAVENOUS | Status: DC | PRN
  Administered 2019-04-14 (×3): 5 mg via INTRAVENOUS

## 2019-04-14 MED ORDER — ALLOPURINOL 300 MG PO TABS: 300.0000 mg | ORAL_TABLET | Freq: Every day | ORAL | Status: DC

## 2019-04-14 MED ORDER — MIDAZOLAM HCL 2 MG/2ML IJ SOLN
INTRAMUSCULAR | Status: AC
  Filled 2019-04-14: qty 2

## 2019-04-14 MED ORDER — HYDROMORPHONE HCL 1 MG/ML IJ SOLN
0.2500 mg | INTRAMUSCULAR | Status: DC | PRN
  Administered 2019-04-14 (×2): 0.5 mg via INTRAVENOUS

## 2019-04-14 MED ORDER — FENTANYL CITRATE (PF) 100 MCG/2ML IJ SOLN
INTRAMUSCULAR | Status: DC | PRN
  Administered 2019-04-14: 50 ug via INTRAVENOUS
  Administered 2019-04-14: 100 ug via INTRAVENOUS
  Administered 2019-04-14 (×2): 50 ug via INTRAVENOUS

## 2019-04-14 MED ORDER — CHLORHEXIDINE GLUCONATE CLOTH 2 % EX PADS: 6.0000 | MEDICATED_PAD | Freq: Once | CUTANEOUS | Status: DC

## 2019-04-14 MED ORDER — LIDOCAINE 2% (20 MG/ML) 5 ML SYRINGE
INTRAMUSCULAR | Status: DC | PRN
  Administered 2019-04-14: 100 mg via INTRAVENOUS

## 2019-04-14 MED ORDER — LIDOCAINE 2% (20 MG/ML) 5 ML SYRINGE
INTRAMUSCULAR | Status: AC
  Filled 2019-04-14: qty 5

## 2019-04-14 MED ORDER — SUCCINYLCHOLINE CHLORIDE 200 MG/10ML IV SOSY
PREFILLED_SYRINGE | INTRAVENOUS | Status: AC
  Filled 2019-04-14: qty 10

## 2019-04-14 MED ORDER — ONDANSETRON HCL 4 MG/2ML IJ SOLN: 4.0000 mg | Freq: Four times a day (QID) | INTRAMUSCULAR | Status: DC | PRN

## 2019-04-14 MED ORDER — EPHEDRINE 5 MG/ML INJ
INTRAVENOUS | Status: AC
  Filled 2019-04-14: qty 10

## 2019-04-14 MED ORDER — ACETAMINOPHEN 650 MG RE SUPP: 650.0000 mg | Freq: Four times a day (QID) | RECTAL | Status: DC | PRN

## 2019-04-14 MED ORDER — LISINOPRIL 20 MG PO TABS
40.0000 mg | ORAL_TABLET | Freq: Every day | ORAL | Status: DC
  Administered 2019-04-14: 40 mg via ORAL
  Filled 2019-04-14: qty 2

## 2019-04-14 MED ORDER — DEXAMETHASONE SODIUM PHOSPHATE 10 MG/ML IJ SOLN
INTRAMUSCULAR | Status: DC | PRN
  Administered 2019-04-14: 8 mg via INTRAVENOUS

## 2019-04-14 MED ORDER — 0.9 % SODIUM CHLORIDE (POUR BTL) OPTIME
TOPICAL | Status: DC | PRN
  Administered 2019-04-14: 1000 mL

## 2019-04-14 MED ORDER — KETOROLAC TROMETHAMINE 30 MG/ML IJ SOLN
INTRAMUSCULAR | Status: AC
  Administered 2019-04-14: 30 mg via INTRAVENOUS
  Filled 2019-04-14: qty 1

## 2019-04-14 MED ORDER — SUGAMMADEX SODIUM 200 MG/2ML IV SOLN
INTRAVENOUS | Status: DC | PRN
  Administered 2019-04-14: 200 mg via INTRAVENOUS

## 2019-04-14 MED ORDER — HYDROMORPHONE HCL 1 MG/ML IJ SOLN: 1.0000 mg | INTRAMUSCULAR | Status: DC | PRN

## 2019-04-14 MED ORDER — TRAMADOL HCL 50 MG PO TABS
50.0000 mg | ORAL_TABLET | Freq: Four times a day (QID) | ORAL | Status: DC | PRN
  Filled 2019-04-14: qty 1

## 2019-04-14 MED ORDER — SUGAMMADEX SODIUM 200 MG/2ML IV SOLN
INTRAVENOUS | Status: AC
  Filled 2019-04-14: qty 2

## 2019-04-14 MED ORDER — FENOFIBRATE 160 MG PO TABS
160.0000 mg | ORAL_TABLET | Freq: Every day | ORAL | Status: DC
  Administered 2019-04-14: 22:00:00 160 mg via ORAL
  Filled 2019-04-14: qty 1

## 2019-04-14 MED ORDER — OXYCODONE HCL 5 MG/5ML PO SOLN: 5.0000 mg | Freq: Once | ORAL | Status: DC | PRN

## 2019-04-14 MED ORDER — FENTANYL CITRATE (PF) 100 MCG/2ML IJ SOLN
INTRAMUSCULAR | Status: AC
  Administered 2019-04-14: 11:00:00 50 ug via INTRAVENOUS
  Filled 2019-04-14: qty 2

## 2019-04-14 MED ORDER — HYDROMORPHONE HCL 1 MG/ML IJ SOLN
INTRAMUSCULAR | Status: AC
  Administered 2019-04-14: 0.5 mg via INTRAVENOUS
  Filled 2019-04-14: qty 1

## 2019-04-14 MED ORDER — LACTATED RINGERS IV SOLN
INTRAVENOUS | Status: DC
  Administered 2019-04-14: 08:00:00 via INTRAVENOUS

## 2019-04-14 MED ORDER — ROCURONIUM BROMIDE 10 MG/ML (PF) SYRINGE
PREFILLED_SYRINGE | INTRAVENOUS | Status: AC
  Filled 2019-04-14: qty 10

## 2019-04-14 MED ORDER — KETOROLAC TROMETHAMINE 30 MG/ML IJ SOLN
30.0000 mg | Freq: Once | INTRAMUSCULAR | Status: AC | PRN
  Administered 2019-04-14: 30 mg via INTRAVENOUS

## 2019-04-14 MED ORDER — SUCCINYLCHOLINE CHLORIDE 200 MG/10ML IV SOSY
PREFILLED_SYRINGE | INTRAVENOUS | Status: DC | PRN
  Administered 2019-04-14: 100 mg via INTRAVENOUS

## 2019-04-14 MED ORDER — ONDANSETRON HCL 4 MG/2ML IJ SOLN: 4.0000 mg | Freq: Once | INTRAMUSCULAR | Status: DC | PRN

## 2019-04-14 MED ORDER — OXYCODONE HCL 5 MG PO TABS
5.0000 mg | ORAL_TABLET | ORAL | Status: DC | PRN
  Administered 2019-04-14 – 2019-04-15 (×2): 10 mg via ORAL
  Filled 2019-04-14 (×2): qty 2

## 2019-04-14 MED ORDER — ACETAMINOPHEN 500 MG PO TABS
1000.0000 mg | ORAL_TABLET | Freq: Once | ORAL | Status: AC
  Administered 2019-04-14: 1000 mg via ORAL
  Filled 2019-04-14: qty 2

## 2019-04-14 MED ORDER — ROCURONIUM BROMIDE 50 MG/5ML IV SOSY
PREFILLED_SYRINGE | INTRAVENOUS | Status: DC | PRN
  Administered 2019-04-14: 40 mg via INTRAVENOUS
  Administered 2019-04-14: 10 mg via INTRAVENOUS

## 2019-04-14 MED ORDER — OXYCODONE HCL 5 MG PO TABS: 5.0000 mg | ORAL_TABLET | Freq: Once | ORAL | Status: DC | PRN

## 2019-04-14 MED ORDER — DEXAMETHASONE SODIUM PHOSPHATE 10 MG/ML IJ SOLN
INTRAMUSCULAR | Status: AC
  Filled 2019-04-14: qty 1

## 2019-04-14 MED ORDER — ONDANSETRON HCL 4 MG/2ML IJ SOLN
INTRAMUSCULAR | Status: DC | PRN
  Administered 2019-04-14: 4 mg via INTRAVENOUS

## 2019-04-14 MED ORDER — FENTANYL CITRATE (PF) 100 MCG/2ML IJ SOLN
25.0000 ug | INTRAMUSCULAR | Status: DC | PRN
  Administered 2019-04-14 (×2): 50 ug via INTRAVENOUS

## 2019-04-14 MED ORDER — ONDANSETRON 4 MG PO TBDP: 4.0000 mg | ORAL_TABLET | Freq: Four times a day (QID) | ORAL | Status: DC | PRN

## 2019-04-14 MED ORDER — ONDANSETRON HCL 4 MG/2ML IJ SOLN
INTRAMUSCULAR | Status: AC
  Filled 2019-04-14: qty 2

## 2019-04-14 MED ORDER — FENTANYL CITRATE (PF) 250 MCG/5ML IJ SOLN
INTRAMUSCULAR | Status: AC
  Filled 2019-04-14: qty 5

## 2019-04-14 MED ORDER — KCL IN DEXTROSE-NACL 20-5-0.45 MEQ/L-%-% IV SOLN
INTRAVENOUS | Status: DC
  Administered 2019-04-14 – 2019-04-15 (×2): via INTRAVENOUS
  Filled 2019-04-14 (×2): qty 1000

## 2019-04-14 SURGICAL SUPPLY — 27 items
BINDER ABDOMINAL 12 ML 46-62 (SOFTGOODS) ×3 IMPLANT
CHLORAPREP W/TINT 26 (MISCELLANEOUS) IMPLANT
COVER SURGICAL LIGHT HANDLE (MISCELLANEOUS) ×3 IMPLANT
COVER WAND RF STERILE (DRAPES) IMPLANT
DERMABOND ADVANCED (GAUZE/BANDAGES/DRESSINGS) ×2
DERMABOND ADVANCED .7 DNX12 (GAUZE/BANDAGES/DRESSINGS) ×1 IMPLANT
DRAPE LAPAROSCOPIC ABDOMINAL (DRAPES) ×3 IMPLANT
ELECT REM PT RETURN 15FT ADLT (MISCELLANEOUS) ×3 IMPLANT
GAUZE SPONGE 4X4 12PLY STRL (GAUZE/BANDAGES/DRESSINGS) ×3 IMPLANT
GLOVE SURG ORTHO 8.0 STRL STRW (GLOVE) ×3 IMPLANT
GOWN STRL REUS W/TWL LRG LVL3 (GOWN DISPOSABLE) ×3 IMPLANT
GOWN STRL REUS W/TWL XL LVL3 (GOWN DISPOSABLE) ×6 IMPLANT
KIT BASIN OR (CUSTOM PROCEDURE TRAY) ×3 IMPLANT
KIT TURNOVER KIT A (KITS) IMPLANT
MARKER SKIN DUAL TIP RULER LAB (MISCELLANEOUS) ×3 IMPLANT
MESH VENTRALEX ST 2.5 CRC MED (Mesh General) ×3 IMPLANT
NS IRRIG 1000ML POUR BTL (IV SOLUTION) IMPLANT
PACK GENERAL/GYN (CUSTOM PROCEDURE TRAY) ×3 IMPLANT
STAPLER VISISTAT 35W (STAPLE) IMPLANT
SUT MNCRL AB 3-0 PS2 18 (SUTURE) ×3 IMPLANT
SUT MNCRL AB 4-0 PS2 18 (SUTURE) ×3 IMPLANT
SUT NOVA 1 T20/GS 25DT (SUTURE) ×6 IMPLANT
SUT NOVA NAB GS-21 0 18 T12 DT (SUTURE) IMPLANT
SUT VIC AB 2-0 CT2 27 (SUTURE) IMPLANT
SUT VIC AB 3-0 SH 18 (SUTURE) ×3 IMPLANT
TOWEL OR 17X26 10 PK STRL BLUE (TOWEL DISPOSABLE) ×3 IMPLANT
TRAY FOLEY MTR SLVR 16FR STAT (SET/KITS/TRAYS/PACK) IMPLANT

## 2019-04-14 NOTE — Anesthesia Procedure Notes (Signed)
Procedure Name: Intubation Date/Time: 04/14/2019 9:05 AM Performed by: Maxwell Caul, CRNA Pre-anesthesia Checklist: Patient identified, Emergency Drugs available, Suction available and Patient being monitored Patient Re-evaluated:Patient Re-evaluated prior to induction Oxygen Delivery Method: Circle system utilized Preoxygenation: Pre-oxygenation with 100% oxygen Induction Type: IV induction and Rapid sequence Laryngoscope Size: Mac and 4 Grade View: Grade I Tube type: Oral Tube size: 7.5 mm Number of attempts: 1 Airway Equipment and Method: Stylet Placement Confirmation: ETT inserted through vocal cords under direct vision,  positive ETCO2 and breath sounds checked- equal and bilateral Secured at: 21 cm Tube secured with: Tape Dental Injury: Teeth and Oropharynx as per pre-operative assessment

## 2019-04-14 NOTE — Anesthesia Postprocedure Evaluation (Signed)
Anesthesia Post Note  Patient: [name]  Procedure(s) Performed: OPEN  REPAIR INCISIONAL HERNIA WITH MESH Patch (N/A Abdomen)     Patient location during evaluation: PACU Anesthesia Type: General Level of consciousness: awake and alert Pain management: pain level controlled Vital Signs Assessment: post-procedure vital signs reviewed and stable Respiratory status: spontaneous breathing, nonlabored ventilation, respiratory function stable and patient connected to nasal cannula oxygen Cardiovascular status: blood pressure returned to baseline and stable Postop Assessment: no apparent nausea or vomiting Anesthetic complications: no    Last Vitals:  Vitals:   04/14/19 1211 04/14/19 1315  BP: (!) 161/74 (!) 151/81  Pulse: 66 72  Resp: 16 18  Temp: (!) 36.3 C 36.9 C  SpO2: 97% 97%    Last Pain:  Vitals:   04/14/19 1315  TempSrc: Oral  PainSc:                  Trevor Iha

## 2019-04-14 NOTE — Transfer of Care (Signed)
Immediate Anesthesia Transfer of Care Note  Patient: [name]  Procedure(s) Performed: OPEN  REPAIR INCISIONAL HERNIA WITH MESH Patch (N/A Abdomen)  Patient Location: PACU  Anesthesia Type:General  Level of Consciousness: awake, alert  and oriented  Airway & Oxygen Therapy: Patient Spontanous Breathing and Patient connected to face mask oxygen  Post-op Assessment: Report given to RN and Post -op Vital signs reviewed and stable  Post vital signs: Reviewed and stable  Last Vitals:  Vitals Value Taken Time  BP 200/110 04/14/2019 10:22 AM  Temp 36.7 C 04/14/2019 10:22 AM  Pulse 61 04/14/2019 10:24 AM  Resp 19 04/14/2019 10:24 AM  SpO2 100 % 04/14/2019 10:24 AM  Vitals shown include unvalidated device data.  Last Pain:  Vitals:   04/14/19 0650  TempSrc: Oral  PainSc: 0-No pain      Patients Stated Pain Goal: 5 (04/14/19 0650)  Complications: No apparent anesthesia complications

## 2019-04-14 NOTE — Op Note (Signed)
Operative Note  Pre-operative Diagnosis: Incisional hernia, reducible  Post-operative Diagnosis: Same  Surgeon:  Darnell Level, MD  Assistant:  Hedda Slade, PA-C   Procedure: Open repair incisional hernia with mesh patch  Anesthesia: General  Estimated Blood Loss: Less than 50 cc  Drains: None         Specimen: None  Indications:  Patient returns to my practice for evaluation of incisional hernia. Patient had undergone urgent laparotomy and Hartman's resection in June 2019. He subsequently underwent laparoscopic colostomy closure in late November 2019. Patient states that approximately 1 month following the procedure he developed a bulge at the site of his colostomy. This is gradually increased in size. It causes minor discomfort. It is always been reducible. He is had no signs or symptoms of obstruction.  Procedure Details:  The patient was seen in the pre-op holding area. The risks, benefits, complications, treatment options, and expected outcomes were previously discussed with the patient. The patient agreed with the proposed plan and has signed the informed consent form.  The patient was brought to the operating room by the surgical team, identified as Elenore Rota and the procedure verified. A "time out" was completed and the above information confirmed.  Following administration of general anesthesia, the patient was prepped and draped in the usual aseptic fashion.  After ascertaining that an adequate level of anesthesia been achieved, the previous scar at the colostomy site is excised with an elliptical incision.  Dissection is carried in subcutaneous tissues.  Hernia sac is opened and the entire hernia sac and overlying skin is excised and discarded.  Fascial plane is developed circumferentially.  Previous suture material is extracted.  Omentum is adherent to the margins of the hernia defect.  These adhesions are lysed with the electrocautery providing for access to the peritoneal  cavity.  Fascia appears healthy and viable.  Defect measures approximately 4 x 2 cm in size.  A medium ventralex ST mesh patch is selected.  It is prepared and inserted beneath the fascia.  It is secured to the peritoneum using #1 Novafil interrupted sutures to close the fascial defect and include the mesh patch anterior surface and the closure.  The defect is closed in this fashion with interrupted #1 Novafil sutures.  Good hemostasis is noted.  Subcutaneous tissues are mobilized allowing for them to be approximated and closed with interrupted 3-0 Vicryl sutures.  Skin is closed with a running 3-0 Monocryl subcuticular suture.  Wound is washed and dried and Dermabond is placed as dressing.  Patient is awakened from anesthesia and transported to the recovery room.  The patient tolerated the procedure well.   Darnell Level, MD [facility] Office: [phone]

## 2019-04-14 NOTE — Interval H&P Note (Signed)
History and Physical Interval Note:  04/14/2019 8:52 AM  [name]  has presented today for surgery, with the diagnosis of INCISIONAL HERNIA.  The various methods of treatment have been discussed with the patient and family. After consideration of risks, benefits and other options for treatment, the patient has consented to  Procedure(s): OPEN  REPAIR INCISIONAL HERNIA WITH MESH (N/A) as a surgical intervention.  The patient's history has been reviewed, patient examined, no change in status, stable for surgery.  I have reviewed the patient's chart and labs.  Questions were answered to the patient's satisfaction.     Darnell Level

## 2019-04-15 ENCOUNTER — Encounter (HOSPITAL_COMMUNITY): Payer: Self-pay | Admitting: Surgery

## 2019-04-15 DIAGNOSIS — K432 Incisional hernia without obstruction or gangrene: Secondary | ICD-10-CM | POA: Diagnosis not present

## 2019-04-15 LAB — GLUCOSE, CAPILLARY: Glucose-Capillary: 110 mg/dL — ABNORMAL HIGH (ref 70–99)

## 2019-04-15 MED ORDER — OXYCODONE HCL 5 MG PO TABS: 5.0000 mg | ORAL_TABLET | ORAL | 0 refills | Status: DC | PRN

## 2019-04-15 NOTE — Progress Notes (Signed)
Pt was discharged home today. Instructions were reviewed with patient, and questions were answered. Pt was taken to main entrance via wheelchair by NT.  

## 2019-04-15 NOTE — Discharge Summary (Signed)
Physician Discharge Summary [facility]  Patient ID: [name] MRN: [id number] DOB/AGE: Jan 13, 1951 68 y.o.  Admit date: 04/14/2019 Discharge date: 04/15/2019  Admission Diagnoses:  Incisional hernia  Discharge Diagnoses:  Principal Problem:   Incisional hernia, without obstruction or gangrene Active Problems:   Ventral incisional hernia   Discharged Condition: good  Hospital Course: Patient was admitted for observation following incisional hernia surgery.  Post op course was uncomplicated.  Pain was well controlled.  Tolerated diet.  Patient was prepared for discharge home on POD#1.  Consults: None  Treatments: surgery: open repair incisional hernia with mesh patch  Discharge Exam: Blood pressure 130/68, pulse (!) 58, temperature 97.9 F (36.6 C), temperature source Oral, resp. rate 17, height 5\' 5"  (1.651 m), weight 87.3 kg, SpO2 100 %. HEENT - clear Neck - soft Chest - clear bilaterally Cor - RRR Abd - up in chair; binder on; dry  Disposition: Home  Discharge Instructions    Diet - low sodium heart healthy   Complete by:  As directed    Discharge instructions   Complete by:  As directed    [facility]  HERNIA REPAIR POST OP INSTRUCTIONS  Always review your discharge instruction sheet given to you by the facility where your surgery was performed.  A  prescription for pain medication may be given to you upon discharge.  Take your pain medication as prescribed.  If narcotic pain medicine is not needed, then you may take acetaminophen (Tylenol) or ibuprofen (Advil) as needed.  Take your usually prescribed medications unless otherwise directed.  If you need a refill on your pain medication, please contact your pharmacy.  They will contact our office to request authorization. Prescriptions will not be filled after 5 pm daily or on weekends.  You should follow a light diet the first 24 hours after arrival home, such as soup and  crackers or toast.  Be sure to include plenty of fluids daily.  Resume your normal diet the day after surgery.  Most patients will experience some swelling and bruising around the surgical site.  Ice packs and reclining will help.  Swelling and bruising can take several days to resolve.   It is common to experience some constipation if taking pain medication after surgery.  Increasing fluid intake and taking a stool softener (such as Colace) will usually help or prevent this problem from occurring.  A mild laxative (Milk of Magnesia or Miralax) should be taken according to package directions if there are no bowel movements after 48 hours.  You will likely have Dermabond (topical glue) over your incisions.  This seals the incisions and allows you to bathe and shower at any time after your surgery.  Glue should remain in place for up to 10 days.  It may be removed after 10 days by pealing off the Dermabond material or using Vaseline or naval jelly to remove.  If you have steri-strips over your incisions, you may remove the gauze bandage on the second day after surgery, and you may shower at that time.  Leave your steri-strips (small skin tapes) in place directly over the incision.  These strips should remain on the skin for 5-7 days and then be removed.  You may get them wet in the shower and pat them dry.  ACTIVITIES:  You may resume regular (light) daily activities beginning the next day - such as daily self-care, walking, climbing stairs - gradually increasing activities as tolerated.  You  may have sexual intercourse when it is comfortable.  Refrain from any heavy lifting or straining until approved by your doctor.  You may drive when you are no longer taking prescription pain medication, when you can comfortably wear a seatbelt, and when you can safely maneuver your car and apply brakes.  You should see your doctor in the office for a follow-up appointment approximately 2-3 weeks after your surgery.   Make sure that you call for this appointment within a day or two after you arrive home to insure a convenient appointment time.  WHEN TO CALL YOUR DOCTOR: Fever greater than 101.0 Inability to urinate Persistent nausea and/or vomiting Extreme swelling or bruising Continued bleeding from incision Increased pain, redness, or drainage from the incision  The clinic staff is available to answer your questions during regular business hours.  Please don't hesitate to call and ask to speak to one of the nurses for clinical concerns.  If you have a medical emergency, go to the nearest emergency room or call 911.  A surgeon from [facility] is always on call for the hospital.   [facility] [address], [address], [place], [place]  [address]  [phone] ? [phone] ? FAX [phone]  [email or web address]   Increase activity slowly   Complete by:  As directed    No dressing needed   Complete by:  As directed      Allergies as of 04/15/2019      Reactions   Hibiclens [chlorhexidine Gluconate] Rash, Other (See Comments)   Pt. Had rash on torso after last surgery, mostly on surgical side.  Am listing CHG as potential allergy. Preop 04/14/19 tolerated CHG wipe without problem.   Ciprofloxacin Rash   Took both cipro and metronidazole together, unsure which caused the rash   Flagyl [metronidazole] Rash   Took both cipro and metronidazole together, unsure which caused the rash      Medication List    TAKE these medications   albuterol 108 (90 Base) MCG/ACT inhaler Commonly known as:  VENTOLIN HFA Inhale 2 puffs into the lungs every 6 (six) hours as needed for wheezing.   allopurinol 300 MG tablet Commonly known as:  ZYLOPRIM Take 300 mg by mouth daily.   allopurinol 100 MG tablet Commonly known as:  ZYLOPRIM Take 100 mg by mouth daily.   Artificial Tears 1.4 % ophthalmic solution Generic drug:  polyvinyl alcohol Place 1 drop  into both eyes 2 (two) times a day.   CINNAMON PO Take 1,000 mg by mouth daily.   fenofibrate 160 MG tablet Take 160 mg by mouth at bedtime.   lisinopril 40 MG tablet Commonly known as:  ZESTRIL Take 40 mg by mouth daily.   multivitamin with minerals Tabs tablet Take 1 tablet by mouth daily.   oxyCODONE 5 MG immediate release tablet Commonly known as:  Oxy IR/ROXICODONE Take 1-2 tablets (5-10 mg total) by mouth every 4 (four) hours as needed for moderate pain.   psyllium 58.6 % packet Commonly known as:  METAMUCIL Take 1 packet by mouth at bedtime.   rosuvastatin 40 MG tablet Commonly known as:  CRESTOR Take 20 mg by mouth at bedtime.   vitamin C 1000 MG tablet Take 1,000 mg by mouth daily.        Velora Heckler, MD, [facility] Office: [phone]   Signed: Darnell Level 04/15/2019, 8:18 AM

## 2019-10-02 ENCOUNTER — Other Ambulatory Visit: Payer: Self-pay

## 2019-10-02 ENCOUNTER — Ambulatory Visit: Payer: Medicare Other | Attending: Specialist

## 2019-10-02 DIAGNOSIS — G8929 Other chronic pain: Secondary | ICD-10-CM | POA: Diagnosis present

## 2019-10-02 DIAGNOSIS — M25512 Pain in left shoulder: Secondary | ICD-10-CM | POA: Diagnosis present

## 2019-10-02 DIAGNOSIS — R293 Abnormal posture: Secondary | ICD-10-CM | POA: Diagnosis present

## 2019-10-02 DIAGNOSIS — M79602 Pain in left arm: Secondary | ICD-10-CM | POA: Diagnosis present

## 2019-10-02 NOTE — Therapy (Signed)
[facility] [address] [place], [place], [address] Phone: [phone]   Fax:  [phone]  Physical Therapy Treatment  Patient Details  Name: [name] MRN: [id number] Date of Birth: 1951-01-26 Referring Provider (PT): Dr. Thomasena Edis   Encounter Date: 10/02/2019  PT End of Session - 10/02/19 1343    Visit Number  1    Number of Visits  13    Date for PT Re-Evaluation  11/20/19    PT Start Time  1343    PT Stop Time  1430    PT Time Calculation (min)  47 min    Activity Tolerance  Patient tolerated treatment well    Behavior During Therapy  [facility] for tasks assessed/performed       Past Medical History:  Diagnosis Date  . Asthma   . Back pain   . Diabetes mellitus    diet controlled   . Diverticular disease   . Environmental allergies   . History of diverticulitis of colon 05/17/2018   w/ perforation  s/p  sigmoid colectomy  . History of kidney stones   . Hyperlipemia   . Hypertension     Past Surgical History:  Procedure Laterality Date  . ACHILLES TENDON REPAIR Left 1974   ruptured  . bilateral cataract surgery     . COLON RESECTION N/A 05/17/2018   Procedure: EXPLORATORY LAPAROTOMY SIGMOID COLECTOMY AND  COLOSTOMY;  Surgeon: Darnell Level, MD;  Location: [facility];  Service: General;  Laterality: N/A;  . COLONOSCOPY    . COLOSTOMY    . COLOSTOMY TAKEDOWN N/A 10/25/2018   Procedure: LAPAROSCOPIC COLOSTOMY REVERSAL;  Surgeon: [name], MD;  Location: [facility];  Service: General;  Laterality: N/A;  . INCISIONAL HERNIA REPAIR N/A 04/14/2019   Procedure: OPEN  REPAIR INCISIONAL HERNIA WITH MESH Patch;  Surgeon: Darnell Level, MD;  Location: [facility];  Service: General;  Laterality: N/A;  . KIDNEY STONE SURGERY    . KNEE ARTHROSCOPY Right   . SHOULDER ARTHROSCOPY W/ ROTATOR CUFF REPAIR Right 12-24-2010    dr sypher   . SHOULDER ARTHROSCOPY W/ SUBACROMIAL DECOMPRESSION AND DISTAL CLAVICLE EXCISION Left  03-28-2012   dr sypher     debridement  . TRIGGER FINGER RELEASE  11/17/2011   Procedure: RELEASE TRIGGER FINGER/A-1 PULLEY;  Surgeon: Wyn Forster., MD;  Location: [facility];  Service: Orthopedics;  Laterality: Left;  RELEASE LEFT LONG TRIGGER FINGER  . TUMOR REMOVAL     rt neck/chin    There were no vitals filed for this visit.  Subjective Assessment - 10/02/19 1343    Subjective  Pt reports that he had a RC surgery done on the R with open incision and on the L arthroscopically in 2018. He states that his bicep has been bothering him since that time on the L. He states that he had an MRI done with findings of medial subluxation of the biceps tendon (per MD VM pt played for physical therapist). Dr. Cheree Ditto stated that the [facility] looked great. Pt states that he also had a trigger finger on his R thumb which is doing much better.    Pertinent History  2018 history of RC repair Bilaterally    Diagnostic tests  MRI with medial subluxation of the L biceps tendon per Dr. Cheree Ditto at [facility] per VM pt had on his phone.    Patient Stated Goals  I want to lessen the pain in my arm especially  when I am dancing.    Currently in Pain?  Yes    Pain Score  6     Pain Location  Shoulder    Pain Orientation  Left    Pain Descriptors / Indicators  Aching;Sharp    Pain Type  Chronic pain    Pain Radiating Towards  radiates along the L biceps    Pain Onset  More than a month ago    Pain Frequency  Intermittent    Aggravating Factors   Lifting his arm into ER/abduction especially aggravates the pain    Pain Relieving Factors  Rest    Effect of Pain on Daily Activities  Pt has to accomodate his pain.    Multiple Pain Sites  No         [facility] PT Assessment - 10/02/19 0001      Assessment   Medical Diagnosis  L shoulder pain    Referring Provider (PT)  Dr. Thomasena Edisollins    Hand Dominance  Right    Prior Therapy  Not for this issue      Balance Screen   Has the patient fallen in  the past 6 months  No    Has the patient had a decrease in activity level because of a fear of falling?   No    Is the patient reluctant to leave their home because of a fear of falling?   No      Home Environment   Living Environment  Private residence    Living Arrangements  Children    Type of Home  House      Prior Function   Level of Independence  Independent    Vocation  Retired    Leisure  Dancing (shagging), Table tennis      Cognition   Overall Cognitive Status  Within Functional Limits for tasks assessed      Posture/Postural Control   Posture/Postural Control  Postural limitations    Postural Limitations  Rounded Shoulders;Forward head      ROM / Strength   AROM / PROM / Strength  Strength;AROM      AROM   AROM Assessment Site  Shoulder    Right/Left Shoulder  Right;Left    Right Shoulder Flexion  150 Degrees    Right Shoulder ABduction  158 Degrees    Right Shoulder Internal Rotation  53 Degrees    Right Shoulder External Rotation  80 Degrees    Left Shoulder Flexion  144 Degrees    Left Shoulder ABduction  157 Degrees    Left Shoulder Internal Rotation  55 Degrees    Left Shoulder External Rotation  90 Degrees      Strength   Strength Assessment Site  Shoulder;Elbow    Right/Left Shoulder  Right;Left    Right Shoulder Flexion  4+/5    Right Shoulder ABduction  4+/5    Right Shoulder Internal Rotation  4+/5    Right Shoulder External Rotation  4+/5    Left Shoulder Flexion  4+/5    Left Shoulder ABduction  4+/5    Left Shoulder Internal Rotation  4/5    Left Shoulder External Rotation  4-/5    Right/Left Elbow  Right;Left    Right Elbow Flexion  5/5    Right Elbow Extension  4+/5    Left Elbow Flexion  4/5    Left Elbow Extension  4/5      Palpation   Palpation comment  cording noted in the L  axilla 1 cord that goes into the anteroir antebrachium.                    [facility] Adult PT Treatment/Exercise - 10/02/19 0001      Exercises    Exercises  Shoulder;Elbow      Elbow Exercises   Elbow Flexion  AROM;10 reps;Left    Bar Weights/Barbell (Elbow Flexion)  1 lb    Elbow Flexion Limitations  Eccentric lower for inflamation at the long head of the biceps tendon w/VC for slow movement      Shoulder Exercises: Seated   Retraction  AROM;Both;10 reps    Retraction Limitations  VC for correct movement and for demonstration. Explanation for how much movement was available due to slumped posture and how this is important to perform during rowing activity at home to decrease arm load and increase strength in scapular musculature.       Shoulder Exercises: Standing   Other Standing Exercises  biceps stretch facing wall with wrist extension 20 seconds, Bicep stretch on the wall with fingers pointing posterior and pectoralis major stretch 1x 20 seconds each VC and demonstration for correct movement.              PT Education - 10/02/19 1527    Education Details  Access Code: [email or web address], Pt was educated on cording including possible causes of cording such as excessive strain, surgery, lymph injury, Discussed treatment for cording including non-aggressive stretching and STM. Pt was educated on correct posture and the importance of activating scapular musculature during rows that he stated he does at home to decrease anterior roll of the shoulders which increases stress on the biceps tendon. Pt was educated on current research regarding stretching and eccentric exercise such as the use of eccentric to decrease tendonitis and the importance of only light stretching to decrease risk for further injury.    Person(s) Educated  Patient    Methods  Explanation;Demonstration;Tactile cues;Verbal cues;Handout    Comprehension  Verbalized understanding;Returned demonstration       PT Short Term Goals - 10/02/19 1537      PT SHORT TERM GOAL #1   Title  Pt will be independent with initial HEP within 3 weeks for autonomy of care.    Baseline  Pt  does not have an HEP    Time  3    Period  Weeks    Status  New    Target Date  10/30/19      PT SHORT TERM GOAL #2   Title  Pt will demonstrate 150 degrees of L shoulder flexion w/decreased visible cording within 3 weeks in order to demonstrate improved functional ROM and decreased limitations related to cording.    Baseline  Pt has cording present in the LUE from axilla to antebrachium and 143 degrees L shouler flexion    Time  3    Period  Weeks    Status  New    Target Date  10/30/19        PT Long Term Goals - 10/02/19 1539      PT LONG TERM GOAL #1   Title  Pt will report 2/10 pain in the L biceps when dancing per pt report to demonstrate an improved quality of life in order to decrease risk for immobility.    Baseline  5/10 pain    Time  6    Period  Weeks    Status  New    Target Date  11/20/19      PT LONG TERM GOAL #2   Title  Pt will demonstrate 4+/5 L shoulder IR/ER within 6 weeks to demonstrate improved functional strength.    Baseline  4-/5 ER, 4/5 IR    Time  6    Period  Weeks    Status  New    Target Date  11/20/19      PT LONG TERM GOAL #3   Title  Pt will demonstrate improved posture within 6 weeks w/VC w/o tactile cueing for correct posture to demonstrate improved proprioception of the trunk/cervical spine.    Baseline  significant fwd head posture and anteriorly rolled shoulders.    Time  6    Period  Weeks    Status  New    Target Date  11/20/19            Plan - 10/02/19 1529    Clinical Impression Statement  Pt presents to physical therapy with slight decrease in ROM and strength in the R biceps/shoulder compared to the L. He is experiencing significant pain at the the long head of the L biceps tendon on palpation and with MMT of elbow flexion. Pt has cording present in the LUE form the L axilla to the anterior L mid-antebrachium that is most likely contributing to aggravation at the L shoulder. Pt has significant postural deficits with fwd  head, anterior rolled shoulder that increase the stress on anterior Bil shoulder. Pt will benefit from skilled physical therapy services in order to address the above limitations.    Personal Factors and Comorbidities  Behavior Pattern;Comorbidity 3+    Comorbidities  HTN, DM II, Bil shoulder surgery    Examination-Activity Limitations  Lift;Carry    Stability/Clinical Decision Making  Stable/Uncomplicated    Clinical Decision Making  Low    Rehab Potential  Excellent    PT Frequency  2x / week    PT Duration  6 weeks    PT Treatment/Interventions  Cryotherapy;Electrical Stimulation;Iontophoresis 4mg /ml Dexamethasone;Moist Heat;Functional mobility training;Therapeutic activities;Therapeutic exercise;Neuromuscular re-education;Patient/family education;Manual techniques    PT Next Visit Plan  Continue with eccentric exercise of the biceps, STM/IASTM to cording on the LUE       Patient will benefit from skilled therapeutic intervention in order to improve the following deficits and impairments:  Decreased range of motion, Decreased strength, Postural dysfunction, Pain  Visit Diagnosis: Chronic left shoulder pain  Pain in left arm  Abnormal posture     Problem List Patient Active Problem List   Diagnosis Date Noted  . Ventral incisional hernia 04/14/2019  . Incisional hernia, without obstruction or gangrene 04/10/2019  . Pneumoperitoneum 05/17/2018  . Diverticulitis of colon with perforation s/p colectomy/ostomy 05/17/2018 05/17/2018  . S/P exploratory laparotomy 05/17/2018  . Diverticular disease   . Hypertension   . Hyperlipemia     Ander Purpura, PT 10/02/2019, 3:42 PM  [place], [place], [address] Phone: [phone]   Fax:  [phone]  Name: [name] MRN: [id number] Date of Birth: [birth year]

## 2019-10-02 NOTE — Patient Instructions (Signed)
Access Code: [email or web address]  URL: [email or web address]  Date: 10/02/2019  Prepared by: Tomma Rakers   Exercises Seated Scapular Retraction - 10 reps - 1 sets - 3x daily - 7x weekly Standing Bicep Stretch at [place] - 2 reps - 1 sets - 20 seconds hold - 3x daily - 7x weekly Wrist Extension Stretch at [place] - 2 reps - 1 sets - 20 seconds hold                            - 3x daily - 7x weekly Standing Single Arm Bicep Curls Supinated with Dumbbell - 10 reps - 1 sets - 1 lb weight for now focusing on slowly going down, Just bring your elbow up to 90 degrees. hold - 3x daily - 7x weekly

## 2019-10-07 NOTE — Addendum Note (Signed)
Addended by: Sumner Boast on: 10/07/2019 01:06 PM   Modules accepted: Orders

## 2019-10-08 ENCOUNTER — Other Ambulatory Visit: Payer: Self-pay

## 2019-10-08 ENCOUNTER — Ambulatory Visit: Payer: Medicare Other | Attending: Specialist | Admitting: Physical Therapy

## 2019-10-08 ENCOUNTER — Encounter: Payer: Self-pay | Admitting: Physical Therapy

## 2019-10-08 DIAGNOSIS — M25512 Pain in left shoulder: Secondary | ICD-10-CM | POA: Diagnosis present

## 2019-10-08 DIAGNOSIS — G8929 Other chronic pain: Secondary | ICD-10-CM | POA: Insufficient documentation

## 2019-10-08 DIAGNOSIS — M79602 Pain in left arm: Secondary | ICD-10-CM | POA: Diagnosis present

## 2019-10-08 DIAGNOSIS — M25511 Pain in right shoulder: Secondary | ICD-10-CM | POA: Diagnosis present

## 2019-10-08 DIAGNOSIS — R293 Abnormal posture: Secondary | ICD-10-CM | POA: Insufficient documentation

## 2019-10-08 NOTE — Therapy (Signed)
[facility] [address] [place], [place], [address] Phone: [phone]   Fax:  [phone]  Physical Therapy Treatment  Patient Details  Name: [name] MRN: [id number] Date of Birth: [birth year] Referring Provider (PT): Dr. Thomasena Edis   Encounter Date: 10/08/2019  PT End of Session - 10/08/19 1105    Visit Number  2    Date for PT Re-Evaluation  11/20/19    PT Start Time  1013    PT Stop Time  1100    PT Time Calculation (min)  47 min    Activity Tolerance  Patient tolerated treatment well    Behavior During Therapy  [facility] for tasks assessed/performed       Past Medical History:  Diagnosis Date  . Asthma   . Back pain   . Diabetes mellitus    diet controlled   . Diverticular disease   . Environmental allergies   . History of diverticulitis of colon 05/17/2018   w/ perforation  s/p  sigmoid colectomy  . History of kidney stones   . Hyperlipemia   . Hypertension     Past Surgical History:  Procedure Laterality Date  . ACHILLES TENDON REPAIR Left 1974   ruptured  . bilateral cataract surgery     . COLON RESECTION N/A 05/17/2018   Procedure: EXPLORATORY LAPAROTOMY SIGMOID COLECTOMY AND  COLOSTOMY;  Surgeon: Darnell Level, MD;  Location: [facility];  Service: General;  Laterality: N/A;  . COLONOSCOPY    . COLOSTOMY    . COLOSTOMY TAKEDOWN N/A 10/25/2018   Procedure: LAPAROSCOPIC COLOSTOMY REVERSAL;  Surgeon: [name], MD;  Location: [facility];  Service: General;  Laterality: N/A;  . INCISIONAL HERNIA REPAIR N/A 04/14/2019   Procedure: OPEN  REPAIR INCISIONAL HERNIA WITH MESH Patch;  Surgeon: Darnell Level, MD;  Location: [facility];  Service: General;  Laterality: N/A;  . KIDNEY STONE SURGERY    . KNEE ARTHROSCOPY Right   . SHOULDER ARTHROSCOPY W/ ROTATOR CUFF REPAIR Right 12-24-2010    dr sypher @[facility]   . SHOULDER ARTHROSCOPY W/ SUBACROMIAL DECOMPRESSION AND DISTAL CLAVICLE EXCISION Left 03-28-2012   dr sypher  @[facility]     debridement  . TRIGGER FINGER RELEASE  11/17/2011   Procedure: RELEASE TRIGGER FINGER/A-1 PULLEY;  Surgeon: ., MD;  Location: [facility];  Service: Orthopedics;  Laterality: Left;  RELEASE LEFT LONG TRIGGER FINGER  . TUMOR REMOVAL     rt neck/chin    There were no vitals filed for this visit.  Subjective Assessment - 10/08/19 1011    Subjective  Pt reports compliance with HEP. No change since evaluation    Currently in Pain?  No/denies    Pain Score  0-No pain                       [facility] Adult PT Treatment/Exercise - 10/08/19 0001      Shoulder Exercises: Standing   External Rotation  Strengthening;Theraband;20 reps    Theraband Level (Shoulder External Rotation)  Level 2 (Red)    Extension  Theraband;20 reps;Both;Strengthening    Extension Weight (lbs)  5    Other Standing Exercises  Eccentric biceps curls.with manual resistance 2x5      Shoulder Exercises: ROM/Strengthening   UBE (Upper Arm Bike)  L 2 3 min each way     Other ROM/Strengthening Exercises  Rows & Lats 20lb 2x10       Manual Therapy  Manual Therapy  Passive ROM;Soft tissue mobilization    Soft tissue mobilization  L biceps and deltoid    Passive ROM  to bilateral shoulders                PT Short Term Goals - 10/02/19 1537      PT SHORT TERM GOAL #1   Title  Pt will be independent with initial HEP within 3 weeks for autonomy of care.    Baseline  Pt does not have an HEP    Time  3    Period  Weeks    Status  New    Target Date  10/30/19      PT SHORT TERM GOAL #2   Title  Pt will demonstrate 150 degrees of L shoulder flexion w/decreased visible cording within 3 weeks in order to demonstrate improved functional ROM and decreased limitations related to cording.    Baseline  Pt has cording present in the LUE from axilla to antebrachium and 143 degrees L shouler flexion    Time  3    Period  Weeks    Status  New    Target Date  10/30/19         PT Long Term Goals - 10/02/19 1539      PT LONG TERM GOAL #1   Title  Pt will report 2/10 pain in the L biceps when dancing per pt report to demonstrate an improved quality of life in order to decrease risk for immobility.    Baseline  5/10 pain    Time  6    Period  Weeks    Status  New    Target Date  11/20/19      PT LONG TERM GOAL #2   Title  Pt will demonstrate 4+/5 L shoulder IR/ER within 6 weeks to demonstrate improved functional strength.    Baseline  4-/5 ER, 4/5 IR    Time  6    Period  Weeks    Status  New    Target Date  11/20/19      PT LONG TERM GOAL #3   Title  Pt will demonstrate improved posture within 6 weeks w/VC w/o tactile cueing for correct posture to demonstrate improved proprioception of the trunk/cervical spine.    Baseline  significant fwd head posture and anteriorly rolled shoulders.    Time  6    Period  Weeks    Status  New    Target Date  11/20/19            Plan - 10/08/19 1106    Clinical Impression Statement  Introduced some scapula stabilization and well as some eccentric biceps exercises. Pt reports no increase in pain. Pt has an initial catch in his R shoulder with passive flexion that went away as PROM progressed. he has good PROM in both UE's.    Personal Factors and Comorbidities  Behavior Pattern;Comorbidity 3+    Comorbidities  HTN, DM II, Bil shoulder surgery    Rehab Potential  Excellent    PT Frequency  2x / week    PT Duration  6 weeks    PT Next Visit Plan  Continue with eccentric exercise of the biceps, STM/IASTM to cording on the LUE       Patient will benefit from skilled therapeutic intervention in order to improve the following deficits and impairments:  Decreased range of motion, Decreased strength, Postural dysfunction, Pain  Visit Diagnosis: Chronic left shoulder pain  Pain in left arm  Abnormal posture     Problem List Patient Active Problem List   Diagnosis Date Noted  . Ventral incisional  hernia 04/14/2019  . Incisional hernia, without obstruction or gangrene 04/10/2019  . Pneumoperitoneum 05/17/2018  . Diverticulitis of colon with perforation s/p colectomy/ostomy 05/17/2018 05/17/2018  . S/P exploratory laparotomy 05/17/2018  . Diverticular disease   . Hypertension   . Hyperlipemia     Grayce Sessionsonald G Sunita Demond, PTA 10/08/2019, 11:09 AM  [facility] [address] [place], [place], [address] Phone: [phone]   Fax:  [phone]  Name: Elenore RotaLuis Vanwieren MRN: [id number] Date of Birth: 30-Jul-1951

## 2019-10-10 ENCOUNTER — Encounter: Payer: Self-pay | Admitting: Physical Therapy

## 2019-10-10 ENCOUNTER — Other Ambulatory Visit: Payer: Self-pay

## 2019-10-10 ENCOUNTER — Ambulatory Visit: Payer: Medicare Other | Admitting: Physical Therapy

## 2019-10-10 DIAGNOSIS — M25512 Pain in left shoulder: Secondary | ICD-10-CM | POA: Diagnosis not present

## 2019-10-10 DIAGNOSIS — G8929 Other chronic pain: Secondary | ICD-10-CM

## 2019-10-10 DIAGNOSIS — M79602 Pain in left arm: Secondary | ICD-10-CM

## 2019-10-10 DIAGNOSIS — R293 Abnormal posture: Secondary | ICD-10-CM

## 2019-10-10 NOTE — Therapy (Signed)
[facility] [address] [place], [place], [address] Phone: [phone]   Fax:  [phone]  Physical Therapy Treatment  Patient Details  Name: [name] MRN: [id number] Date of Birth: 05/16/1951 Referring Provider (PT): Dr. Thomasena Edisollins   Encounter Date: 10/10/2019  PT End of Session - 10/10/19 0901    Visit Number  3    Date for PT Re-Evaluation  11/20/19    PT Start Time  0800    PT Stop Time  0845    PT Time Calculation (min)  45 min    Activity Tolerance  Patient tolerated treatment well    Behavior During Therapy  [facility] for tasks assessed/performed       Past Medical History:  Diagnosis Date  . Asthma   . Back pain   . Diabetes mellitus    diet controlled   . Diverticular disease   . Environmental allergies   . History of diverticulitis of colon 05/17/2018   w/ perforation  s/p  sigmoid colectomy  . History of kidney stones   . Hyperlipemia   . Hypertension     Past Surgical History:  Procedure Laterality Date  . ACHILLES TENDON REPAIR Left 1974   ruptured  . bilateral cataract surgery     . COLON RESECTION N/A 05/17/2018   Procedure: EXPLORATORY LAPAROTOMY SIGMOID COLECTOMY AND  COLOSTOMY;  Surgeon: Darnell LevelGerkin, Todd, MD;  Location: [facility];  Service: General;  Laterality: N/A;  . COLONOSCOPY    . COLOSTOMY    . COLOSTOMY TAKEDOWN N/A 10/25/2018   Procedure: LAPAROSCOPIC COLOSTOMY REVERSAL;  Surgeon: [name], [name], MD;  Location: [facility];  Service: General;  Laterality: N/A;  . INCISIONAL HERNIA REPAIR N/A 04/14/2019   Procedure: OPEN  REPAIR INCISIONAL HERNIA WITH MESH Patch;  Surgeon: Darnell LevelGerkin, Todd, MD;  Location: [facility];  Service: General;  Laterality: N/A;  . KIDNEY STONE SURGERY    . KNEE ARTHROSCOPY Right   . SHOULDER ARTHROSCOPY W/ ROTATOR CUFF REPAIR Right 12-24-2010    dr sypher @[facility]   . SHOULDER ARTHROSCOPY W/ SUBACROMIAL DECOMPRESSION AND DISTAL CLAVICLE EXCISION Left 03-28-2012   dr sypher  @[facility]     debridement  . TRIGGER FINGER RELEASE  11/17/2011   Procedure: RELEASE TRIGGER FINGER/A-1 PULLEY;  Surgeon: Wyn Forsterobert V Sypher Jr., MD;  Location: [facility];  Service: Orthopedics;  Laterality: Left;  RELEASE LEFT LONG TRIGGER FINGER  . TUMOR REMOVAL     rt neck/chin    There were no vitals filed for this visit.  Subjective Assessment - 10/10/19 0801    Subjective  "I am feeling pretty good"    Currently in Pain?  No/denies                       [facility] Adult PT Treatment/Exercise - 10/10/19 0001      Shoulder Exercises: Standing   Horizontal ABduction  20 reps;Theraband;Both    External Rotation  Strengthening;Theraband;20 reps    Theraband Level (Shoulder External Rotation)  Level 3 (Green)    Flexion  Strengthening;20 reps;Weights;Both    Shoulder Flexion Weight (lbs)  5    ABduction  Strengthening;Weights;20 reps;Both    Extension  Theraband;20 reps;Both;Strengthening    Theraband Level (Shoulder Extension)  Level 3 (Green)    Row  [employer] Br[employer]heraband;20 reps;Both    Theraband Level (Shoulder Row)  Level 3 (Green)    Other Standing Exercises  Eccentric biceps curls.with manual resistance 2x5  Shoulder Exercises: ROM/Strengthening   UBE (Upper Arm Bike)  L 3 3 min each way       Manual Therapy   Manual Therapy  Passive ROM;Soft tissue mobilization;Neural Stretch    Soft tissue mobilization  L biceps and deltoid    Passive ROM  L shoulder PROM all diewctions    Neural Stretch  LUE mediann nerve glide stretch               PT Short Term Goals - 10/02/19 1537      PT SHORT TERM GOAL #1   Title  Pt will be independent with initial HEP within 3 weeks for autonomy of care.    Baseline  Pt does not have an HEP    Time  3    Period  Weeks    Status  New    Target Date  10/30/19      PT SHORT TERM GOAL #2   Title  Pt will demonstrate 150 degrees of L shoulder flexion w/decreased visible cording within 3 weeks in order to demonstrate  improved functional ROM and decreased limitations related to cording.    Baseline  Pt has cording present in the LUE from axilla to antebrachium and 143 degrees L shouler flexion    Time  3    Period  Weeks    Status  New    Target Date  10/30/19        PT Long Term Goals - 10/02/19 1539      PT LONG TERM GOAL #1   Title  Pt will report 2/10 pain in the L biceps when dancing per pt report to demonstrate an improved quality of life in order to decrease risk for immobility.    Baseline  5/10 pain    Time  6    Period  Weeks    Status  New    Target Date  11/20/19      PT LONG TERM GOAL #2   Title  Pt will demonstrate 4+/5 L shoulder IR/ER within 6 weeks to demonstrate improved functional strength.    Baseline  4-/5 ER, 4/5 IR    Time  6    Period  Weeks    Status  New    Target Date  11/20/19      PT LONG TERM GOAL #3   Title  Pt will demonstrate improved posture within 6 weeks w/VC w/o tactile cueing for correct posture to demonstrate improved proprioception of the trunk/cervical spine.    Baseline  significant fwd head posture and anteriorly rolled shoulders.    Time  6    Period  Weeks    Status  New    Target Date  11/20/19            Plan - 10/10/19 0903    Clinical Impression Statement  Pt tolerated today's treatment well evident by no reports of increase pain. Little ROM noted with resisted shoulder external rotation in LUE. Cues to keep core engaged core with shoulder extensions. Positive responded to STM.    Personal Factors and Comorbidities  Behavior Pattern;Comorbidity 3+    Comorbidities  HTN, DM II, Bil shoulder surgery    Stability/Clinical Decision Making  Stable/Uncomplicated    Rehab Potential  Excellent    PT Frequency  2x / week    PT Duration  6 weeks    PT Treatment/Interventions  Cryotherapy;Electrical Stimulation;Iontophoresis 4mg /ml Dexamethasone;Moist Heat;Functional mobility training;Therapeutic activities;Therapeutic exercise;Neuromuscular  re-education;Patient/family education;Manual techniques  PT Next Visit Plan  Continue with eccentric exercise of the biceps, STM/IASTM to cording on the LUE       Patient will benefit from skilled therapeutic intervention in order to improve the following deficits and impairments:  Decreased range of motion, Decreased strength, Postural dysfunction, Pain  Visit Diagnosis: Chronic left shoulder pain  Pain in left arm  Abnormal posture     Problem List Patient Active Problem List   Diagnosis Date Noted  . Ventral incisional hernia 04/14/2019  . Incisional hernia, without obstruction or gangrene 04/10/2019  . Pneumoperitoneum 05/17/2018  . Diverticulitis of colon with perforation s/p colectomy/ostomy 05/17/2018 05/17/2018  . S/P exploratory laparotomy 05/17/2018  . Diverticular disease   . Hypertension   . Hyperlipemia     Scot Jun, PTA 10/10/2019, 9:09 AM  [place], [place], [address] Phone: [phone]   Fax:  [phone]  Name: [name] MRN: [id number] Date of Birth: 1951-10-02

## 2019-10-10 NOTE — Patient Instructions (Signed)
Access Code: [email or web address]  URL: [email or web address]  Date: 10/10/2019  Prepared by: Cheri Fowler   Exercises  Standing Row with Resistance - 10 reps - 3 sets - 1x daily - 7x weekly  Shoulder extension with resistance - Neutral - 10 reps - 3 sets - 1x daily - 7x weekly  Standing Shoulder External Rotation with Resistance - 10 reps - 3 sets - 1x daily - 7x weekly

## 2019-10-14 ENCOUNTER — Encounter: Payer: Self-pay | Admitting: Physical Therapy

## 2019-10-14 ENCOUNTER — Other Ambulatory Visit: Payer: Self-pay

## 2019-10-14 ENCOUNTER — Ambulatory Visit: Payer: Medicare Other | Admitting: Physical Therapy

## 2019-10-14 DIAGNOSIS — M79602 Pain in left arm: Secondary | ICD-10-CM

## 2019-10-14 DIAGNOSIS — M25512 Pain in left shoulder: Secondary | ICD-10-CM

## 2019-10-14 DIAGNOSIS — R293 Abnormal posture: Secondary | ICD-10-CM

## 2019-10-14 DIAGNOSIS — G8929 Other chronic pain: Secondary | ICD-10-CM

## 2019-10-14 NOTE — Therapy (Signed)
[place], [place], [address] Phone: [phone]   Fax:  [phone]  Physical Therapy Treatment  Patient Details  Name: [name] MRN: [id number] Date of Birth: [birth year] Referring Provider (PT): Dr. Theda Sers   Encounter Date: 10/14/2019  PT End of Session - 10/14/19 0843    Visit Number  4    Number of Visits  13    Date for PT Re-Evaluation  11/20/19    PT Start Time  0758    PT Stop Time  0844    PT Time Calculation (min)  46 min    Activity Tolerance  Patient tolerated treatment well    Behavior During Therapy  [facility] for tasks assessed/performed       Past Medical History:  Diagnosis Date  . Asthma   . Back pain   . Diabetes mellitus    diet controlled   . Diverticular disease   . Environmental allergies   . History of diverticulitis of colon 05/17/2018   w/ perforation  s/p  sigmoid colectomy  . History of kidney stones   . Hyperlipemia   . Hypertension     Past Surgical History:  Procedure Laterality Date  . ACHILLES TENDON REPAIR Left 1974   ruptured  . bilateral cataract surgery     . COLON RESECTION N/A 05/17/2018   Procedure: EXPLORATORY LAPAROTOMY SIGMOID COLECTOMY AND  COLOSTOMY;  Surgeon: Armandina Gemma, MD;  Location: [facility];  Service: General;  Laterality: N/A;  . COLONOSCOPY    . COLOSTOMY    . COLOSTOMY TAKEDOWN N/A 10/25/2018   Procedure: LAPAROSCOPIC COLOSTOMY REVERSAL;  Surgeon: Leighton Ruff, MD;  Location: [facility];  Service: General;  Laterality: N/A;  . INCISIONAL HERNIA REPAIR N/A 04/14/2019   Procedure: OPEN  REPAIR INCISIONAL HERNIA WITH MESH Patch;  Surgeon: Armandina Gemma, MD;  Location: [facility];  Service: General;  Laterality: N/A;  . KIDNEY STONE SURGERY    . KNEE ARTHROSCOPY Right   . SHOULDER ARTHROSCOPY W/ ROTATOR CUFF REPAIR Right 12-24-2010    dr sypher @[facility]   . SHOULDER ARTHROSCOPY W/ SUBACROMIAL DECOMPRESSION AND DISTAL CLAVICLE EXCISION Left  03-28-2012   dr sypher  @[facility]    debridement  . TRIGGER FINGER RELEASE  11/17/2011   Procedure: RELEASE TRIGGER FINGER/A-1 PULLEY;  Surgeon: Cammie Sickle., MD;  Location: [facility];  Service: Orthopedics;  Laterality: Left;  RELEASE LEFT LONG TRIGGER FINGER  . TUMOR REMOVAL     rt neck/chin    There were no vitals filed for this visit.  Subjective Assessment - 10/14/19 0801    Subjective  Pt reports going to the MD yesterday and receiving an injection in his R shoulder and L knee. Pt also reports that the MD included his R shoulder in PT    Pertinent History  2018 history of RC repair Bilaterally    Currently in Pain?  No/denies    Pain Score  0-No pain         [facility] PT Assessment - 10/14/19 0001      AROM   Left Shoulder Flexion  171 Degrees    Left Shoulder ABduction  180 Degrees    Left Shoulder Internal Rotation  51 Degrees    Left Shoulder External Rotation  95 Degrees                   [facility] Adult PT Treatment/Exercise - 10/14/19 0001  Shoulder Exercises: Standing   External Rotation  Strengthening;Theraband;15 reps   x2   Theraband Level (Shoulder External Rotation)  Level 3 (Green)    Internal Rotation  Theraband;Strengthening;Left;15 reps   x2   Theraband Level (Shoulder Internal Rotation)  Level 3 (Green)    Flexion  Strengthening;20 reps;Weights;Both    Shoulder Flexion Weight (lbs)  5    ABduction  Strengthening;Weights;20 reps;Both    Shoulder ABduction Weight (lbs)  3    Other Standing Exercises  biceps curls 20lb 2x10    Other Standing Exercises  D2 flexion red 2x10       Shoulder Exercises: ROM/Strengthening   UBE (Upper Arm Bike)  L 3 3 min each way     Other ROM/Strengthening Exercises  Rows & Lats 25lb 2x10                PT Short Term Goals - 10/14/19 6063      PT SHORT TERM GOAL #1   Title  Pt will be independent with initial HEP within 3 weeks for autonomy of care.    Status  Achieved      PT  SHORT TERM GOAL #2   Title  Pt will demonstrate 150 degrees of L shoulder flexion w/decreased visible cording within 3 weeks in order to demonstrate improved functional ROM and decreased limitations related to cording.    Status  Partially Met        PT Long Term Goals - 10/02/19 1539      PT LONG TERM GOAL #1   Title  Pt will report 2/10 pain in the L biceps when dancing per pt report to demonstrate an improved quality of life in order to decrease risk for immobility.    Baseline  5/10 pain    Time  6    Period  Weeks    Status  New    Target Date  11/20/19      PT LONG TERM GOAL #2   Title  Pt will demonstrate 4+/5 L shoulder IR/ER within 6 weeks to demonstrate improved functional strength.    Baseline  4-/5 ER, 4/5 IR    Time  6    Period  Weeks    Status  New    Target Date  11/20/19      PT LONG TERM GOAL #3   Title  Pt will demonstrate improved posture within 6 weeks w/VC w/o tactile cueing for correct posture to demonstrate improved proprioception of the trunk/cervical spine.    Baseline  significant fwd head posture and anteriorly rolled shoulders.    Time  6    Period  Weeks    Status  New    Target Date  11/20/19            Plan - 10/14/19 0844    Clinical Impression Statement  Pt has progressed meeting some STG. He does report some improvement with his L biceps pain.  He has improved his L shoulder AROM overall but is still lacking some L shoulder internal rotation. Cues needed to prevent compensation with L shoulder internal and external rotation.    Comorbidities  HTN, DM II, Bil shoulder surgery    Examination-Activity Limitations  Lift;Carry    Stability/Clinical Decision Making  Stable/Uncomplicated    Rehab Potential  Excellent    PT Frequency  2x / week    PT Duration  6 weeks    PT Treatment/Interventions  Cryotherapy;Electrical Stimulation;Iontophoresis 71m/ml Dexamethasone;Moist Heat;Functional mobility training;Therapeutic activities;Therapeutic  exercise;Neuromuscular re-education;Patient/family education;Manual techniques    PT Next Visit Plan  Continue with eccentric exercise of the biceps, STM/IASTM to cording on the LUE       Patient will benefit from skilled therapeutic intervention in order to improve the following deficits and impairments:  Decreased range of motion, Decreased strength, Postural dysfunction, Pain  Visit Diagnosis: Pain in left arm  Abnormal posture  Chronic left shoulder pain     Problem List Patient Active Problem List   Diagnosis Date Noted  . Ventral incisional hernia 04/14/2019  . Incisional hernia, without obstruction or gangrene 04/10/2019  . Pneumoperitoneum 05/17/2018  . Diverticulitis of colon with perforation s/p colectomy/ostomy 05/17/2018 05/17/2018  . S/P exploratory laparotomy 05/17/2018  . Diverticular disease   . Hypertension   . Hyperlipemia     Scot Jun, PTA 10/14/2019, 8:50 AM  [place], [place], [address] Phone: [phone]   Fax:  [phone]  Name: [name] MRN: [id number] Date of Birth: Aug 07, 1951

## 2019-10-16 ENCOUNTER — Other Ambulatory Visit: Payer: Self-pay

## 2019-10-16 ENCOUNTER — Ambulatory Visit: Payer: Medicare Other | Admitting: Physical Therapy

## 2019-10-16 ENCOUNTER — Encounter: Payer: Self-pay | Admitting: Physical Therapy

## 2019-10-16 DIAGNOSIS — M79602 Pain in left arm: Secondary | ICD-10-CM

## 2019-10-16 DIAGNOSIS — G8929 Other chronic pain: Secondary | ICD-10-CM

## 2019-10-16 DIAGNOSIS — R293 Abnormal posture: Secondary | ICD-10-CM

## 2019-10-16 DIAGNOSIS — M25511 Pain in right shoulder: Secondary | ICD-10-CM

## 2019-10-16 DIAGNOSIS — M25512 Pain in left shoulder: Secondary | ICD-10-CM | POA: Diagnosis not present

## 2019-10-16 NOTE — Therapy (Signed)
[place], [place], [address] Phone: [phone]   Fax:  [phone]  Physical Therapy Treatment  Patient Details  Name: [name] MRN: [id number] Date of Birth: Aug 09, 1951 Referring Provider (PT): Dr. Theda Sers   Encounter Date: 10/16/2019  PT End of Session - 10/16/19 0836    Visit Number  5    Date for PT Re-Evaluation  11/20/19    PT Start Time  0758    PT Stop Time  0843    PT Time Calculation (min)  45 min    Activity Tolerance  Patient tolerated treatment well    Behavior During Therapy  [facility] for tasks assessed/performed       Past Medical History:  Diagnosis Date  . Asthma   . Back pain   . Diabetes mellitus    diet controlled   . Diverticular disease   . Environmental allergies   . History of diverticulitis of colon 05/17/2018   w/ perforation  s/p  sigmoid colectomy  . History of kidney stones   . Hyperlipemia   . Hypertension     Past Surgical History:  Procedure Laterality Date  . ACHILLES TENDON REPAIR Left 1974   ruptured  . bilateral cataract surgery     . COLON RESECTION N/A 05/17/2018   Procedure: EXPLORATORY LAPAROTOMY SIGMOID COLECTOMY AND  COLOSTOMY;  Surgeon: Armandina Gemma, MD;  [facility];  Service: General;  Laterality: N/A;  . COLONOSCOPY    . COLOSTOMY    . COLOSTOMY TAKEDOWN N/A 10/25/2018   Procedure: LAPAROSCOPIC COLOSTOMY REVERSAL;  Surgeon: Leighton Ruff, MD;  [facility];  Service: General;  Laterality: N/A;  . INCISIONAL HERNIA REPAIR N/A 04/14/2019   Procedure: OPEN  REPAIR INCISIONAL HERNIA WITH MESH Patch;  Surgeon: Armandina Gemma, MD;  [facility];  Service: General;  Laterality: N/A;  . KIDNEY STONE SURGERY    . KNEE ARTHROSCOPY Right   . SHOULDER ARTHROSCOPY W/ ROTATOR CUFF REPAIR Right 12-24-2010    dr sypher _0   . SHOULDER ARTHROSCOPY W/ SUBACROMIAL DECOMPRESSION AND DISTAL CLAVICLE EXCISION Left 03-28-2012   dr sypher  _1     debridement  . TRIGGER FINGER RELEASE  11/17/2011   Procedure: RELEASE TRIGGER FINGER/A-1 PULLEY;  Surgeon: Cammie Sickle., MD;  Location: [place];  Service: Orthopedics;  Laterality: Left;  RELEASE LEFT LONG TRIGGER FINGER  . TUMOR REMOVAL     rt neck/chin    There were no vitals filed for this visit.  Subjective Assessment - 10/16/19 0806    Subjective  Patient had an new referral that was sent to [place] to include the other arm however we did the evaluation on both arms at the intial visit.  He reports that he is doing better having less pain in both shoulders.    Currently in Pain?  No/denies                       [facility] Adult PT Treatment/Exercise - 10/16/19 0001      Shoulder Exercises: Seated   Other Seated Exercises  5# bent over row, extension and 3# reverse flies      Shoulder Exercises: Standing   External Rotation  Both;Theraband    Theraband Level (Shoulder External Rotation)  Level 3 (Green)    External Rotation Limitations  2x15    Internal Rotation  Theraband;20 reps    Theraband Level (Shoulder Internal Rotation)  Level  3 (Green)      Shoulder Exercises: ROM/Strengthening   UBE (Upper Arm Bike)  L 5 3 min each way     Lat Pull  2.5 plate;20 reps    Cybex Row  2.5 plate;20 reps      Shoulder Exercises: Stretch   Corner Stretch  3 reps;10 seconds    Cross Chest Stretch  3 reps;10 seconds               PT Short Term Goals - 10/14/19 3267      PT SHORT TERM GOAL #1   Title  Pt will be independent with initial HEP within 3 weeks for autonomy of care.    Status  Achieved      PT SHORT TERM GOAL #2   Title  Pt will demonstrate 150 degrees of L shoulder flexion w/decreased visible cording within 3 weeks in order to demonstrate improved functional ROM and decreased limitations related to cording.    Status  Partially Met        PT Long Term Goals - 10/16/19 0843      PT LONG TERM GOAL #1   Title  Pt will report  2/10 pain in the L biceps when dancing per pt report to demonstrate an improved quality of life in order to decrease risk for immobility.    Status  Partially Met      PT LONG TERM GOAL #2   Title  Pt will demonstrate 4+/5 L shoulder IR/ER within 6 weeks to demonstrate improved functional strength.    Status  Partially Met      PT LONG TERM GOAL #3   Title  Pt will demonstrate improved posture within 6 weeks w/VC w/o tactile cueing for correct posture to demonstrate improved proprioception of the trunk/cervical spine.    Status  On-going            Plan - 10/16/19 0842    Clinical Impression Statement  Patient reports that he is feeling better and haivng less pain, he went back to doing a bench press type exercise on his own, I cautioned him about this and to go slow and really focus on the RC and scapular stabilization exercises    PT Next Visit Plan  will continue to focus on his RC and scapular stability    Consulted and Agree with Plan of Care  Patient       Patient will benefit from skilled therapeutic intervention in order to improve the following deficits and impairments:  Decreased range of motion, Decreased strength, Postural dysfunction, Pain  Visit Diagnosis: Pain in left arm  Abnormal posture  Chronic left shoulder pain  Acute pain of right shoulder     Problem List Patient Active Problem List   Diagnosis Date Noted  . Ventral incisional hernia 04/14/2019  . Incisional hernia, without obstruction or gangrene 04/10/2019  . Pneumoperitoneum 05/17/2018  . Diverticulitis of colon with perforation s/p colectomy/ostomy 05/17/2018 05/17/2018  . S/P exploratory laparotomy 05/17/2018  . Diverticular disease   . Hypertension   . Hyperlipemia     Sumner Boast., PT 10/16/2019, 8:44 AM  [place], [place], [address] Phone: [phone]   Fax:  [phone]  Name: [name] MRN: [id number] Date of Birth: 1951/08/05

## 2019-10-22 ENCOUNTER — Ambulatory Visit: Payer: Medicare Other | Admitting: Physical Therapy

## 2019-10-22 ENCOUNTER — Encounter: Payer: Self-pay | Admitting: Physical Therapy

## 2019-10-22 ENCOUNTER — Other Ambulatory Visit: Payer: Self-pay

## 2019-10-22 DIAGNOSIS — G8929 Other chronic pain: Secondary | ICD-10-CM

## 2019-10-22 DIAGNOSIS — M79602 Pain in left arm: Secondary | ICD-10-CM

## 2019-10-22 DIAGNOSIS — M25511 Pain in right shoulder: Secondary | ICD-10-CM

## 2019-10-22 DIAGNOSIS — R293 Abnormal posture: Secondary | ICD-10-CM

## 2019-10-22 DIAGNOSIS — M25512 Pain in left shoulder: Secondary | ICD-10-CM | POA: Diagnosis not present

## 2019-10-22 NOTE — Therapy (Signed)
[place], [place], [address] Phone: [phone]   Fax:  [phone]  Physical Therapy Treatment  Patient Details  Name: [name] MRN: [id number] Date of Birth: June 03, 1951 Referring Provider (PT): Dr. Theda Sers   Encounter Date: 10/22/2019  PT End of Session - 10/22/19 0843    Visit Number  6    Number of Visits  13    Date for PT Re-Evaluation  11/20/19    PT Start Time  0758    PT Stop Time  0842    PT Time Calculation (min)  44 min    Activity Tolerance  Patient tolerated treatment well    Behavior During Therapy  [facility] for tasks assessed/performed       Past Medical History:  Diagnosis Date  . Asthma   . Back pain   . Diabetes mellitus    diet controlled   . Diverticular disease   . Environmental allergies   . History of diverticulitis of colon 05/17/2018   w/ perforation  s/p  sigmoid colectomy  . History of kidney stones   . Hyperlipemia   . Hypertension     Past Surgical History:  Procedure Laterality Date  . ACHILLES TENDON REPAIR Left 1974   ruptured  . bilateral cataract surgery     . COLON RESECTION N/A 05/17/2018   Procedure: EXPLORATORY LAPAROTOMY SIGMOID COLECTOMY AND  COLOSTOMY;  Surgeon: Armandina Gemma, MD;  [facility];  Service: General;  Laterality: N/A;  . COLONOSCOPY    . COLOSTOMY    . COLOSTOMY TAKEDOWN N/A 10/25/2018   Procedure: LAPAROSCOPIC COLOSTOMY REVERSAL;  Surgeon: Leighton Ruff, MD;  [facility];  Service: General;  Laterality: N/A;  . INCISIONAL HERNIA REPAIR N/A 04/14/2019   Procedure: OPEN  REPAIR INCISIONAL HERNIA WITH MESH Patch;  Surgeon: Armandina Gemma, MD;  [facility];  Service: General;  Laterality: N/A;  . KIDNEY STONE SURGERY    . KNEE ARTHROSCOPY Right   . SHOULDER ARTHROSCOPY W/ ROTATOR CUFF REPAIR Right 12-24-2010    dr sypher @[facility]   . SHOULDER ARTHROSCOPY W/ SUBACROMIAL DECOMPRESSION AND DISTAL CLAVICLE EXCISION Left  03-28-2012   dr sypher  @[facility]    debridement  . TRIGGER FINGER RELEASE  11/17/2011   Procedure: RELEASE TRIGGER FINGER/A-1 PULLEY;  Surgeon: Cammie Sickle., MD;  Location: [place];  Service: Orthopedics;  Laterality: Left;  RELEASE LEFT LONG TRIGGER FINGER  . TUMOR REMOVAL     rt neck/chin    There were no vitals filed for this visit.  Subjective Assessment - 10/22/19 0759    Subjective  Patient reports that he was feeling very good and on Saturday he tried some dumbell bench presses and felt some incresaed pain.  He reports that he stopped this    Currently in Pain?  No/denies                       [facility] Adult PT Treatment/Exercise - 10/22/19 0001      Shoulder Exercises: Seated   Other Seated Exercises  8# bent over row, extension and 3# reverse flies      Shoulder Exercises: Standing   External Rotation  Both;Theraband    Theraband Level (Shoulder External Rotation)  Level 3 (Green)    Other Standing Exercises  hands on wall banded reach vectors      Shoulder Exercises: ROM/Strengthening   UBE (Upper Arm Bike)  L 6  3 min each way     Lat Pull  3.5 plate;20 reps    Cybex Press  2.5 plate;20 reps    Cybex Press Limitations  tried two different posittions, then with 45# did serratus push    Cybex Row  3.5 plate;20 reps    Other ROM/Strengthening Exercises  25# bow pulls each arm, stopped this due to pain in the bicep    Other ROM/Strengthening Exercises  5# on pulleys crossed upper and mid back reverse flies      Shoulder Exercises: Stretch   Corner Stretch  3 reps;10 seconds    Cross Chest Stretch  3 reps;10 seconds      Shoulder Exercises: Body Blade   Flexion  30 seconds    ABduction  30 seconds    External Rotation  30 seconds    Internal Rotation  30 seconds               PT Short Term Goals - 10/14/19 0811      PT SHORT TERM GOAL #1   Title  Pt will be independent with initial HEP within 3 weeks for autonomy of care.     Status  Achieved      PT SHORT TERM GOAL #2   Title  Pt will demonstrate 150 degrees of L shoulder flexion w/decreased visible cording within 3 weeks in order to demonstrate improved functional ROM and decreased limitations related to cording.    Status  Partially Met        PT Long Term Goals - 10/22/19 0845      PT LONG TERM GOAL #1   Title  Pt will report 2/10 pain in the L biceps when dancing per pt report to demonstrate an improved quality of life in order to decrease risk for immobility.    Status  Partially Met      PT LONG TERM GOAL #2   Title  Pt will demonstrate 4+/5 L shoulder IR/ER within 6 weeks to demonstrate improved functional strength.    Status  Partially Met            Plan - 10/22/19 0843    Clinical Impression Statement  Patient doing well, he tried some dumbbell benches over the weekend and that increased pain, today he had pain in the right biceps with the bow pulls,  All other exercises tolerated well without pain, he did have some lack of coordination with the left arm using the body blade    PT Next Visit Plan  will continue to focus on his RC and scapular stability    Consulted and Agree with Plan of Care  Patient       Patient will benefit from skilled therapeutic intervention in order to improve the following deficits and impairments:  Decreased range of motion, Decreased strength, Postural dysfunction, Pain  Visit Diagnosis: Pain in left arm  Abnormal posture  Chronic left shoulder pain  Acute pain of right shoulder     Problem List Patient Active Problem List   Diagnosis Date Noted  . Ventral incisional hernia 04/14/2019  . Incisional hernia, without obstruction or gangrene 04/10/2019  . Pneumoperitoneum 05/17/2018  . Diverticulitis of colon with perforation s/p colectomy/ostomy 05/17/2018 05/17/2018  . S/P exploratory laparotomy 05/17/2018  . Diverticular disease   . Hypertension   . Hyperlipemia     Sumner Boast.,  PT 10/22/2019, 8:46 AM  [place], [place],  [address] Phone: [phone]   Fax:  [phone]  Name: [name] MRN: [id number] Date of Birth: [birth year]

## 2019-10-24 ENCOUNTER — Other Ambulatory Visit: Payer: Self-pay

## 2019-10-24 ENCOUNTER — Ambulatory Visit: Payer: Medicare Other | Admitting: Physical Therapy

## 2019-10-24 ENCOUNTER — Encounter: Payer: Self-pay | Admitting: Physical Therapy

## 2019-10-24 DIAGNOSIS — R293 Abnormal posture: Secondary | ICD-10-CM

## 2019-10-24 DIAGNOSIS — M79602 Pain in left arm: Secondary | ICD-10-CM

## 2019-10-24 DIAGNOSIS — M25511 Pain in right shoulder: Secondary | ICD-10-CM

## 2019-10-24 DIAGNOSIS — M25512 Pain in left shoulder: Secondary | ICD-10-CM | POA: Diagnosis not present

## 2019-10-24 DIAGNOSIS — G8929 Other chronic pain: Secondary | ICD-10-CM

## 2019-10-24 NOTE — Therapy (Signed)
[place], [place], [address] Phone: [phone]   Fax:  [phone]  Physical Therapy Treatment  Patient Details  Name: [name] MRN: [id number] Date of Birth: 1950/12/21 Referring Provider (PT): Dr. Theda Sers   Encounter Date: 10/24/2019  PT End of Session - 10/24/19 0844    Visit Number  7    Number of Visits  13    Date for PT Re-Evaluation  11/20/19    PT Start Time  0800    PT Stop Time  0844    PT Time Calculation (min)  44 min    Activity Tolerance  Patient tolerated treatment well    Behavior During Therapy  [facility] for tasks assessed/performed       Past Medical History:  Diagnosis Date  . Asthma   . Back pain   . Diabetes mellitus    diet controlled   . Diverticular disease   . Environmental allergies   . History of diverticulitis of colon 05/17/2018   w/ perforation  s/p  sigmoid colectomy  . History of kidney stones   . Hyperlipemia   . Hypertension     Past Surgical History:  Procedure Laterality Date  . ACHILLES TENDON REPAIR Left 1974   ruptured  . bilateral cataract surgery     . COLON RESECTION N/A 05/17/2018   Procedure: EXPLORATORY LAPAROTOMY SIGMOID COLECTOMY AND  COLOSTOMY;  Surgeon: Armandina Gemma, MD;  [facility];  Service: General;  Laterality: N/A;  . COLONOSCOPY    . COLOSTOMY    . COLOSTOMY TAKEDOWN N/A 10/25/2018   Procedure: LAPAROSCOPIC COLOSTOMY REVERSAL;  Surgeon: Leighton Ruff, MD;  [facility];  Service: General;  Laterality: N/A;  . INCISIONAL HERNIA REPAIR N/A 04/14/2019   Procedure: OPEN  REPAIR INCISIONAL HERNIA WITH MESH Patch;  Surgeon: Armandina Gemma, MD;  [facility];  Service: General;  Laterality: N/A;  . KIDNEY STONE SURGERY    . KNEE ARTHROSCOPY Right   . SHOULDER ARTHROSCOPY W/ ROTATOR CUFF REPAIR Right 12-24-2010    dr sypher @[facility]   . SHOULDER ARTHROSCOPY W/ SUBACROMIAL DECOMPRESSION AND DISTAL CLAVICLE EXCISION Left  03-28-2012   dr sypher  @[facility]    debridement  . TRIGGER FINGER RELEASE  11/17/2011   Procedure: RELEASE TRIGGER FINGER/A-1 PULLEY;  Surgeon: Cammie Sickle., MD;  Location: [place];  Service: Orthopedics;  Laterality: Left;  RELEASE LEFT LONG TRIGGER FINGER  . TUMOR REMOVAL     rt neck/chin    There were no vitals filed for this visit.  Subjective Assessment - 10/24/19 0804    Subjective  "Doing good today"    Currently in Pain?  No/denies                       [facility] Adult PT Treatment/Exercise - 10/24/19 0001      Shoulder Exercises: Standing   External Rotation  Both;Theraband    Theraband Level (Shoulder External Rotation)  Level 3 (Green)    Extension  Theraband;20 reps;Both;Strengthening    Theraband Level (Shoulder Extension)  Level 3 (Green)    Other Standing Exercises  Hammer pronation and supination of wrist 2x10    Other Standing Exercises  biceps curls 7lb 2x10, Then with pronated grip 6lb 2x10       Shoulder Exercises: ROM/Strengthening   UBE (Upper Arm Bike)  L 6 3 min each way     Lat Pull  3.5  plate;20 reps    Cybex Press  2.5 plate;20 reps    Cybex Press Limitations  tried two different posittions, then with 45# did serratus push    Cybex Row  3.5 plate;20 reps    Other ROM/Strengthening Exercises  3 way ER shoulder abd to 90 x10 each    Other ROM/Strengthening Exercises  D2 flex 5lb x10       Shoulder Exercises: Stretch   Corner Stretch  3 reps;10 seconds    Cross Chest Stretch  3 reps;10 seconds               PT Short Term Goals - 10/14/19 9242      PT SHORT TERM GOAL #1   Title  Pt will be independent with initial HEP within 3 weeks for autonomy of care.    Status  Achieved      PT SHORT TERM GOAL #2   Title  Pt will demonstrate 150 degrees of L shoulder flexion w/decreased visible cording within 3 weeks in order to demonstrate improved functional ROM and decreased limitations related to cording.    Status   Partially Met        PT Long Term Goals - 10/22/19 0845      PT LONG TERM GOAL #1   Title  Pt will report 2/10 pain in the L biceps when dancing per pt report to demonstrate an improved quality of life in order to decrease risk for immobility.    Status  Partially Met      PT LONG TERM GOAL #2   Title  Pt will demonstrate 4+/5 L shoulder IR/ER within 6 weeks to demonstrate improved functional strength.    Status  Partially Met            Plan - 10/24/19 0845    Clinical Impression Statement  Pt was able to complete all of today's interventions. He did reports that he could feel a pull in his R shoulder with D2 flexion. no pain reported with the exercises. No issues with biceps curls when utilizing supine or pronated grip.    Personal Factors and Comorbidities  Behavior Pattern;Comorbidity 3+    Comorbidities  HTN, DM II, Bil shoulder surgery    Rehab Potential  Excellent    PT Frequency  2x / week    PT Duration  6 weeks    PT Treatment/Interventions  Cryotherapy;Electrical Stimulation;Iontophoresis 58m/ml Dexamethasone;Moist Heat;Functional mobility training;Therapeutic activities;Therapeutic exercise;Neuromuscular re-education;Patient/family education;Manual techniques    PT Next Visit Plan  will continue to focus on his RC and scapular stability       Patient will benefit from skilled therapeutic intervention in order to improve the following deficits and impairments:  Decreased range of motion, Decreased strength, Postural dysfunction, Pain  Visit Diagnosis: Abnormal posture  Pain in left arm  Chronic left shoulder pain  Acute pain of right shoulder     Problem List Patient Active Problem List   Diagnosis Date Noted  . Ventral incisional hernia 04/14/2019  . Incisional hernia, without obstruction or gangrene 04/10/2019  . Pneumoperitoneum 05/17/2018  . Diverticulitis of colon with perforation s/p colectomy/ostomy 05/17/2018 05/17/2018  . S/P exploratory  laparotomy 05/17/2018  . Diverticular disease   . Hypertension   . Hyperlipemia     RScot Jun PTA 10/24/2019, 8:52 AM  [place] [place] [address] [address]: [phone]  Fax:  [phone] Name: Roger DavidowMRN: [id number]  Date of Birth: February 11, 1951

## 2019-10-27 ENCOUNTER — Ambulatory Visit: Payer: Medicare Other | Admitting: Physical Therapy

## 2019-10-27 ENCOUNTER — Other Ambulatory Visit: Payer: Self-pay

## 2019-10-27 DIAGNOSIS — M25512 Pain in left shoulder: Secondary | ICD-10-CM | POA: Diagnosis not present

## 2019-10-27 DIAGNOSIS — R293 Abnormal posture: Secondary | ICD-10-CM

## 2019-10-27 DIAGNOSIS — M79602 Pain in left arm: Secondary | ICD-10-CM

## 2019-10-27 NOTE — Therapy (Signed)
[name]- [name] [address] [place], [place], [address] Phone: [phone]   Fax:  [phone]  Physical Therapy Treatment  Patient Details  Name: [name] MRN: [id number] Date of Birth: 07/27/1951 Referring Provider (PT): [name]   Encounter Date: 10/27/2019  PT End of Session - 10/27/19 1553    Visit Number  8    PT Start Time  1515    PT Stop Time  1554    PT Time Calculation (min)  39 min       Past Medical History:  Diagnosis Date  . Asthma   . Back pain   . Diabetes mellitus    diet controlled   . Diverticular disease   . Environmental allergies   . History of diverticulitis of colon 05/17/2018   w/ perforation  s/p  sigmoid colectomy  . History of kidney stones   . Hyperlipemia   . Hypertension     Past Surgical History:  Procedure Laterality Date  . ACHILLES TENDON REPAIR Left 1974   ruptured  . bilateral cataract surgery     . COLON RESECTION N/A 05/17/2018   Procedure: EXPLORATORY LAPAROTOMY SIGMOID COLECTOMY AND  COLOSTOMY;  Surgeon: Gerkin, Todd, MD;  [facility];  Service: General;  Laterality: N/A;  . COLONOSCOPY    . COLOSTOMY    . COLOSTOMY TAKEDOWN N/A 10/25/2018   Procedure: LAPAROSCOPIC COLOSTOMY REVERSAL;  Surgeon: Thomas, [name], MD;  [facility];  Service: General;  Laterality: N/A;  . INCISIONAL HERNIA REPAIR N/A 04/14/2019   Procedure: OPEN  REPAIR INCISIONAL HERNIA WITH MESH Patch;  Surgeon: Gerkin, Todd, MD;  [facility];  Service: General;  Laterality: N/A;  . KIDNEY STONE SURGERY    . KNEE ARTHROSCOPY Right   . SHOULDER ARTHROSCOPY W/ ROTATOR CUFF REPAIR Right 12-24-2010    dr sypher @[facility]  . SHOULDER ARTHROSCOPY W/ SUBACROMIAL DECOMPRESSION AND DISTAL CLAVICLE EXCISION Left 03-28-2012   dr sypher  @[facility]   debridement  . TRIGGER FINGER RELEASE  11/17/2011   Procedure: RELEASE TRIGGER FINGER/A-1 PULLEY;  Surgeon: Roger V Sypher Jr., MD;  Location: [name]  [name];  Service: Orthopedics;  Laterality: Left;  RELEASE LEFT LONG TRIGGER FINGER  . TUMOR REMOVAL     rt neck/chin    There were no vitals filed for this visit.  Subjective Assessment - 10/27/19 1517    Subjective  "doing okay, had a little pain after last time"    Currently in Pain?  No/denies                       [facility] Adult PT Treatment/Exercise - 10/27/19 0001      Shoulder Exercises: Standing   External Rotation  Both;Theraband    Theraband Level (Shoulder External Rotation)  Level 3 (Green)    Internal Rotation  Theraband;20 reps    Theraband Level (Shoulder Internal Rotation)  Level 3 (Green)    Flexion  Strengthening;20 reps;Weights;Both   AAROM w/ cane   Extension  Theraband;20 reps;Both;Strengthening    Theraband Level (Shoulder Extension)  Level 3 (Green)    Other Standing Exercises  biceps curls 7lb 2x10      Shoulder Exercises: ROM/Strengthening   UBE (Upper Arm Bike)  L 6 3 min each way     Lat Pull  3.5 plate;20 reps;10 reps    Cybex Row  3.5 plate;20 reps;10 reps      Shoulder Exercises: Stretch     Other Shoulder Stretches  T and W stretches x5, 10 seconds               PT Short Term Goals - 10/14/19 2641      PT SHORT TERM GOAL #1   Title  Pt will be independent with initial HEP within 3 weeks for autonomy of care.    Status  Achieved      PT SHORT TERM GOAL #2   Title  Pt will demonstrate 150 degrees of L shoulder flexion w/decreased visible cording within 3 weeks in order to demonstrate improved functional ROM and decreased limitations related to cording.    Status  Partially Met        PT Long Term Goals - 10/27/19 1520      PT LONG TERM GOAL #1   Title  Pt will report 2/10 pain in the L biceps when dancing per pt report to demonstrate an improved quality of life in order to decrease risk for immobility.    Status  Achieved      PT LONG TERM GOAL #2   Title  Pt will demonstrate 4+/5 L shoulder IR/ER  within 6 weeks to demonstrate improved functional strength.    Status  Achieved      PT LONG TERM GOAL #3   Title  Pt will demonstrate improved posture within 6 weeks w/VC w/o tactile cueing for correct posture to demonstrate improved proprioception of the trunk/cervical spine.    Status  Partially Met            Plan - 10/27/19 1559    Clinical Impression Statement  pt tolerated treatment well as demonstrated by no increase of pain with interventions. pt has met his goal of ER/IR strength. pt able to increase reps with rows and lat pulls. pt continues to work on postural stability. pt has limited motion with T and W stretches at the wall.    Personal Factors and Comorbidities  Behavior Pattern;Comorbidity 3+    Comorbidities  HTN, DM II, Bil shoulder surgery    Examination-Activity Limitations  Lift;Carry    Stability/Clinical Decision Making  Stable/Uncomplicated    Rehab Potential  Excellent    PT Frequency  2x / week    PT Duration  6 weeks    PT Treatment/Interventions  Cryotherapy;Electrical Stimulation;Iontophoresis 44m/ml Dexamethasone;Moist Heat;Functional mobility training;Therapeutic activities;Therapeutic exercise;Neuromuscular re-education;Patient/family education;Manual techniques    PT Next Visit Plan  will continue to focus on his RC and scapular stability       Patient will benefit from skilled therapeutic intervention in order to improve the following deficits and impairments:  Decreased range of motion, Decreased strength, Postural dysfunction, Pain  Visit Diagnosis: Pain in left arm  Abnormal posture     Problem List Patient Active Problem List   Diagnosis Date Noted  . Ventral incisional hernia 04/14/2019  . Incisional hernia, without obstruction or gangrene 04/10/2019  . Pneumoperitoneum 05/17/2018  . Diverticulitis of colon with perforation s/p colectomy/ostomy 05/17/2018 05/17/2018  . S/P exploratory laparotomy 05/17/2018  . Diverticular disease    . Hypertension   . Hyperlipemia     SBarrett Henle SAllensville11/23/2020, 4:03 PM  [place] [address]Phone: [phone]  Fax:  [phone] Name: LSimeon VeraMRN: [id number] of Birth: [birth year]

## 2019-11-04 ENCOUNTER — Other Ambulatory Visit: Payer: Self-pay

## 2019-11-04 ENCOUNTER — Encounter: Payer: Self-pay | Admitting: Physical Therapy

## 2019-11-04 ENCOUNTER — Ambulatory Visit: Payer: Medicare Other | Attending: Specialist | Admitting: Physical Therapy

## 2019-11-04 DIAGNOSIS — G8929 Other chronic pain: Secondary | ICD-10-CM

## 2019-11-04 DIAGNOSIS — M25511 Pain in right shoulder: Secondary | ICD-10-CM

## 2019-11-04 DIAGNOSIS — R293 Abnormal posture: Secondary | ICD-10-CM | POA: Diagnosis present

## 2019-11-04 DIAGNOSIS — M25512 Pain in left shoulder: Secondary | ICD-10-CM | POA: Insufficient documentation

## 2019-11-04 DIAGNOSIS — M79602 Pain in left arm: Secondary | ICD-10-CM

## 2019-11-04 NOTE — Therapy (Signed)
[place], [place], [address] Phone: [phone]   Fax:  [phone]  Physical Therapy Treatment  Patient Details  Name: [name] MRN: [id number] Date of Birth: 1951/06/11 Referring Provider (PT): Dr. Theda Sers   Encounter Date: 11/04/2019  PT End of Session - 11/04/19 0842    Visit Number  9    Number of Visits  13    Date for PT Re-Evaluation  11/20/19    PT Start Time  0800    PT Stop Time  0842    PT Time Calculation (min)  42 min    Activity Tolerance  Patient tolerated treatment well    Behavior During Therapy  [facility] for tasks assessed/performed       Past Medical History:  Diagnosis Date  . Asthma   . Back pain   . Diabetes mellitus    diet controlled   . Diverticular disease   . Environmental allergies   . History of diverticulitis of colon 05/17/2018   w/ perforation  s/p  sigmoid colectomy  . History of kidney stones   . Hyperlipemia   . Hypertension     Past Surgical History:  Procedure Laterality Date  . ACHILLES TENDON REPAIR Left 1974   ruptured  . bilateral cataract surgery     . COLON RESECTION N/A 05/17/2018   Procedure: EXPLORATORY LAPAROTOMY SIGMOID COLECTOMY AND  COLOSTOMY;  Surgeon: Armandina Gemma, MD;  [facility];  Service: General;  Laterality: N/A;  . COLONOSCOPY    . COLOSTOMY    . COLOSTOMY TAKEDOWN N/A 10/25/2018   Procedure: LAPAROSCOPIC COLOSTOMY REVERSAL;  Surgeon: Leighton Ruff, MD;  [facility];  Service: General;  Laterality: N/A;  . INCISIONAL HERNIA REPAIR N/A 04/14/2019   Procedure: OPEN  REPAIR INCISIONAL HERNIA WITH MESH Patch;  Surgeon: Armandina Gemma, MD;  [facility];  Service: General;  Laterality: N/A;  . KIDNEY STONE SURGERY    . KNEE ARTHROSCOPY Right   . SHOULDER ARTHROSCOPY W/ ROTATOR CUFF REPAIR Right 12-24-2010    dr sypher @[facility]   . SHOULDER ARTHROSCOPY W/ SUBACROMIAL DECOMPRESSION AND DISTAL CLAVICLE EXCISION Left  03-28-2012   dr sypher  @[facility]    debridement  . TRIGGER FINGER RELEASE  11/17/2011   Procedure: RELEASE TRIGGER FINGER/A-1 PULLEY;  Surgeon: Cammie Sickle., MD;  Location: [place];  Service: Orthopedics;  Laterality: Left;  RELEASE LEFT LONG TRIGGER FINGER  . TUMOR REMOVAL     rt neck/chin    There were no vitals filed for this visit.  Subjective Assessment - 11/04/19 0800    Subjective  "Feeling pretty good"    Currently in Pain?  No/denies                       [facility] Adult PT Treatment/Exercise - 11/04/19 0001      Shoulder Exercises: Standing   External Rotation  Both;Theraband    Theraband Level (Shoulder External Rotation)  Level 3 (Green)    Internal Rotation  Theraband;20 reps;Both;Strengthening    Theraband Level (Shoulder Internal Rotation)  Level 3 (Green)    Flexion  Strengthening;20 reps;Weights;Both    Shoulder Flexion Weight (lbs)  4    Extension  Theraband;20 reps;Both;Strengthening    Extension Weight (lbs)  10    Other Standing Exercises  wall push ups 2x10     Other Standing Exercises  biceps curls triceps ext 35lb 2x10 each  Shoulder Exercises: ROM/Strengthening   Nustep  L6 x 3 min, L2 UE only x 3 min     Cybex Press  2.5 plate;20 reps    Other ROM/Strengthening Exercises  Rows and lats 45lb 2x10       Shoulder Exercises: Stretch   Corner Stretch  3 reps;10 seconds    Cross Chest Stretch  3 reps;10 seconds               PT Short Term Goals - 10/14/19 2800      PT SHORT TERM GOAL #1   Title  Pt will be independent with initial HEP within 3 weeks for autonomy of care.    Status  Achieved      PT SHORT TERM GOAL #2   Title  Pt will demonstrate 150 degrees of L shoulder flexion w/decreased visible cording within 3 weeks in order to demonstrate improved functional ROM and decreased limitations related to cording.    Status  Partially Met        PT Long Term Goals - 11/04/19 0844      PT LONG TERM  GOAL #3   Title  Pt will demonstrate improved posture within 6 weeks w/VC w/o tactile cueing for correct posture to demonstrate improved proprioception of the trunk/cervical spine.    Status  Partially Met            Plan - 11/04/19 0842    Clinical Impression Statement  Pt did well with a progressed treatment session. No reports of increase pain. Increase weight tolerated with seated rows and lats. Tactile cues needed to keep elbow to his side with external and internal rotation.    Comorbidities  HTN, DM II, Bil shoulder surgery    Examination-Activity Limitations  Lift;Carry    Rehab Potential  Excellent    PT Frequency  2x / week    PT Duration  6 weeks    PT Treatment/Interventions  Cryotherapy;Electrical Stimulation;Iontophoresis 9m/ml Dexamethasone;Moist Heat;Functional mobility training;Therapeutic activities;Therapeutic exercise;Neuromuscular re-education;Patient/family education;Manual techniques    PT Next Visit Plan  will continue to focus on his RC and scapular stability       Patient will benefit from skilled therapeutic intervention in order to improve the following deficits and impairments:  Decreased range of motion, Decreased strength, Postural dysfunction, Pain  Visit Diagnosis: Pain in left arm  Abnormal posture  Chronic left shoulder pain  Acute pain of right shoulder     Problem List Patient Active Problem List   Diagnosis Date Noted  . Ventral incisional hernia 04/14/2019  . Incisional hernia, without obstruction or gangrene 04/10/2019  . Pneumoperitoneum 05/17/2018  . Diverticulitis of colon with perforation s/p colectomy/ostomy 05/17/2018 05/17/2018  . S/P exploratory laparotomy 05/17/2018  . Diverticular disease   . Hypertension   . Hyperlipemia     RScot Jun PTA 11/04/2019, 8:45 AM  [address] [address]Phone: [phone]  Fax:   [phone] Name: Roger ThibaultMRN: [id number] of Birth: [birth year]

## 2019-11-06 ENCOUNTER — Encounter: Payer: Self-pay | Admitting: Physical Therapy

## 2019-11-06 ENCOUNTER — Other Ambulatory Visit: Payer: Self-pay

## 2019-11-06 ENCOUNTER — Ambulatory Visit: Payer: Medicare Other | Admitting: Physical Therapy

## 2019-11-06 DIAGNOSIS — M79602 Pain in left arm: Secondary | ICD-10-CM

## 2019-11-06 DIAGNOSIS — M25511 Pain in right shoulder: Secondary | ICD-10-CM

## 2019-11-06 DIAGNOSIS — G8929 Other chronic pain: Secondary | ICD-10-CM

## 2019-11-06 DIAGNOSIS — R293 Abnormal posture: Secondary | ICD-10-CM

## 2019-11-06 DIAGNOSIS — M25512 Pain in left shoulder: Secondary | ICD-10-CM

## 2019-11-06 NOTE — Therapy (Addendum)
[place] [address] [place], [place], [address] Phone: [phone]   Fax:  [phone] Progress Note Reporting Period  10/02/19 to 11/06/19 for the first 10 visits  See note below for Objective Data and Assessment of Progress/Goals.      Physical Therapy Treatment  Patient Details  Name: [name] MRN: [id number] Date of Birth: 10-16-1951 Referring Provider (PT): Dr. Theda Sers   Encounter Date: 11/06/2019  PT End of Session - 11/06/19 0843    Visit Number  10    Date for PT Re-Evaluation  11/20/19    PT Start Time  0800    PT Stop Time  0845    PT Time Calculation (min)  45 min    Activity Tolerance  Patient tolerated treatment well    Behavior During Therapy  [facility] for tasks assessed/performed       Past Medical History:  Diagnosis Date  . Asthma   . Back pain   . Diabetes mellitus    diet controlled   . Diverticular disease   . Environmental allergies   . History of diverticulitis of colon 05/17/2018   w/ perforation  s/p  sigmoid colectomy  . History of kidney stones   . Hyperlipemia   . Hypertension     Past Surgical History:  Procedure Laterality Date  . ACHILLES TENDON REPAIR Left 1974   ruptured  . bilateral cataract surgery     . COLON RESECTION N/A 05/17/2018   Procedure: EXPLORATORY LAPAROTOMY SIGMOID COLECTOMY AND  COLOSTOMY;  Surgeon: Armandina Gemma, MD;  [facility];  Service: General;  Laterality: N/A;  . COLONOSCOPY    . COLOSTOMY    . COLOSTOMY TAKEDOWN N/A 10/25/2018   Procedure: LAPAROSCOPIC COLOSTOMY REVERSAL;  Surgeon: Leighton Ruff, MD;  [facility];  Service: General;  Laterality: N/A;  . INCISIONAL HERNIA REPAIR N/A 04/14/2019   Procedure: OPEN  REPAIR INCISIONAL HERNIA WITH MESH Patch;  Surgeon: Armandina Gemma, MD;  [facility];  Service: General;  Laterality: N/A;  . KIDNEY STONE SURGERY    . KNEE ARTHROSCOPY Right   . SHOULDER ARTHROSCOPY W/ ROTATOR CUFF  REPAIR Right 12-24-2010    dr sypher @[facility]   . SHOULDER ARTHROSCOPY W/ SUBACROMIAL DECOMPRESSION AND DISTAL CLAVICLE EXCISION Left 03-28-2012   dr sypher  @[facility]    debridement  . TRIGGER FINGER RELEASE  11/17/2011   Procedure: RELEASE TRIGGER FINGER/A-1 PULLEY;  Surgeon: Cammie Sickle., MD;  Location: [place];  Service: Orthopedics;  Laterality: Left;  RELEASE LEFT LONG TRIGGER FINGER  . TUMOR REMOVAL     rt neck/chin    There were no vitals filed for this visit.  Subjective Assessment - 11/06/19 0804    Subjective  "i am doing good"    Currently in Pain?  No/denies         [facility] PT Assessment - 11/06/19 0001      AROM   Right Shoulder Flexion  180 Degrees    Right Shoulder ABduction  175 Degrees    Right Shoulder Internal Rotation  25 Degrees    Right Shoulder External Rotation  90 Degrees    Left Shoulder Flexion  108 Degrees    Left Shoulder ABduction  180 Degrees    Left Shoulder Internal Rotation  49 Degrees    Left Shoulder External Rotation  93 Degrees  [facility] Adult PT Treatment/Exercise - 11/06/19 0001      Shoulder Exercises: Standing   Internal Rotation  Theraband;20 reps;Both;Strengthening    Theraband Level (Shoulder Internal Rotation)  Level 3 (Green)    Flexion  Strengthening;20 reps;Weights;Both    Shoulder Flexion Weight (lbs)  4    ABduction  Strengthening;Weights;20 reps;Both    Shoulder ABduction Weight (lbs)  4    Other Standing Exercises  3 way ER abd 90 x10 each       Shoulder Exercises: ROM/Strengthening   UBE (Upper Arm Bike)  Constant work 25 wats 3 min each    Other ROM/Strengthening Exercises  Rows and lats 45lb 2x10     Other ROM/Strengthening Exercises  Chest press 25lb 2x10       Manual Therapy   Manual Therapy  Passive ROM;Soft tissue mobilization    Soft tissue mobilization  R deltoid    Passive ROM  Both UE main focue on IR               PT Short Term Goals - 10/14/19 7517       PT SHORT TERM GOAL #1   Title  Pt will be independent with initial HEP within 3 weeks for autonomy of care.    Status  Achieved      PT SHORT TERM GOAL #2   Title  Pt will demonstrate 150 degrees of L shoulder flexion w/decreased visible cording within 3 weeks in order to demonstrate improved functional ROM and decreased limitations related to cording.    Status  Partially Met        PT Long Term Goals - 11/06/19 0017      PT LONG TERM GOAL #3   Title  Pt will demonstrate improved posture within 6 weeks w/VC w/o tactile cueing for correct posture to demonstrate improved proprioception of the trunk/cervical spine.    Status  Partially Met            Plan - 11/06/19 0843    Clinical Impression Statement  Pt has progressed increasing his AROM in both shoulders overall. He does have some limitations and tightness with internal rotation in both UE's. No issues with the exercises, sone difficulty with external rotation using pronated grip. R shoulder PROM is very limited with internal rotation, some shoulder protraction noted with passive motions. L shoulder PROM IR is good.    Comorbidities  HTN, DM II, Bil shoulder surgery    Examination-Activity Limitations  Lift;Carry    Rehab Potential  Excellent    PT Frequency  2x / week    PT Treatment/Interventions  Cryotherapy;Electrical Stimulation;Iontophoresis 86m/ml Dexamethasone;Moist Heat;Functional mobility training;Therapeutic activities;Therapeutic exercise;Neuromuscular re-education;Patient/family education;Manual techniques    PT Next Visit Plan  will continue to focus on his RC and scapular stability       Patient will benefit from skilled therapeutic intervention in order to improve the following deficits and impairments:  Decreased range of motion, Decreased strength, Postural dysfunction, Pain  Visit Diagnosis: Pain in left arm  Abnormal posture  Chronic left shoulder pain  Acute pain of right  shoulder     Problem List Patient Active Problem List   Diagnosis Date Noted  . Ventral incisional hernia 04/14/2019  . Incisional hernia, without obstruction or gangrene 04/10/2019  . Pneumoperitoneum 05/17/2018  . Diverticulitis of colon with perforation s/p colectomy/ostomy 05/17/2018 05/17/2018  . S/P exploratory laparotomy 05/17/2018  . Diverticular disease   . Hypertension   . Hyperlipemia     RJori Moll  Rex Kras, PTA 11/06/2019, 8:47 AM  [place], [place], [address] Phone: [phone]   Fax:  [phone]  Name: [name] MRN: [id number] Date of Birth: Sep 18, 1951

## 2019-11-11 ENCOUNTER — Encounter: Payer: Self-pay | Admitting: Physical Therapy

## 2019-11-11 ENCOUNTER — Ambulatory Visit: Payer: Medicare Other | Admitting: Physical Therapy

## 2019-11-11 ENCOUNTER — Other Ambulatory Visit: Payer: Self-pay

## 2019-11-11 DIAGNOSIS — M79602 Pain in left arm: Secondary | ICD-10-CM | POA: Diagnosis not present

## 2019-11-11 DIAGNOSIS — R293 Abnormal posture: Secondary | ICD-10-CM

## 2019-11-11 DIAGNOSIS — M25512 Pain in left shoulder: Secondary | ICD-10-CM

## 2019-11-11 DIAGNOSIS — G8929 Other chronic pain: Secondary | ICD-10-CM

## 2019-11-11 DIAGNOSIS — M25511 Pain in right shoulder: Secondary | ICD-10-CM

## 2019-11-11 NOTE — Therapy (Signed)
[place], [place], [address] Phone: [phone]   Fax:  [phone]  Physical Therapy Treatment  Patient Details  Name: [name] MRN: [id number] Date of Birth: [birth year] Referring Provider (PT): Dr. Theda Sers   Encounter Date: 11/11/2019  PT End of Session - 11/11/19 1239    Visit Number  11    Number of Visits  13    Date for PT Re-Evaluation  11/20/19    PT Start Time  5974    PT Stop Time  1228    PT Time Calculation (min)  43 min    Activity Tolerance  Patient tolerated treatment well    Behavior During Therapy  [facility] for tasks assessed/performed       Past Medical History:  Diagnosis Date  . Asthma   . Back pain   . Diabetes mellitus    diet controlled   . Diverticular disease   . Environmental allergies   . History of diverticulitis of colon 05/17/2018   w/ perforation  s/p  sigmoid colectomy  . History of kidney stones   . Hyperlipemia   . Hypertension     Past Surgical History:  Procedure Laterality Date  . ACHILLES TENDON REPAIR Left 1974   ruptured  . bilateral cataract surgery     . COLON RESECTION N/A 05/17/2018   Procedure: EXPLORATORY LAPAROTOMY SIGMOID COLECTOMY AND  COLOSTOMY;  Surgeon: Armandina Gemma, MD;  [facility];  Service: General;  Laterality: N/A;  . COLONOSCOPY    . COLOSTOMY    . COLOSTOMY TAKEDOWN N/A 10/25/2018   Procedure: LAPAROSCOPIC COLOSTOMY REVERSAL;  Surgeon: Leighton Ruff, MD;  [facility];  Service: General;  Laterality: N/A;  . INCISIONAL HERNIA REPAIR N/A 04/14/2019   Procedure: OPEN  REPAIR INCISIONAL HERNIA WITH MESH Patch;  Surgeon: Armandina Gemma, MD;  [facility];  Service: General;  Laterality: N/A;  . KIDNEY STONE SURGERY    . KNEE ARTHROSCOPY Right   . SHOULDER ARTHROSCOPY W/ ROTATOR CUFF REPAIR Right 12-24-2010    dr sypher _0   . SHOULDER ARTHROSCOPY W/ SUBACROMIAL DECOMPRESSION AND DISTAL CLAVICLE EXCISION Left  03-28-2012   dr sypher  _1    debridement  . TRIGGER FINGER RELEASE  11/17/2011   Procedure: RELEASE TRIGGER FINGER/A-1 PULLEY;  Surgeon: Cammie Sickle., MD;  Location: [place];  Service: Orthopedics;  Laterality: Left;  RELEASE LEFT LONG TRIGGER FINGER  . TUMOR REMOVAL     rt neck/chin    There were no vitals filed for this visit.  Subjective Assessment - 11/11/19 1147    Subjective  "Doing good" "The stretching had my arm sore for a couple of days, but It went away"    Currently in Pain?  No/denies                       [facility] Adult PT Treatment/Exercise - 11/11/19 0001      Shoulder Exercises: Standing   Horizontal ABduction  20 reps;Theraband;Both    Theraband Level (Shoulder Horizontal ABduction)  Level 2 (Red)    External Rotation  Both;Weights;20 reps    External Rotation Weight (lbs)  5    Flexion  Strengthening;20 reps;Weights;Both    Shoulder Flexion Weight (lbs)  5    ABduction  Strengthening;Weights;20 reps;Both    Shoulder ABduction Weight (lbs)  4    Extension  Theraband;20 reps;Both;Strengthening    Extension Weight (lbs)  10  Other Standing Exercises  wall push ups 2x10     Other Standing Exercises  Probated curls 7lb 2x10, biceps curls 10lb 2x10      Shoulder Exercises: ROM/Strengthening   UBE (Upper Arm Bike)  Constant work 20 wats 3 min each    Other ROM/Strengthening Exercises  Rows and lats 45lb 2x10     Other ROM/Strengthening Exercises  Chest press 25lb 2x10      UE D2 Flexion bilat 3lb 2x10 each          PT Short Term Goals - 10/14/19 8916      PT SHORT TERM GOAL #1   Title  Pt will be independent with initial HEP within 3 weeks for autonomy of care.    Status  Achieved      PT SHORT TERM GOAL #2   Title  Pt will demonstrate 150 degrees of L shoulder flexion w/decreased visible cording within 3 weeks in order to demonstrate improved functional ROM and decreased limitations related to cording.     Status  Partially Met        PT Long Term Goals - 11/06/19 9450      PT LONG TERM GOAL #3   Title  Pt will demonstrate improved posture within 6 weeks w/VC w/o tactile cueing for correct posture to demonstrate improved proprioception of the trunk/cervical spine.    Status  Partially Met            Plan - 11/11/19 1240    Clinical Impression Statement  Pt did well today overall with good effort. No reports of increase pain. Progressed to the pulley with internal and external rotation. Some visible fatigue with D2 flexion. No issues with pronated biceps curls.    Personal Factors and Comorbidities  Behavior Pattern;Comorbidity 3+    Comorbidities  HTN, DM II, Bil shoulder surgery    Examination-Activity Limitations  Lift;Carry    Stability/Clinical Decision Making  Stable/Uncomplicated    Rehab Potential  Excellent    PT Frequency  2x / week    PT Treatment/Interventions  Cryotherapy;Electrical Stimulation;Iontophoresis 27m/ml Dexamethasone;Moist Heat;Functional mobility training;Therapeutic activities;Therapeutic exercise;Neuromuscular re-education;Patient/family education;Manual techniques    PT Next Visit Plan  will continue to focus on his RC and scapular stability, Assess sleepers stretch       Patient will benefit from skilled therapeutic intervention in order to improve the following deficits and impairments:  Decreased range of motion, Decreased strength, Postural dysfunction, Pain  Visit Diagnosis: Pain in left arm  Chronic left shoulder pain  Abnormal posture  Acute pain of right shoulder     Problem List Patient Active Problem List   Diagnosis Date Noted  . Ventral incisional hernia 04/14/2019  . Incisional hernia, without obstruction or gangrene 04/10/2019  . Pneumoperitoneum 05/17/2018  . Diverticulitis of colon with perforation s/p colectomy/ostomy 05/17/2018 05/17/2018  . S/P exploratory laparotomy 05/17/2018  . Diverticular disease   . Hypertension    . Hyperlipemia     RScot Jun PTA 11/11/2019, 12:42 PM  [address] [address]Phone: [phone]  Fax:  [phone] Name: Roger CarnMRN: [id number] of Birth: 602-22-1952

## 2019-11-13 ENCOUNTER — Ambulatory Visit: Payer: Medicare Other | Admitting: Physical Therapy

## 2019-11-13 ENCOUNTER — Encounter: Payer: Self-pay | Admitting: Physical Therapy

## 2019-11-13 ENCOUNTER — Other Ambulatory Visit: Payer: Self-pay

## 2019-11-13 DIAGNOSIS — G8929 Other chronic pain: Secondary | ICD-10-CM

## 2019-11-13 DIAGNOSIS — M25512 Pain in left shoulder: Secondary | ICD-10-CM

## 2019-11-13 DIAGNOSIS — M79602 Pain in left arm: Secondary | ICD-10-CM

## 2019-11-13 DIAGNOSIS — R293 Abnormal posture: Secondary | ICD-10-CM

## 2019-11-13 NOTE — Therapy (Signed)
[place], [place], [address] Phone: [phone]   Fax:  [phone]  Physical Therapy Treatment  Patient Details  Name: [name] MRN: [id number] Date of Birth: 1951-01-27 Referring Provider (PT): Dr. Theda Sers   Encounter Date: 11/13/2019  PT End of Session - 11/13/19 1059    Visit Number  12    Number of Visits  13    Date for PT Re-Evaluation  11/20/19    PT Start Time  1059    PT Stop Time  1141    PT Time Calculation (min)  42 min    Activity Tolerance  Patient tolerated treatment well    Behavior During Therapy  [facility] for tasks assessed/performed       Past Medical History:  Diagnosis Date  . Asthma   . Back pain   . Diabetes mellitus    diet controlled   . Diverticular disease   . Environmental allergies   . History of diverticulitis of colon 05/17/2018   w/ perforation  s/p  sigmoid colectomy  . History of kidney stones   . Hyperlipemia   . Hypertension     Past Surgical History:  Procedure Laterality Date  . ACHILLES TENDON REPAIR Left 1974   ruptured  . bilateral cataract surgery     . COLON RESECTION N/A 05/17/2018   Procedure: EXPLORATORY LAPAROTOMY SIGMOID COLECTOMY AND  COLOSTOMY;  Surgeon: Armandina Gemma, MD;  [facility];  Service: General;  Laterality: N/A;  . COLONOSCOPY    . COLOSTOMY    . COLOSTOMY TAKEDOWN N/A 10/25/2018   Procedure: LAPAROSCOPIC COLOSTOMY REVERSAL;  Surgeon: Leighton Ruff, MD;  [facility];  Service: General;  Laterality: N/A;  . INCISIONAL HERNIA REPAIR N/A 04/14/2019   Procedure: OPEN  REPAIR INCISIONAL HERNIA WITH MESH Patch;  Surgeon: Armandina Gemma, MD;  [facility];  Service: General;  Laterality: N/A;  . KIDNEY STONE SURGERY    . KNEE ARTHROSCOPY Right   . SHOULDER ARTHROSCOPY W/ ROTATOR CUFF REPAIR Right 12-24-2010    dr sypher _0   . SHOULDER ARTHROSCOPY W/ SUBACROMIAL DECOMPRESSION AND DISTAL CLAVICLE EXCISION Left  03-28-2012   dr sypher  _1    debridement  . TRIGGER FINGER RELEASE  11/17/2011   Procedure: RELEASE TRIGGER FINGER/A-1 PULLEY;  Surgeon: Cammie Sickle., MD;  Location: [place];  Service: Orthopedics;  Laterality: Left;  RELEASE LEFT LONG TRIGGER FINGER  . TUMOR REMOVAL     rt neck/chin    There were no vitals filed for this visit.  Subjective Assessment - 11/13/19 1100    Subjective  Pt reports his shoulder is doing well , still has trouble with bench pressing and that motion. He is able to raise his arm with shag dancing now    Patient Stated Goals  I want to lessen the pain in my arm especially when I am dancing.    Currently in Pain?  No/denies                       [facility] Adult PT Treatment/Exercise - 11/13/19 0001      Shoulder Exercises: Supine   Other Supine Exercises  hooklying, 10# each hand, slow count 4 reps, half time 10 reps then alternating 10 reps each. , Then lat  pull overs with 2 10# wts, VC to keep core engaged. skull crushers 3x80      Shoulder Exercises: Standing  Other Standing Exercises  3x10 mini lunge drawing bow and lowering arm, started with 4# decreased to 2# each side, VC for form     Other Standing Exercises  3x102# wts shoulder flex to horizontal abduction and lowering.       Shoulder Exercises: ROM/Strengthening   UBE (Upper Arm Bike)  Constant work 20 wats 3 min each      Shoulder Exercises: Stretch   Other Shoulder Stretches  hooklying, goal posts into scarecrow, shoulder flexion - flow between these motions. , then quadruped thoracic rotations into thread the needle.                PT Short Term Goals - 10/14/19 9604      PT SHORT TERM GOAL #1   Title  Pt will be independent with initial HEP within 3 weeks for autonomy of care.    Status  Achieved      PT SHORT TERM GOAL #2   Title  Pt will demonstrate 150 degrees of L shoulder flexion w/decreased visible cording within 3 weeks in order to  demonstrate improved functional ROM and decreased limitations related to cording.    Status  Partially Met        PT Long Term Goals - 11/06/19 5409      PT LONG TERM GOAL #3   Title  Pt will demonstrate improved posture within 6 weeks w/VC w/o tactile cueing for correct posture to demonstrate improved proprioception of the trunk/cervical spine.    Status  Partially Met            Plan - 11/13/19 1131    Clinical Impression Statement  Pt tolerated multi-directional shoulder strengthening well today.  Did report some posterior Rt shoulder pain with the bow drawing exercise, this decreased with lowering the resistance.  He is progressing per POC and should be for discharge to HEP    PT Frequency  2x / week    PT Duration  6 weeks    PT Treatment/Interventions  Cryotherapy;Electrical Stimulation;Iontophoresis 32m/ml Dexamethasone;Moist Heat;Functional mobility training;Therapeutic activities;Therapeutic exercise;Neuromuscular re-education;Patient/family education;Manual techniques    PT Next Visit Plan  finalize HEP and discharge if still doing well.    Consulted and Agree with Plan of Care  Patient       Patient will benefit from skilled therapeutic intervention in order to improve the following deficits and impairments:  Decreased range of motion, Decreased strength, Postural dysfunction, Pain  Visit Diagnosis: Pain in left arm  Chronic left shoulder pain  Abnormal posture     Problem List Patient Active Problem List   Diagnosis Date Noted  . Ventral incisional hernia 04/14/2019  . Incisional hernia, without obstruction or gangrene 04/10/2019  . Pneumoperitoneum 05/17/2018  . Diverticulitis of colon with perforation s/p colectomy/ostomy 05/17/2018 05/17/2018  . S/P exploratory laparotomy 05/17/2018  . Diverticular disease   . Hypertension   . Hyperlipemia     SJeral PinchPT  11/13/2019, 11:39 AM  [address] [place] [address]Phone: [phone]  Fax:  [phone] Name: Roger BlasiusMRN: [id number] of Birth: 605-Feb-1952

## 2019-11-17 ENCOUNTER — Other Ambulatory Visit: Payer: Self-pay

## 2019-11-17 ENCOUNTER — Ambulatory Visit: Payer: Medicare Other | Admitting: Physical Therapy

## 2019-11-17 ENCOUNTER — Encounter: Payer: Self-pay | Admitting: Physical Therapy

## 2019-11-17 DIAGNOSIS — M79602 Pain in left arm: Secondary | ICD-10-CM

## 2019-11-17 DIAGNOSIS — G8929 Other chronic pain: Secondary | ICD-10-CM

## 2019-11-17 DIAGNOSIS — M25511 Pain in right shoulder: Secondary | ICD-10-CM

## 2019-11-17 DIAGNOSIS — R293 Abnormal posture: Secondary | ICD-10-CM

## 2019-11-17 NOTE — Therapy (Signed)
[facility] [place], [place], [address] Phone: [phone]   Fax:  [phone]  Physical Therapy Treatment  Patient Details  Name: [name] MRN: [id number] Date of Birth: 1951-02-11 Referring Provider (PT): Dr. Theda Sers   Encounter Date: 11/17/2019  PT End of Session - 11/17/19 0836    Visit Number  13    Date for PT Re-Evaluation  11/20/19    PT Start Time  0759    PT Stop Time  0844    PT Time Calculation (min)  45 min    Activity Tolerance  Patient tolerated treatment well    Behavior During Therapy  [facility] for tasks assessed/performed       Past Medical History:  Diagnosis Date  . Asthma   . Back pain   . Diabetes mellitus    diet controlled   . Diverticular disease   . Environmental allergies   . History of diverticulitis of colon 05/17/2018   w/ perforation  s/p  sigmoid colectomy  . History of kidney stones   . Hyperlipemia   . Hypertension     Past Surgical History:  Procedure Laterality Date  . ACHILLES TENDON REPAIR Left 1974   ruptured  . bilateral cataract surgery     . COLON RESECTION N/A 05/17/2018   Procedure: EXPLORATORY LAPAROTOMY SIGMOID COLECTOMY AND  COLOSTOMY;  Surgeon: Armandina Gemma, MD;  Location: [facility];  Service: General;  Laterality: N/A;  . COLONOSCOPY    . COLOSTOMY    . COLOSTOMY TAKEDOWN N/A 10/25/2018   Procedure: LAPAROSCOPIC COLOSTOMY REVERSAL;  Surgeon: Leighton Ruff, MD;  Location: [facility];  Service: General;  Laterality: N/A;  . INCISIONAL HERNIA REPAIR N/A 04/14/2019   Procedure: OPEN  REPAIR INCISIONAL HERNIA WITH MESH Patch;  Surgeon: Armandina Gemma, MD;  Location: [facility];  Service: General;  Laterality: N/A;  . KIDNEY STONE SURGERY    . KNEE ARTHROSCOPY Right   . SHOULDER ARTHROSCOPY W/ ROTATOR CUFF REPAIR Right 12-24-2010    dr sypher @[facility]   . SHOULDER ARTHROSCOPY W/ SUBACROMIAL DECOMPRESSION AND DISTAL CLAVICLE EXCISION Left 03-28-2012   dr sypher   @[facility]    debridement  . TRIGGER FINGER RELEASE  11/17/2011   Procedure: RELEASE TRIGGER FINGER/A-1 PULLEY;  Surgeon: Cammie Sickle., MD;  Location: [place];  Service: Orthopedics;  Laterality: Left;  RELEASE LEFT LONG TRIGGER FINGER  . TUMOR REMOVAL     rt neck/chin    There were no vitals filed for this visit.  Subjective Assessment - 11/17/19 0804    Subjective  Doing well has had to remain with lighter weight and only a few things will cause the biceps issues    Currently in Pain?  No/denies                       [facility] Adult PT Treatment/Exercise - 11/17/19 0001      Shoulder Exercises: Seated   Other Seated Exercises  4# bent over extension, 2# reverse flies      Shoulder Exercises: Standing   Other Standing Exercises  20# farmers carry  each arm      Shoulder Exercises: ROM/Strengthening   UBE (Upper Arm Bike)  Constant work 35 wats 3 min each    Lat Pull  3.5 plate;20 reps;10 reps    Cybex Press  2.5 plate;20 reps    Cybex Press Limitations  serratus with 45#    Cybex Row  3.5 plate;20 reps;10 reps    Other ROM/Strengthening Exercises  10# unilateral overhead carry, 10# bird dog rows    Other ROM/Strengthening Exercises  triceps 45# 2x10, quadraped alternating wall holds               PT Short Term Goals - 10/14/19 3403      PT SHORT TERM GOAL #1   Title  Pt will be independent with initial HEP within 3 weeks for autonomy of care.    Status  Achieved      PT SHORT TERM GOAL #2   Title  Pt will demonstrate 150 degrees of L shoulder flexion w/decreased visible cording within 3 weeks in order to demonstrate improved functional ROM and decreased limitations related to cording.    Status  Partially Met        PT Long Term Goals - 11/17/19 0840      PT LONG TERM GOAL #1   Title  Pt will report 2/10 pain in the L biceps when dancing per pt report to demonstrate an improved quality of life in order to decrease risk for  immobility.    Status  Achieved      PT LONG TERM GOAL #2   Title  Pt will demonstrate 4+/5 L shoulder IR/ER within 6 weeks to demonstrate improved functional strength.    Status  Achieved      PT LONG TERM GOAL #3   Title  Pt will demonstrate improved posture within 6 weeks w/VC w/o tactile cueing for correct posture to demonstrate improved proprioception of the trunk/cervical spine.    Status  Achieved            Plan - 11/17/19 0837    Clinical Impression Statement  Patient doing very well, he does still have some issues with certain motions, but today no significant issues with pain and did not have to alter the motions, just cues for some posture and shoulder position    PT Next Visit Plan  D/C goals met    Consulted and Agree with Plan of Care  Patient       Patient will benefit from skilled therapeutic intervention in order to improve the following deficits and impairments:  Decreased range of motion, Decreased strength, Postural dysfunction, Pain  Visit Diagnosis: Pain in left arm  Chronic left shoulder pain  Abnormal posture  Acute pain of right shoulder     Problem List Patient Active Problem List   Diagnosis Date Noted  . Ventral incisional hernia 04/14/2019  . Incisional hernia, without obstruction or gangrene 04/10/2019  . Pneumoperitoneum 05/17/2018  . Diverticulitis of colon with perforation s/p colectomy/ostomy 05/17/2018 05/17/2018  . S/P exploratory laparotomy 05/17/2018  . Diverticular disease   . Hypertension   . Hyperlipemia     Sumner Boast., PT 11/17/2019, 8:42 AM  [facility], [place], [address] Phone: [phone]   Fax:  [phone]  Name: [name] MRN: [id number] Date of Birth: 1951-07-23

## 2019-11-19 ENCOUNTER — Ambulatory Visit: Payer: Medicare Other | Admitting: Physical Therapy

## 2019-12-30 ENCOUNTER — Other Ambulatory Visit: Payer: Self-pay

## 2019-12-30 ENCOUNTER — Emergency Department (HOSPITAL_BASED_OUTPATIENT_CLINIC_OR_DEPARTMENT_OTHER): Payer: Medicare Other

## 2019-12-30 ENCOUNTER — Emergency Department (HOSPITAL_BASED_OUTPATIENT_CLINIC_OR_DEPARTMENT_OTHER)
Admission: EM | Admit: 2019-12-30 | Discharge: 2019-12-30 | Disposition: A | Discharge status: Home / Self Care | Payer: Medicare Other | Attending: Emergency Medicine | Admitting: Emergency Medicine

## 2019-12-30 DIAGNOSIS — Z87891 Personal history of nicotine dependence: Secondary | ICD-10-CM | POA: Insufficient documentation

## 2019-12-30 DIAGNOSIS — J45909 Unspecified asthma, uncomplicated: Secondary | ICD-10-CM | POA: Diagnosis not present

## 2019-12-30 DIAGNOSIS — R0789 Other chest pain: Secondary | ICD-10-CM | POA: Diagnosis present

## 2019-12-30 DIAGNOSIS — I10 Essential (primary) hypertension: Secondary | ICD-10-CM | POA: Insufficient documentation

## 2019-12-30 DIAGNOSIS — E119 Type 2 diabetes mellitus without complications: Secondary | ICD-10-CM | POA: Diagnosis not present

## 2019-12-30 MED ORDER — METHOCARBAMOL 500 MG PO TABS: 500.0000 mg | ORAL_TABLET | Freq: Three times a day (TID) | ORAL | 0 refills | Status: DC | PRN

## 2019-12-30 MED ORDER — DICLOFENAC SODIUM 1 % EX GEL: 2.0000 g | Freq: Four times a day (QID) | CUTANEOUS | 0 refills | Status: AC

## 2019-12-30 MED FILL — DICLOFENAC SODIUM 1 % GEL: 1 | 13 days supply | Qty: 100 | Fill #0

## 2019-12-30 MED FILL — METHOCARBAMOL 500 MG TABS: 500 | 7 days supply | Qty: 20 | Fill #0

## 2019-12-30 NOTE — ED Provider Notes (Signed)
Emergency Department Provider Note   I have reviewed the triage vital signs and the nursing notes.   HISTORY  Chief Complaint Back Pain   HPI Usman Millett is a 69 y.o. male with PMH of HTN, HLD, and DM presents to the emergency department for evaluation of right-sided chest wall pain.   Patient states that he was having some discomfort in the right lateral chest and felt that he may have slept on it wrong or perhaps injured himself during his regular rowing exercises at home.  Yesterday he fell having his feet slide out from under him landing on his right side and had worsening of the pain.  He has tenderness with touching in the area and more severe pain when coughing.  He denies shortness of breath.  No radiation of symptoms or other modifying factors.  Has been trying over-the-counter medicines with no relief.  Past Medical History:  Diagnosis Date  . Asthma   . Back pain   . Diabetes mellitus    diet controlled   . Diverticular disease   . Environmental allergies   . History of diverticulitis of colon 05/17/2018   w/ perforation  s/p  sigmoid colectomy  . History of kidney stones   . Hyperlipemia   . Hypertension     Patient Active Problem List   Diagnosis Date Noted  . Ventral incisional hernia 04/14/2019  . Incisional hernia, without obstruction or gangrene 04/10/2019  . Pneumoperitoneum 05/17/2018  . Diverticulitis of colon with perforation s/p colectomy/ostomy 05/17/2018 05/17/2018  . S/P exploratory laparotomy 05/17/2018  . Diverticular disease   . Hypertension   . Hyperlipemia     Past Surgical History:  Procedure Laterality Date  . ACHILLES TENDON REPAIR Left 1974   ruptured  . bilateral cataract surgery     . COLON RESECTION N/A 05/17/2018   Procedure: EXPLORATORY LAPAROTOMY SIGMOID COLECTOMY AND  COLOSTOMY;  Surgeon: Darnell Level, MD;  Location: [facility];  Service: General;  Laterality: N/A;  . COLONOSCOPY    . COLOSTOMY    . COLOSTOMY TAKEDOWN N/A  10/25/2018   Procedure: LAPAROSCOPIC COLOSTOMY REVERSAL;  Surgeon: [name], MD;  Location[facility];  Service: General;  Laterality: N/A;  . INCISIONAL HERNIA REPAIR N/A 04/14/2019   Procedure: OPEN  REPAIR INCISIONAL HERNIA WITH MESH Patch;  Surgeon: Darnell Level, MD;  Location: [facility];  Service: General;  Laterality: N/A;  . KIDNEY STONE SURGERY    . KNEE ARTHROSCOPY Right   . SHOULDER ARTHROSCOPY W/ ROTATOR CUFF REPAIR Right 12-24-2010    dr sypher @[facility]   . SHOULDER ARTHROSCOPY W/ SUBACROMIAL DECOMPRESSION AND DISTAL CLAVICLE EXCISION Left 03-28-2012   dr sypher  @[facility]    debridement  . TRIGGER FINGER RELEASE  11/17/2011   Procedure: RELEASE TRIGGER FINGER/A-1 PULLEY;  Surgeon: ., MD;  Location: [facility];  Service: Orthopedics;  Laterality: Left;  RELEASE LEFT Colbey Wirtanen TRIGGER FINGER  . TUMOR REMOVAL     rt neck/chin    Allergies Hibiclens [chlorhexidine gluconate], Ciprofloxacin, and Flagyl [metronidazole]  No family history on file.  Social History Social History   Tobacco Use  . Smoking status: Former 11/19/2011  . Smokeless tobacco: Never Used  . Tobacco comment: never really was a smoker many years ago   Substance Use Topics  . Alcohol use: Never  . Drug use: No    Review of Systems  Constitutional: No fever/chills Cardiovascular: Positive right sided chest pain.  Respiratory: Denies shortness of breath. Musculoskeletal:  Negative for back pain.   10-point ROS otherwise negative.  ____________________________________________   PHYSICAL EXAM:  VITAL SIGNS: ED Triage Vitals  Enc Vitals Group     BP 12/30/19 0905 (!) 161/83     Pulse Rate 12/30/19 0905 61     Resp 12/30/19 0905 14     Temp 12/30/19 0905 98 F (36.7 C)     Temp Source 12/30/19 0905 Oral     SpO2 12/30/19 0905 100 %     Weight 12/30/19 0905 185 lb (83.9 kg)     Height 12/30/19 0905 5\' 8"  (1.727 m)   Constitutional: Alert and oriented. Well appearing and in no  acute distress. Eyes: Conjunctivae are normal.  Head: Atraumatic. Nose: No congestion/rhinnorhea. Mouth/Throat: Mucous membranes are moist.  Neck: No stridor.   Cardiovascular: Normal rate, regular rhythm. Good peripheral circulation. Grossly normal heart sounds.   Respiratory: Normal respiratory effort.  No retractions. Lungs CTAB. Gastrointestinal: No distention.  Musculoskeletal: No lower extremity tenderness nor edema. No gross deformities of extremities.  Point tenderness to the right lateral chest wall without bruising or crepitus which reproduces the patient's pain.  Neurologic:  Normal speech and language.  Skin:  Skin is warm, dry and intact. No rash noted.  ____________________________________________  RADIOLOGY  DG Ribs Unilateral W/Chest Right  Result Date: 12/30/2019 CLINICAL DATA:  RIGHT-sided chest pain EXAM: RIGHT RIBS AND CHEST - 3+ VIEW COMPARISON:  05/27/2018 FINDINGS: Normal mediastinum and cardiac silhouette. Normal pulmonary vasculature. No evidence of effusion, infiltrate, or pneumothorax. No acute bony abnormality. Dedicated views of the RIGHT ribs demonstrates no fracture IMPRESSION: No acute cardiopulmonary process.  No rib fracture. Electronically Signed   By: Suzy Bouchard M.D.   On: 12/30/2019 09:43    ____________________________________________   PROCEDURES  Procedure(s) performed:   Procedures  None  ____________________________________________   INITIAL IMPRESSION / ASSESSMENT AND PLAN / ED COURSE  Pertinent labs & imaging results that were available during my care of the patient were reviewed by me and considered in my medical decision making (see chart for details).   Patient presents to the emergency department with likely musculoskeletal pain in the right chest after fall.  No crepitus or ecchymosis to the right flank/chest wall.  This presentation is not consistent with ACS, PE, vascular pathology.  Plan for plain films with dedicated  rib films on the right to include the chest.  Doubt pneumothorax clinically with clear bilateral breath sounds and no hypoxemia. Able to easily reproduce the pain on exam.   Plain film reviewed. No fracture. Plan for pain mgmt and close PCP follow up.  ____________________________________________  FINAL CLINICAL IMPRESSION(S) / ED DIAGNOSES  Final diagnoses:  Right-sided chest wall pain    NEW OUTPATIENT MEDICATIONS STARTED DURING THIS VISIT:  Discharge Medication List as of 12/30/2019  9:56 AM    START taking these medications   Details  diclofenac Sodium (VOLTAREN) 1 % GEL Apply 2 g topically 4 (four) times daily., Starting Tue 12/30/2019, Normal    methocarbamol (ROBAXIN) 500 MG tablet Take 1 tablet (500 mg total) by mouth every 8 (eight) hours as needed for muscle spasms., Starting Tue 12/30/2019, Normal        Note:  This document was prepared using Dragon voice recognition software and may include unintentional dictation errors.  Nanda Quinton, MD, [facility] Emergency Medicine    Armarion Greek, [facility], MD 12/30/19 [phone]

## 2019-12-30 NOTE — ED Notes (Signed)
Pt verbalized understanding d/c instructions and use of incentive spirometer.

## 2019-12-30 NOTE — Discharge Instructions (Signed)
He was seen in the emergency department today with pain in the right chest.  I suspect that this is either muscle strain or possibly a slightly cracked rib.  Your x-ray did not show any obvious rib fracture but sometimes this can miss small breaks.  Please use the incentive spirometer at least once every hour while awake to keep your lungs nice and open.  You may take the Robaxin as needed for muscle spasm and more severe pain but it can cause drowsiness.  He should not drive a car while taking this.  You may take Tylenol and/or Motrin along with the topical medication provided.  Please follow with your primary care doctor and return to the emergency department any new or suddenly worsening symptoms.

## 2019-12-30 NOTE — ED Triage Notes (Signed)
Points to rt side post chest area, states he fell yesterday, fell onto grassy area. Has pain and tenderness. Pain increases with deep breaths and coughing

## 2020-01-12 ENCOUNTER — Ambulatory Visit: Payer: Medicare Other | Attending: Internal Medicine

## 2020-01-12 DIAGNOSIS — Z23 Encounter for immunization: Secondary | ICD-10-CM

## 2020-01-12 NOTE — Progress Notes (Signed)
   Covid-19 Vaccination Clinic  Name:  [name]    MRN: [id number] DOB: 08-17-1951  01/12/2020  [name] was observed post Covid-19 immunization for 15 minutes without incidence. He was provided with Vaccine Information Sheet and instruction to access the V-Safe system.   [name] was instructed to call 911 with any severe reactions post vaccine: Marland Kitchen Difficulty breathing  . Swelling of your face and throat  . A fast heartbeat  . A bad rash all over your body  . Dizziness and weakness    Immunizations Administered    Name Date Dose VIS Date Route   Pfizer COVID-19 Vaccine 01/12/2020  3:58 PM 0.3 mL 11/14/2019 Intramuscular   Manufacturer: [employer], [employer]   Lot: [id number]   NDC: [address]-3

## 2020-02-06 ENCOUNTER — Ambulatory Visit: Payer: Medicare Other | Attending: Internal Medicine

## 2020-02-06 DIAGNOSIS — Z23 Encounter for immunization: Secondary | ICD-10-CM | POA: Insufficient documentation

## 2020-02-06 NOTE — Progress Notes (Signed)
   Covid-19 Vaccination Clinic  Name:  Welby Montminy    MRN: [id number] DOB: Jul 16, 1951  02/06/2020  Mr. Tallerico was observed post Covid-19 immunization for 15 minutes without incident. He was provided with Vaccine Information Sheet and instruction to access the V-Safe system.   Mr. Clauson was instructed to call 911 with any severe reactions post vaccine: Marland Kitchen Difficulty breathing  . Swelling of face and throat  . A fast heartbeat  . A bad rash all over body  . Dizziness and weakness   Immunizations Administered    Name Date Dose VIS Date Route   Pfizer COVID-19 Vaccine 02/06/2020 12:45 PM 0.3 mL 11/14/2019 Intramuscular   Manufacturer: [employer], [employer]   Lot: [id number]   NDC: [address]-4

## 2020-04-09 IMAGING — CT CT ANGIO CHEST
2 of 7 series · 19 of 46 positions shown · IV contrast (ISOVUE)
Comparison: 05/17/2018

CLINICAL DATA: Right flank pain. History of perforated
diverticulitis with surgery last week. Currently has ostomy.

EXAM:
CT ANGIOGRAPHY CHEST WITH CONTRAST
TECHNIQUE: Multidetector CT imaging of the chest was performed using the
standard protocol during bolus administration of intravenous
contrast. Multiplanar CT image reconstructions and MIPs were
obtained to evaluate the vascular anatomy.
CONTRAST:  100mL G02RR5-XLO IOPAMIDOL (G02RR5-XLO) INJECTION 76%

[Series 5: thins · axial · 0.72mm/px · z∈[+1400,+1650]mm · 16 of 284 slices shown]
[im 17/284  lung]
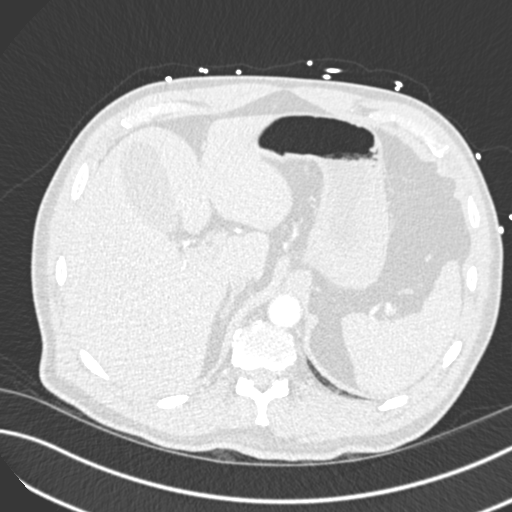
[im 34/284  soft-tissue]
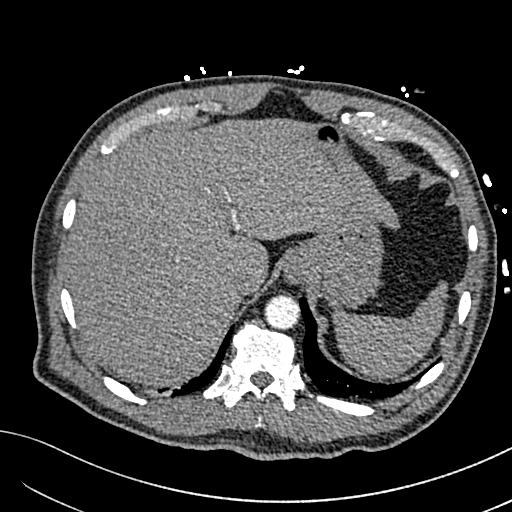
[im 50/284  lung]
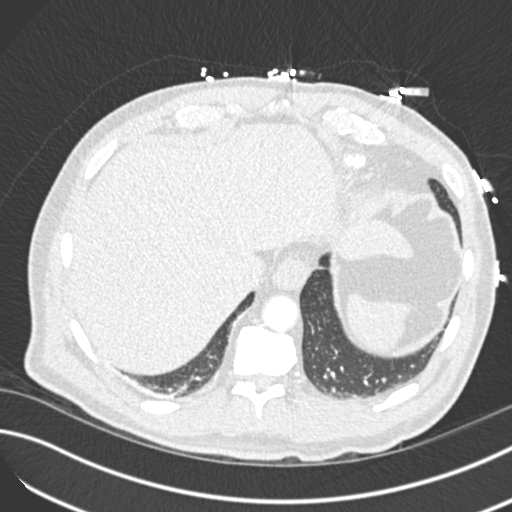
[im 67/284  soft-tissue]
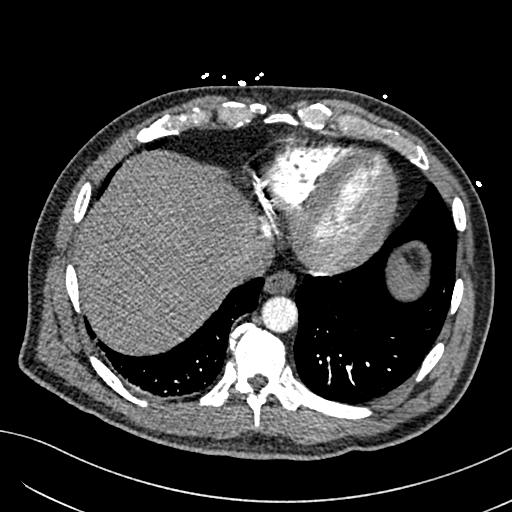
[im 84/284  lung]
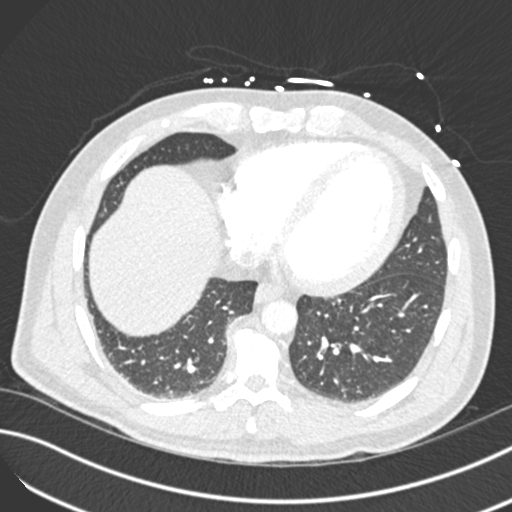
[im 100/284  soft-tissue]
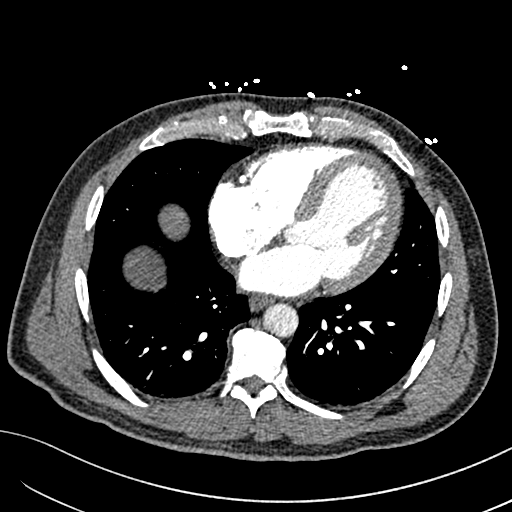
[im 117/284  lung]
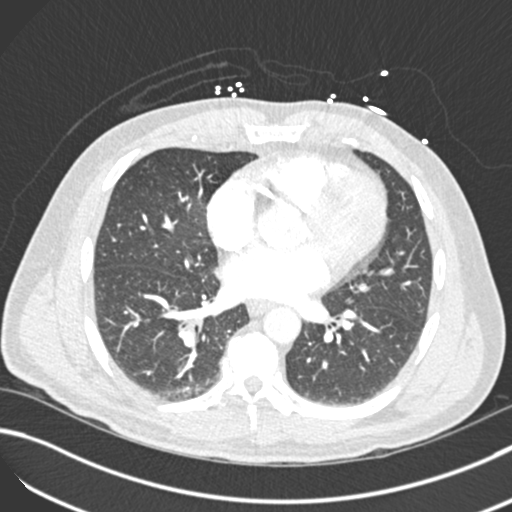
[im 134/284  soft-tissue]
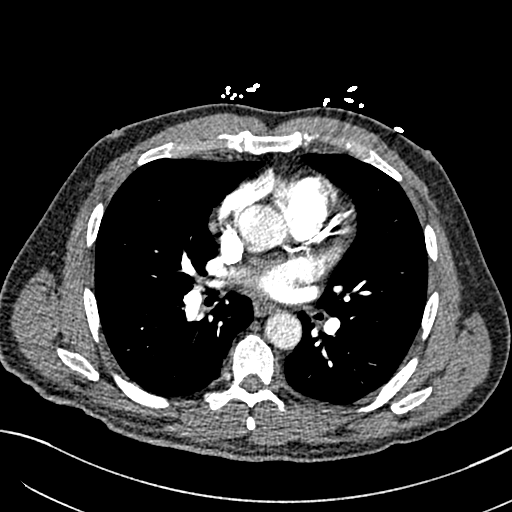
[im 150/284  lung]
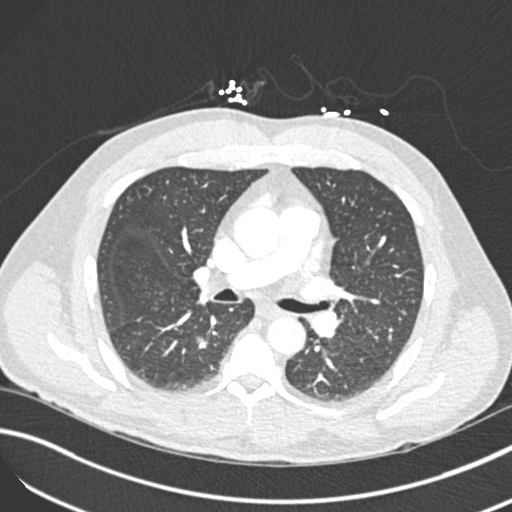
[im 167/284  soft-tissue]
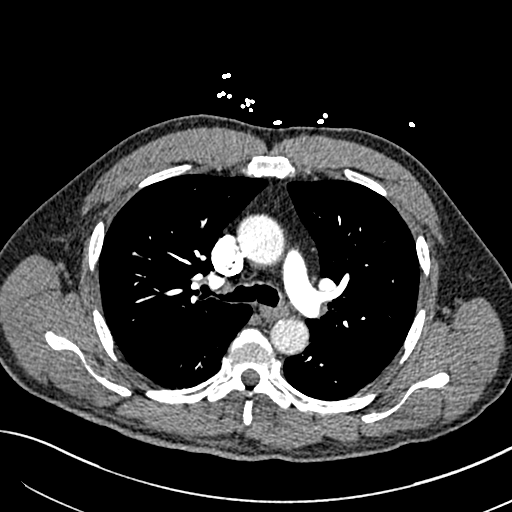
[im 184/284  lung]
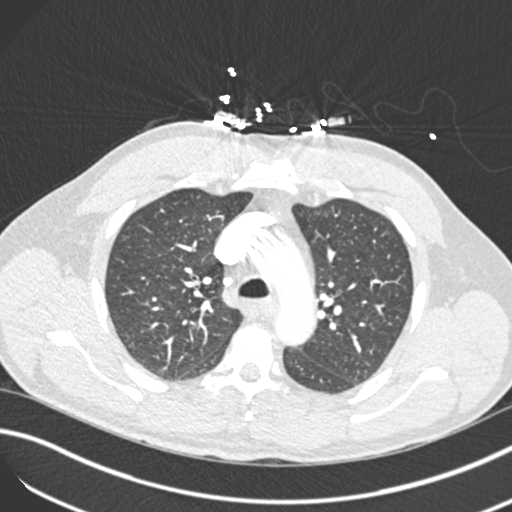
[im 200/284  soft-tissue]
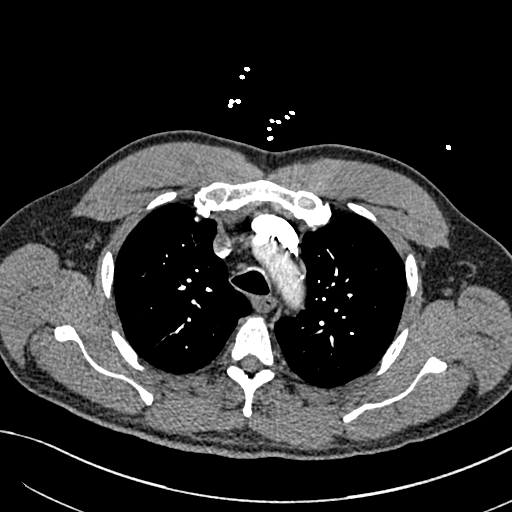
[im 217/284  lung]
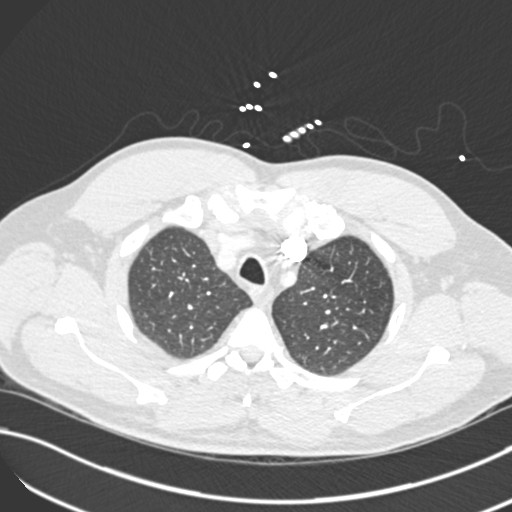
[im 234/284  soft-tissue]
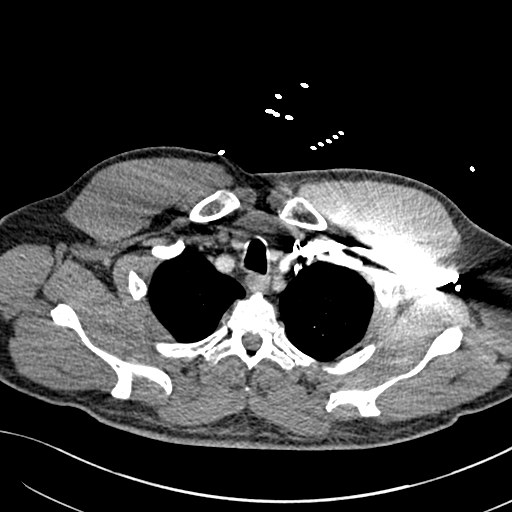
[im 250/284  lung]
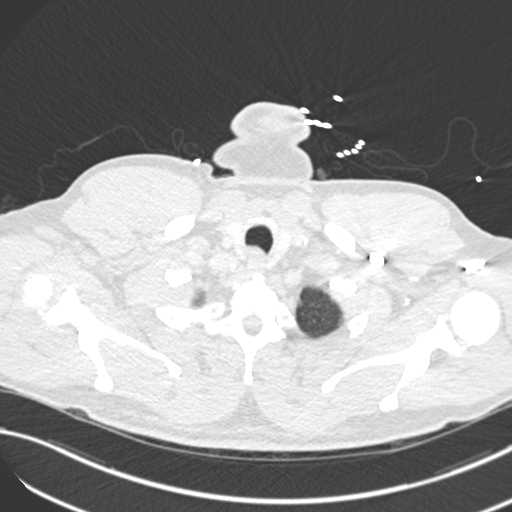
[im 267/284  soft-tissue]
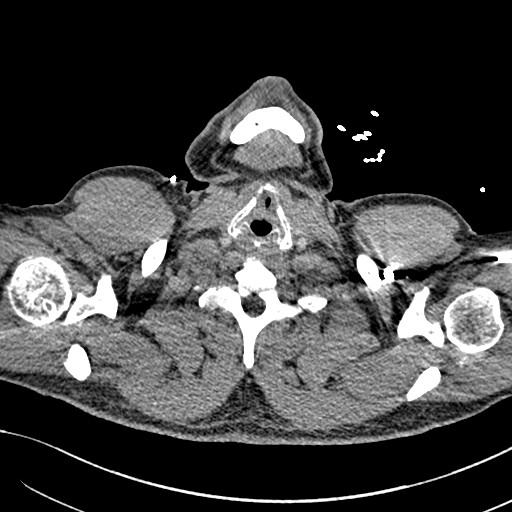

[Series 7: coronal mpr · coronal · 0.58mm/px · 3 of 145 slices shown]
[im 37/145  soft-tissue]
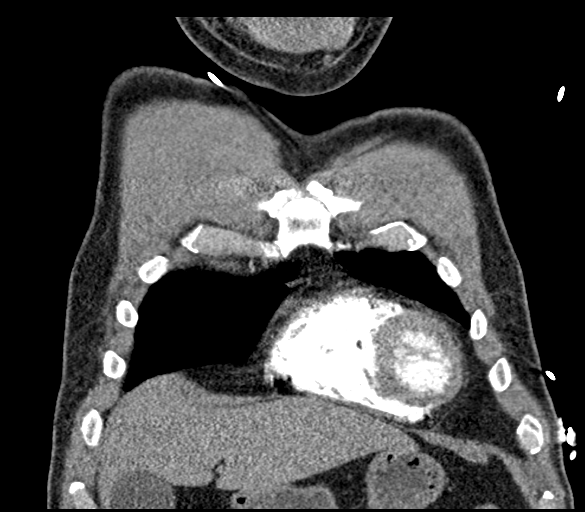
[im 73/145  soft-tissue]
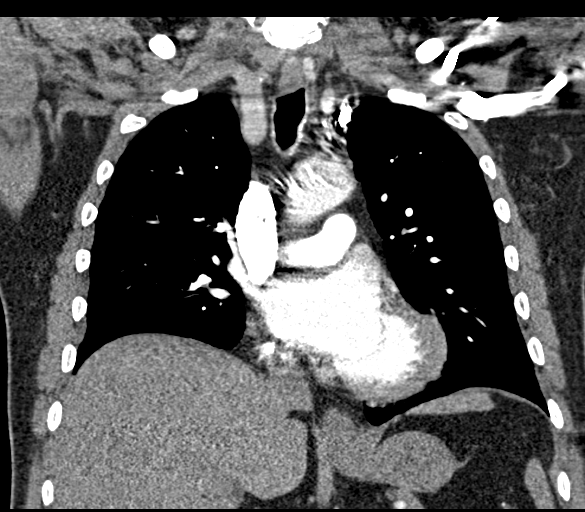
[im 109/145  soft-tissue]
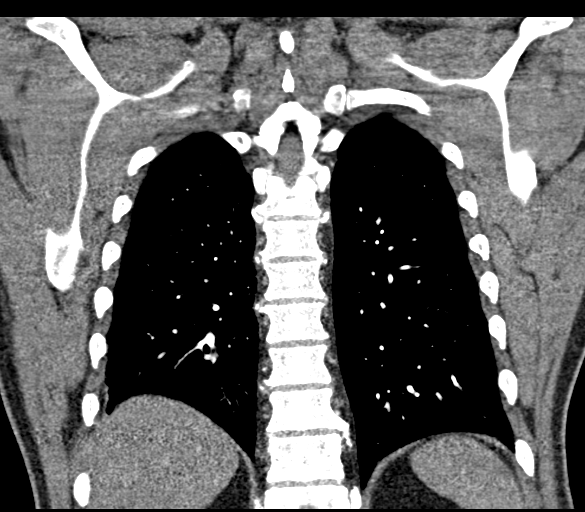

[19 of 46 positions shown; findings below may reference images not displayed]

FINDINGS: Cardiovascular: Conventional branch pattern of the great vessels.
Atherosclerotic nonaneurysmal thoracic aorta. Coronary
arteriosclerosis along the LAD. Top normal heart size without
pericardial effusion or thickening. No acute pulmonary embolus.

Mediastinum/Nodes: No enlarged mediastinal, hilar, or axillary lymph
nodes. Thyroid gland, trachea, and esophagus demonstrate no
significant findings.

Lungs/Pleura: Bibasilar atelectasis. No pulmonary consolidation,
effusion or pneumothorax. No dominant mass.

Upper Abdomen: No pneumoperitoneum identified in the upper abdomen.
No abnormal fluid collection. No acute abnormality identified.

Musculoskeletal: No chest wall abnormality. No acute or significant
osseous findings.

Review of the MIP images confirms the above findings.
IMPRESSION: 1. Coronary arteriosclerosis noted of the LAD.
2. Aortic atherosclerosis without aneurysm or dissection.
3. No acute pulmonary embolus.
4. No active pulmonary disease.

Aortic Atherosclerosis ([id number]).

## 2020-12-04 ENCOUNTER — Emergency Department (HOSPITAL_BASED_OUTPATIENT_CLINIC_OR_DEPARTMENT_OTHER)
Admission: EM | Admit: 2020-12-04 | Discharge: 2020-12-04 | Discharge status: Absent without leave | Payer: Medicare Other

## 2021-02-22 ENCOUNTER — Ambulatory Visit: Payer: Medicare Other

## 2021-05-23 ENCOUNTER — Ambulatory Visit: Payer: Medicare Other | Attending: Physician Assistant | Admitting: Rehabilitative and Restorative Service Providers"

## 2021-05-23 ENCOUNTER — Encounter: Payer: Self-pay | Admitting: Rehabilitative and Restorative Service Providers"

## 2021-05-23 ENCOUNTER — Other Ambulatory Visit: Payer: Self-pay

## 2021-05-23 DIAGNOSIS — M6281 Muscle weakness (generalized): Secondary | ICD-10-CM | POA: Diagnosis present

## 2021-05-23 DIAGNOSIS — M25511 Pain in right shoulder: Secondary | ICD-10-CM | POA: Insufficient documentation

## 2021-05-23 DIAGNOSIS — G8929 Other chronic pain: Secondary | ICD-10-CM | POA: Insufficient documentation

## 2021-05-23 DIAGNOSIS — M79602 Pain in left arm: Secondary | ICD-10-CM | POA: Diagnosis present

## 2021-05-23 DIAGNOSIS — R600 Localized edema: Secondary | ICD-10-CM | POA: Insufficient documentation

## 2021-05-23 DIAGNOSIS — M25512 Pain in left shoulder: Secondary | ICD-10-CM | POA: Insufficient documentation

## 2021-05-23 DIAGNOSIS — M25612 Stiffness of left shoulder, not elsewhere classified: Secondary | ICD-10-CM | POA: Diagnosis present

## 2021-05-23 DIAGNOSIS — R293 Abnormal posture: Secondary | ICD-10-CM | POA: Insufficient documentation

## 2021-05-23 NOTE — Patient Instructions (Signed)
Access Code: [email or web address] URL: [email or web address] Date: 05/23/2021 Prepared by: Clydie Braun Poppy Mcafee  Exercises Flexion-Extension Shoulder Pendulum with Table Support - 2-3 x daily - 7 x weekly - 2 sets - 10 reps Horizontal Shoulder Pendulum with Table Support - 2-3 x daily - 7 x weekly - 2 sets - 10 reps Seated Single Arm Bicep Curls with Rotation and Dumbbell - 2-3 x daily - 7 x weekly - 2 sets - 10 reps

## 2021-05-23 NOTE — Therapy (Signed)
[facility] [address] [place], [place], [address] Phone: [phone]   Fax:  [phone]  Physical Therapy Evaluation  Patient Details  Name: [name] MRN: [id number] Date of Birth: 11-04-1951 Referring Provider (PT): Dr Thomasena Edis   Encounter Date: 05/23/2021   PT End of Session - 05/23/21 1114     Visit Number 1    Date for PT Re-Evaluation 08/12/21    Authorization Type Medicare    PT Start Time 1015    PT Stop Time 1059    PT Time Calculation (min) 44 min    Activity Tolerance Patient tolerated treatment well             Past Medical History:  Diagnosis Date   Asthma    Back pain    Diabetes mellitus    diet controlled    Diverticular disease    Environmental allergies    History of diverticulitis of colon 05/17/2018   w/ perforation  s/p  sigmoid colectomy   History of kidney stones    Hyperlipemia    Hypertension     Past Surgical History:  Procedure Laterality Date   ACHILLES TENDON REPAIR Left 1974   ruptured   bilateral cataract surgery      COLON RESECTION N/A 05/17/2018   Procedure: EXPLORATORY LAPAROTOMY SIGMOID COLECTOMY AND  COLOSTOMY;  Surgeon: Darnell Level, MD;  Location: [facility];  Service: General;  Laterality: N/A;   COLONOSCOPY     COLOSTOMY     COLOSTOMY TAKEDOWN N/A 10/25/2018   Procedure: LAPAROSCOPIC COLOSTOMY REVERSAL;  Surgeon: [name], MD;  Location: [facility];  Service: General;  Laterality: N/A;   INCISIONAL HERNIA REPAIR N/A 04/14/2019   Procedure: OPEN  REPAIR INCISIONAL HERNIA WITH MESH Patch;  Surgeon: Darnell Level, MD;  Location: [facility];  Service: General;  Laterality: N/A;   KIDNEY STONE SURGERY     KNEE ARTHROSCOPY Right    SHOULDER ARTHROSCOPY W/ ROTATOR CUFF REPAIR Right 12-24-2010    dr sypher @[facility]    SHOULDER ARTHROSCOPY W/ SUBACROMIAL DECOMPRESSION AND DISTAL CLAVICLE EXCISION Left 03-28-2012   dr sypher  @[facility]    debridement   TRIGGER FINGER RELEASE  11/17/2011    Procedure: RELEASE TRIGGER FINGER/A-1 PULLEY;  Surgeon: ., MD;  Location: [facility];  Service: Orthopedics;  Laterality: Left;  RELEASE LEFT LONG TRIGGER FINGER   TUMOR REMOVAL     rt neck/chin    There were no vitals filed for this visit.    Subjective Assessment - 05/23/21 1020     Subjective Pt reports that he underwent R rotator cuff repair on 04/28/21 and just had his post op follow up last week to have stitches removed and received an order for PT to begin.    Limitations Lifting;House hold activities    Currently in Pain? Yes    Pain Score 4     Pain Location Shoulder    Pain Orientation Right    Pain Descriptors / Indicators Sharp    Pain Type Surgical pain    Pain Onset 1 to 4 weeks ago    Pain Frequency Intermittent                [facility] PT Assessment - 05/23/21 0001       Assessment   Medical Diagnosis s/p R rotator cuff repair on 04/28/21    Referring Provider (PT) Dr 05/25/21    Onset Date/Surgical Date 04/28/21    Hand Dominance  Right    Next MD Visit 06/07/2021      Precautions   Precautions Shoulder    Type of Shoulder Precautions See protocol    Required Braces or Orthoses Sling      Balance Screen   Has the patient fallen in the past 6 months No    Has the patient had a decrease in activity level because of a fear of falling?  No    Is the patient reluctant to leave their home because of a fear of falling?  No      Home Environment   Living Environment Private residence    Living Arrangements Children   autistic son   Type of Home House    Home Access Stairs to enter    Home Layout Multi-level    Additional Comments Rented a Metallurgist      Prior Function   Level of Independence Independent    Vocation Retired    Leisure Dancing, table tennis      Cognition   Overall Cognitive Status Within Functional Limits for tasks assessed      Observation/Other Assessments   Focus on Therapeutic Outcomes (FOTO)   23%, Risk adjusted 41%      ROM / Strength   AROM / PROM / Strength PROM;Strength      PROM   PROM Assessment Site Shoulder    Right/Left Shoulder Right    Right Shoulder Flexion 96 Degrees    Right Shoulder ABduction 105 Degrees    Right Shoulder Internal Rotation 65 Degrees    Right Shoulder External Rotation 5 Degrees      Strength   Overall Strength Comments LUE is 5/5, unable to assess R shoulder secondary to RTC protocol                        Objective measurements completed on examination: See above findings.       [facility] Adult PT Treatment/Exercise - 05/23/21 0001       Exercises   Exercises Shoulder      Shoulder Exercises: ROM/Strengthening   Pendulum flex/ext and abduction x10 each      Manual Therapy   Manual Therapy Passive ROM    Manual therapy comments in supine    Passive ROM in all glenohumeral planes of motion, per protocol                    PT Education - 05/23/21 1054     Education Details Pt provided with HEP    Person(s) Educated Patient    Methods Explanation;Demonstration;Handout    Comprehension Verbalized understanding;Returned demonstration              PT Short Term Goals - 05/23/21 1154       PT SHORT TERM GOAL #1   Title Pt will be independent with initial HEP.    Time 2    Period Weeks    Status New               PT Long Term Goals - 05/23/21 1157       PT LONG TERM GOAL #1   Title Pt will be independent with advanced HEP.    Time 12    Period Weeks    Status New      PT LONG TERM GOAL #2   Title Pt will increase R shoulder ROM to [facility] to allow him to reach overhead and to wash his back.  Time 12    Period Weeks    Status New      PT LONG TERM GOAL #3   Title Pt will increase R shoulder strength to at least 4+/5 to allow him to return to functional activites.    Time 12    Period Weeks    Status New      PT LONG TERM GOAL #4   Title Patient will be able to return to  dancing with pain no greater than 2/10.    Time 12    Period Weeks    Status New                    Plan - 05/23/21 1116     Clinical Impression Statement Patient presents with a referral from Dr Thomasena Edis s/p R rotator cuff surgical repair on 04/28/2021.  He reports that he had a previous RTC repair, but he reinjured his shoulder, so he required another surgery to repair.  Pt presents with decreased ROM, strength, difficulty with bed mobility, and increased pain.  He provides his rotator cuff repair protocol which is scanned into the system and he follows back up with Dr Thomasena Edis on 06/07/21.  He was educated on pendulum exercises and bicep curls without weight.  He would benefit from skilled PT to address his functional impairments and assist in progression through rotator cuff repair protocol.    Personal Factors and Comorbidities Comorbidity 2    Comorbidities DM, HTN, previous R RTC repair    Examination-Activity Limitations Bathing;Dressing;Bed Mobility;Transfers;Hygiene/Grooming;Sleep;Lift    Examination-Participation Restrictions Community Activity;Driving    Stability/Clinical Decision Making Stable/Uncomplicated    Clinical Decision Making Low    Rehab Potential Good    PT Frequency 2x / week    PT Duration 12 weeks    PT Treatment/Interventions ADLs/Self Care Home Management;Cryotherapy;Electrical Stimulation;Iontophoresis 4mg /ml Dexamethasone;Moist Heat;Ultrasound;Functional mobility training;Therapeutic activities;Therapeutic exercise;Neuromuscular re-education;Patient/family education;Manual techniques;Scar mobilization;Passive range of motion;Dry needling;Joint Manipulations;Taping    PT Next Visit Plan Progress per protocol    PT Home Exercise Plan Access Code: [email or web address]    Consulted and Agree with Plan of Care Patient             Patient will benefit from skilled therapeutic intervention in order to improve the following deficits and impairments:  Decreased range of  motion, Increased muscle spasms, Increased edema, Decreased strength, Impaired UE functional use, Pain, Postural dysfunction  Visit Diagnosis: Acute pain of right shoulder - Plan: PT plan of care cert/re-cert  Stiffness of left shoulder, not elsewhere classified - Plan: PT plan of care cert/re-cert  Abnormal posture - Plan: PT plan of care cert/re-cert  Muscle weakness (generalized) - Plan: PT plan of care cert/re-cert  Localized edema - Plan: PT plan of care cert/re-cert     Problem List Patient Active Problem List   Diagnosis Date Noted   Ventral incisional hernia 04/14/2019   Incisional hernia, without obstruction or gangrene 04/10/2019   Pneumoperitoneum 05/17/2018   Diverticulitis of colon with perforation s/p colectomy/ostomy 05/17/2018 05/17/2018   S/P exploratory laparotomy 05/17/2018   Diverticular disease    Hypertension    Hyperlipemia     05/19/2018, PT, DPT 05/23/2021, 12:13 PM  [facility] [address] [place], [place], [place] Phone: [phone]   Fax:  [phone]  Name: Keola Heninger MRN: Elenore Rota Date of Birth: 11-02-1951

## 2021-05-25 ENCOUNTER — Encounter: Payer: Medicare Other | Admitting: Rehabilitative and Restorative Service Providers"

## 2021-05-26 ENCOUNTER — Encounter: Payer: Self-pay | Admitting: Physical Therapy

## 2021-05-26 ENCOUNTER — Other Ambulatory Visit: Payer: Self-pay

## 2021-05-26 ENCOUNTER — Ambulatory Visit: Payer: Medicare Other | Admitting: Physical Therapy

## 2021-05-26 DIAGNOSIS — M25511 Pain in right shoulder: Secondary | ICD-10-CM

## 2021-05-26 DIAGNOSIS — R600 Localized edema: Secondary | ICD-10-CM

## 2021-05-26 DIAGNOSIS — M6281 Muscle weakness (generalized): Secondary | ICD-10-CM

## 2021-05-26 DIAGNOSIS — R293 Abnormal posture: Secondary | ICD-10-CM

## 2021-05-26 NOTE — Therapy (Signed)
[facility] [address] [place], [place], [address] Phone: [phone]   Fax:  [phone]  Physical Therapy Treatment  Patient Details  Name: [name] MRN: [id number] Date of Birth: Aug 12, 1951 Referring Provider (PT): Dr Thomasena Edis   Encounter Date: 05/26/2021   PT End of Session - 05/26/21 0916     Visit Number 2    Date for PT Re-Evaluation 08/12/21    Authorization Type Medicare    PT Start Time 0845    PT Stop Time 0930    PT Time Calculation (min) 45 min    Activity Tolerance Patient tolerated treatment well    Behavior During Therapy [facility] for tasks assessed/performed             Past Medical History:  Diagnosis Date   Asthma    Back pain    Diabetes mellitus    diet controlled    Diverticular disease    Environmental allergies    History of diverticulitis of colon 05/17/2018   w/ perforation  s/p  sigmoid colectomy   History of kidney stones    Hyperlipemia    Hypertension     Past Surgical History:  Procedure Laterality Date   ACHILLES TENDON REPAIR Left 1974   ruptured   bilateral cataract surgery      COLON RESECTION N/A 05/17/2018   Procedure: EXPLORATORY LAPAROTOMY SIGMOID COLECTOMY AND  COLOSTOMY;  Surgeon: Darnell Level, MD;  Location: [facility];  Service: General;  Laterality: N/A;   COLONOSCOPY     COLOSTOMY     COLOSTOMY TAKEDOWN N/A 10/25/2018   Procedure: LAPAROSCOPIC COLOSTOMY REVERSAL;  Surgeon: [name], MD;  [facility];  Service: General;  Laterality: N/A;   INCISIONAL HERNIA REPAIR N/A 04/14/2019   Procedure: OPEN  REPAIR INCISIONAL HERNIA WITH MESH Patch;  Surgeon: Darnell Level, MD;  [facility];  Service: General;  Laterality: N/A;   KIDNEY STONE SURGERY     KNEE ARTHROSCOPY Right    SHOULDER ARTHROSCOPY W/ ROTATOR CUFF REPAIR Right 12-24-2010    dr sypher @[facility]    SHOULDER ARTHROSCOPY W/ SUBACROMIAL DECOMPRESSION AND DISTAL CLAVICLE EXCISION Left 03-28-2012   dr sypher   @[facility]    debridement   TRIGGER FINGER RELEASE  11/17/2011   Procedure: RELEASE TRIGGER FINGER/A-1 PULLEY;  Surgeon: ., MD;  Location: [facility];  Service: Orthopedics;  Laterality: Left;  RELEASE LEFT LONG TRIGGER FINGER   TUMOR REMOVAL     rt neck/chin    There were no vitals filed for this visit.   Subjective Assessment - 05/26/21 0848     Subjective "Everything has been going good"    Currently in Pain? Yes    Pain Score 4     Pain Location Shoulder    Pain Orientation Right                               [facility] Adult PT Treatment/Exercise - 05/26/21 0001       Shoulder Exercises: Standing   Other Standing Exercises Ball on table Flex, CW, CCW      Shoulder Exercises: Pulleys   Flexion 2 minutes    Scaption 2 minutes    ABduction 1 minute      Shoulder Exercises: ROM/Strengthening   UBE (Upper Arm Bike) L1 x2 min each      Modalities   Modalities Vasopneumatic      Vasopneumatic  Number Minutes Vasopneumatic  10 minutes    Vasopnuematic Location  Shoulder    Vasopneumatic Pressure Low    Vasopneumatic Temperature  34      Manual Therapy   Manual therapy comments in supine    Passive ROM in all glenohumeral planes of motion, per protocol                      PT Short Term Goals - 05/23/21 1154       PT SHORT TERM GOAL #1   Title Pt will be independent with initial HEP.    Time 2    Period Weeks    Status New               PT Long Term Goals - 05/23/21 1157       PT LONG TERM GOAL #1   Title Pt will be independent with advanced HEP.    Time 12    Period Weeks    Status New      PT LONG TERM GOAL #2   Title Pt will increase R shoulder ROM to [facility] to allow him to reach overhead and to wash his back.    Time 12    Period Weeks    Status New      PT LONG TERM GOAL #3   Title Pt will increase R shoulder strength to at least 4+/5 to allow him to return to functional activites.     Time 12    Period Weeks    Status New      PT LONG TERM GOAL #4   Title Patient will be able to return to dancing with pain no greater than 2/10.    Time 12    Period Weeks    Status New                   Plan - 05/26/21 1027     Clinical Impression Statement Pt did well with a progression to passive and AAROM. Some pain and tightness at end range. Some postural compensation noted when using the UBE. Cues to prevent guarding with MT. Positive response to modality.    Personal Factors and Comorbidities Comorbidity 2    Comorbidities DM, HTN, previous R RTC repair    Examination-Activity Limitations Bathing;Dressing;Bed Mobility;Transfers;Hygiene/Grooming;Sleep;Lift    Examination-Participation Restrictions Community Activity;Driving    Stability/Clinical Decision Making Stable/Uncomplicated    Rehab Potential Good    PT Frequency 2x / week    PT Duration 12 weeks    PT Next Visit Plan Progress per protocol             Patient will benefit from skilled therapeutic intervention in order to improve the following deficits and impairments:  Decreased range of motion, Increased muscle spasms, Increased edema, Decreased strength, Impaired UE functional use, Pain, Postural dysfunction  Visit Diagnosis: Acute pain of right shoulder  Localized edema  Abnormal posture  Muscle weakness (generalized)     Problem List Patient Active Problem List   Diagnosis Date Noted   Ventral incisional hernia 04/14/2019   Incisional hernia, without obstruction or gangrene 04/10/2019   Pneumoperitoneum 05/17/2018   Diverticulitis of colon with perforation s/p colectomy/ostomy 05/17/2018 05/17/2018   S/P exploratory laparotomy 05/17/2018   Diverticular disease    Hypertension    Hyperlipemia     Grayce Sessions, PTA 05/26/2021, 9:20 AM  [facility] [address] [place], [place], [address]  Phone: [phone]   Fax:   [phone]  Name: [name] MRN: [id number] Date of Birth: 09-21-1951

## 2021-05-30 ENCOUNTER — Other Ambulatory Visit: Payer: Self-pay

## 2021-05-30 ENCOUNTER — Encounter: Payer: Self-pay | Admitting: Rehabilitative and Restorative Service Providers"

## 2021-05-30 ENCOUNTER — Ambulatory Visit: Payer: Medicare Other | Admitting: Rehabilitative and Restorative Service Providers"

## 2021-05-30 DIAGNOSIS — M25511 Pain in right shoulder: Secondary | ICD-10-CM | POA: Diagnosis not present

## 2021-05-30 DIAGNOSIS — R600 Localized edema: Secondary | ICD-10-CM

## 2021-05-30 DIAGNOSIS — R293 Abnormal posture: Secondary | ICD-10-CM

## 2021-05-30 DIAGNOSIS — M6281 Muscle weakness (generalized): Secondary | ICD-10-CM

## 2021-05-30 NOTE — Therapy (Signed)
[place]. [place], [place], [address] Phone: [phone]   Fax:  [phone]  Physical Therapy Treatment  Patient Details  Name: [name] MRN: [id number] Date of Birth: 11-07-1951 Referring Provider (PT): Dr Theda Sers   Encounter Date: 05/30/2021   PT End of Session - 05/30/21 1018     Visit Number 3    Date for PT Re-Evaluation 08/12/21    Authorization Type Medicare    PT Start Time 7867    PT Stop Time 1055    PT Time Calculation (min) 40 min    Activity Tolerance Patient tolerated treatment well    Behavior During Therapy [facility] for tasks assessed/performed             Past Medical History:  Diagnosis Date   Asthma    Back pain    Diabetes mellitus    diet controlled    Diverticular disease    Environmental allergies    History of diverticulitis of colon 05/17/2018   w/ perforation  s/p  sigmoid colectomy   History of kidney stones    Hyperlipemia    Hypertension     Past Surgical History:  Procedure Laterality Date   ACHILLES TENDON REPAIR Left 1974   ruptured   bilateral cataract surgery      COLON RESECTION N/A 05/17/2018   Procedure: EXPLORATORY LAPAROTOMY SIGMOID COLECTOMY AND  COLOSTOMY;  Surgeon: Armandina Gemma, MD;  [facility];  Service: General;  Laterality: N/A;   COLONOSCOPY     COLOSTOMY     COLOSTOMY TAKEDOWN N/A 10/25/2018   Procedure: LAPAROSCOPIC COLOSTOMY REVERSAL;  Surgeon: Leighton Ruff, MD;  Location: [facility];  Service: General;  Laterality: N/A;   [place] N/A 04/14/2019   Procedure: OPEN  REPAIR INCISIONAL HERNIA WITH MESH Patch;  Surgeon: Armandina Gemma, MD;  Location: [facility];  Service: General;  Laterality: N/A;   KIDNEY STONE SURGERY     KNEE ARTHROSCOPY Right    SHOULDER ARTHROSCOPY W/ ROTATOR CUFF REPAIR Right 12-24-2010    dr sypher _0    SHOULDER ARTHROSCOPY W/ SUBACROMIAL DECOMPRESSION AND DISTAL CLAVICLE EXCISION Left 03-28-2012   dr sypher   _1    debridement   TRIGGER FINGER RELEASE  11/17/2011   Procedure: RELEASE TRIGGER FINGER/A-1 PULLEY;  Surgeon: Cammie Sickle., MD;  Location: [place];  Service: Orthopedics;  Laterality: Left;  RELEASE LEFT LONG TRIGGER FINGER   TUMOR REMOVAL     rt neck/chin    There were no vitals filed for this visit.   Subjective Assessment - 05/30/21 1017     Subjective I only feel pain when I start to move my arm some.    Currently in Pain? Yes    Pain Score 3     Pain Location Shoulder    Pain Orientation Right    Pain Descriptors / Indicators Sore;Aching                [facility] PT Assessment - 05/30/21 0001       PROM   Right Shoulder Flexion 105 Degrees    Right Shoulder ABduction 100 Degrees                           [facility] Adult PT Treatment/Exercise - 05/30/21 0001       Shoulder Exercises: Supine   Flexion AAROM;Right;10 reps   with cane     Shoulder Exercises: Prone  Retraction AROM;Right;10 reps    Extension AROM;Right;10 reps      Shoulder Exercises: Standing   Other Standing Exercises Ball on table Flex, CW, CCW   x10 each   Other Standing Exercises Finger ladder for flexion and abduction x10 each      Shoulder Exercises: Pulleys   Flexion 2 minutes    Scaption 2 minutes    ABduction 1 minute      Shoulder Exercises: ROM/Strengthening   UBE (Upper Arm Bike) L1 x2 min each   with cuing to let LUE do the movement and the RUE go along with the motion     Manual Therapy   Manual Therapy Passive ROM    Manual therapy comments in supine    Passive ROM in all glenohumeral planes of motion, per protocol                      PT Short Term Goals - 05/30/21 1108       PT SHORT TERM GOAL #1   Title Pt will be independent with initial HEP.    Status Partially Met               PT Long Term Goals - 05/30/21 1108       PT LONG TERM GOAL #1   Title Pt will be independent with advanced HEP.     Status On-going      PT LONG TERM GOAL #2   Title Pt will increase R shoulder ROM to [facility] to allow him to reach overhead and to wash his back.    Status On-going      PT LONG TERM GOAL #3   Title Pt will increase R shoulder strength to at least 4+/5 to allow him to return to functional activites.    Status On-going      PT LONG TERM GOAL #4   Title Patient will be able to return to dancing with pain no greater than 2/10.    Status On-going                   Plan - 05/30/21 1105     Clinical Impression Statement Patient tolerated progression of protocol for R rotator cuff repair.  He continues to have pain and tightness at end range, especialy with AAROM compared with PROM.  He continues  to require cuing with muscle guarding.  He declines cold pack at the end of treatment stating that he will do at home, if needed.  He continues to require skilled PT to progress with protocol.    PT Treatment/Interventions ADLs/Self Care Home Management;Cryotherapy;Electrical Stimulation;Iontophoresis 44m/ml Dexamethasone;Moist Heat;Ultrasound;Functional mobility training;Therapeutic activities;Therapeutic exercise;Neuromuscular re-education;Patient/family education;Manual techniques;Scar mobilization;Passive range of motion;Dry needling;Joint Manipulations;Taping;Vasopneumatic Device    PT Next Visit Plan Progress per protocol    Consulted and Agree with Plan of Care Patient             Patient will benefit from skilled therapeutic intervention in order to improve the following deficits and impairments:  Decreased range of motion, Increased muscle spasms, Increased edema, Decreased strength, Impaired UE functional use, Pain, Postural dysfunction  Visit Diagnosis: Acute pain of right shoulder  Abnormal posture  Muscle weakness (generalized)  Localized edema     Problem List Patient Active Problem List   Diagnosis Date Noted   Ventral incisional hernia 04/14/2019   Incisional  hernia, without obstruction or gangrene 04/10/2019   Pneumoperitoneum 05/17/2018   Diverticulitis of colon with perforation s/p colectomy/ostomy  05/17/2018 05/17/2018   S/P exploratory laparotomy 05/17/2018   Diverticular disease    Hypertension    Hyperlipemia     Juel Burrow, PT, DPT 05/30/2021, 11:09 AM  [place]. [place], [place], [address] Phone: [phone]   Fax:  [phone]  Name: [name] MRN: [id number] Date of Birth: 08-Feb-1951

## 2021-06-01 ENCOUNTER — Encounter: Payer: Medicare Other | Admitting: Rehabilitative and Restorative Service Providers"

## 2021-06-02 ENCOUNTER — Encounter: Payer: Self-pay | Admitting: Physical Therapy

## 2021-06-02 ENCOUNTER — Ambulatory Visit: Payer: Medicare Other | Admitting: Physical Therapy

## 2021-06-02 ENCOUNTER — Other Ambulatory Visit: Payer: Self-pay

## 2021-06-02 DIAGNOSIS — M25511 Pain in right shoulder: Secondary | ICD-10-CM | POA: Diagnosis not present

## 2021-06-02 DIAGNOSIS — R600 Localized edema: Secondary | ICD-10-CM

## 2021-06-02 DIAGNOSIS — M6281 Muscle weakness (generalized): Secondary | ICD-10-CM

## 2021-06-02 DIAGNOSIS — G8929 Other chronic pain: Secondary | ICD-10-CM

## 2021-06-02 DIAGNOSIS — R293 Abnormal posture: Secondary | ICD-10-CM

## 2021-06-02 DIAGNOSIS — M79602 Pain in left arm: Secondary | ICD-10-CM

## 2021-06-02 DIAGNOSIS — M25612 Stiffness of left shoulder, not elsewhere classified: Secondary | ICD-10-CM

## 2021-06-02 NOTE — Therapy (Signed)
Roger. Camacho, [place], [address] Phone: [phone]   Fax:  [phone]  Physical Therapy Treatment  Patient Details  Name: [name] MRN: [id number] Date of Birth: 15-Jun-1951 Referring Provider (PT): Dr [name]   Encounter Date: 06/02/2021   PT End of Session - 06/02/21 0937     Visit Number 4    Date for PT Re-Evaluation 08/12/21    Authorization Type Medicare    PT Start Time 8588    PT Stop Time 0940    PT Time Calculation (min) 53 min    Activity Tolerance Patient tolerated treatment well    Behavior During Therapy [facility] for tasks assessed/performed             Past Medical History:  Diagnosis Date   Asthma    Back pain    Diabetes mellitus    diet controlled    Diverticular disease    Environmental allergies    History of diverticulitis of colon 05/17/2018   w/ perforation  s/p  sigmoid colectomy   History of kidney stones    Hyperlipemia    Hypertension     Past Surgical History:  Procedure Laterality Date   ACHILLES TENDON REPAIR Left 1974   ruptured   bilateral cataract surgery      COLON RESECTION N/A 05/17/2018   Procedure: EXPLORATORY LAPAROTOMY SIGMOID COLECTOMY AND  COLOSTOMY;  Surgeon: Armandina Gemma, MD;  Location: [facility];  Service: General;  Laterality: N/A;   COLONOSCOPY     COLOSTOMY     COLOSTOMY TAKEDOWN N/A 10/25/2018   Procedure: LAPAROSCOPIC COLOSTOMY REVERSAL;  Surgeon: Leighton Ruff, MD;  Location: [facility];  Service: General;  Laterality: N/A;   [place] N/A 04/14/2019   Procedure: OPEN  REPAIR INCISIONAL HERNIA WITH MESH Patch;  Surgeon: Armandina Gemma, MD;  Location: [facility];  Service: General;  Laterality: N/A;   KIDNEY STONE SURGERY     KNEE ARTHROSCOPY Right    SHOULDER ARTHROSCOPY W/ ROTATOR CUFF REPAIR Right 12-24-2010    dr sypher @[facility]    SHOULDER ARTHROSCOPY W/ SUBACROMIAL DECOMPRESSION AND DISTAL CLAVICLE EXCISION Left 03-28-2012   dr sypher   @[facility]    debridement   TRIGGER FINGER RELEASE  11/17/2011   Procedure: RELEASE TRIGGER FINGER/A-1 PULLEY;  Surgeon: Cammie Sickle., MD;  Location: [place];  Service: Orthopedics;  Laterality: Left;  RELEASE LEFT LONG TRIGGER FINGER   TUMOR REMOVAL     rt neck/chin    There were no vitals filed for this visit.   Subjective Assessment - 06/02/21 0847     Subjective Pt reports he is doing well. When he is using his shoulder he has a 4/10 pain.    Currently in Pain? Yes    Pain Score 4     Pain Location Shoulder                               [facility] Adult PT Treatment/Exercise - 06/02/21 0001       Shoulder Exercises: Supine   Flexion AAROM;Right;15 reps   with cane     Shoulder Exercises: Standing   Other Standing Exercises Ball on table Flex, CW, CCW   x10 each   Other Standing Exercises Finger ladder for flexion and abduction x15 each      Shoulder Exercises: ROM/Strengthening   UBE (Upper Arm Bike) L1x 3 min each  Vasopneumatic   Number Minutes Vasopneumatic  10 minutes    Vasopnuematic Location  Shoulder    Vasopneumatic Pressure Low    Vasopneumatic Temperature  34      Manual Therapy   Manual Therapy Passive ROM    Manual therapy comments in supine    Passive ROM in all glenohumeral planes of motion, per protocol                      PT Short Term Goals - 05/30/21 1108       PT SHORT TERM GOAL #1   Title Pt will be independent with initial HEP.    Status Partially Met               PT Long Term Goals - 05/30/21 1108       PT LONG TERM GOAL #1   Title Pt will be independent with advanced HEP.    Status On-going      PT LONG TERM GOAL #2   Title Pt will increase R shoulder ROM to [facility] to allow him to reach overhead and to wash his back.    Status On-going      PT LONG TERM GOAL #3   Title Pt will increase R shoulder strength to at least 4+/5 to allow him to return to functional activites.     Status On-going      PT LONG TERM GOAL #4   Title Patient will be able to return to dancing with pain no greater than 2/10.    Status On-going                   Plan - 06/02/21 0934     Clinical Impression Statement Pt tolerated progression of protocol well. He continues to have tightness in all motions at end ranges. Pt continues to require cueing for relaxing especially during PROM.    PT Treatment/Interventions ADLs/Self Care Home Management;Cryotherapy;Electrical Stimulation;Iontophoresis 60m/ml Dexamethasone;Moist Heat;Ultrasound;Functional mobility training;Therapeutic activities;Therapeutic exercise;Neuromuscular re-education;Patient/family education;Manual techniques;Scar mobilization;Passive range of motion;Dry needling;Joint Manipulations;Taping;Vasopneumatic Device    PT Next Visit Plan Progress per protocol    PT Home Exercise Plan Access Code: [email or web address]            Patient will benefit from skilled therapeutic intervention in order to improve the following deficits and impairments:  Decreased range of motion, Increased muscle spasms, Increased edema, Decreased strength, Impaired UE functional use, Pain, Postural dysfunction  Visit Diagnosis: Acute pain of right shoulder  Abnormal posture  Muscle weakness (generalized)  Localized edema  Stiffness of left shoulder, not elsewhere classified  Pain in left arm  Chronic left shoulder pain     Problem List Patient Active Problem List   Diagnosis Date Noted   Ventral incisional hernia 04/14/2019   Incisional hernia, without obstruction or gangrene 04/10/2019   Pneumoperitoneum 05/17/2018   Diverticulitis of colon with perforation s/p colectomy/ostomy 05/17/2018 05/17/2018   S/P exploratory laparotomy 05/17/2018   Diverticular disease    Hypertension    Hyperlipemia     EDawayne Cirri SPTA 06/02/2021, 9:38 AM  [place] [address]Phone: [phone]  Fax:  [phone] Name: LParks CzajkowskiMRN: [id number] of Birth: 611-17-1952

## 2021-06-07 ENCOUNTER — Ambulatory Visit: Payer: Medicare Other | Attending: Physician Assistant | Admitting: Physical Therapy

## 2021-06-07 ENCOUNTER — Encounter: Payer: Self-pay | Admitting: Physical Therapy

## 2021-06-07 ENCOUNTER — Other Ambulatory Visit: Payer: Self-pay

## 2021-06-07 DIAGNOSIS — M6281 Muscle weakness (generalized): Secondary | ICD-10-CM | POA: Insufficient documentation

## 2021-06-07 DIAGNOSIS — M25611 Stiffness of right shoulder, not elsewhere classified: Secondary | ICD-10-CM | POA: Insufficient documentation

## 2021-06-07 DIAGNOSIS — G8929 Other chronic pain: Secondary | ICD-10-CM | POA: Insufficient documentation

## 2021-06-07 DIAGNOSIS — R293 Abnormal posture: Secondary | ICD-10-CM | POA: Diagnosis present

## 2021-06-07 DIAGNOSIS — R600 Localized edema: Secondary | ICD-10-CM | POA: Insufficient documentation

## 2021-06-07 DIAGNOSIS — M25512 Pain in left shoulder: Secondary | ICD-10-CM | POA: Insufficient documentation

## 2021-06-07 DIAGNOSIS — M25511 Pain in right shoulder: Secondary | ICD-10-CM | POA: Diagnosis not present

## 2021-06-07 DIAGNOSIS — M79602 Pain in left arm: Secondary | ICD-10-CM | POA: Diagnosis present

## 2021-06-07 DIAGNOSIS — M25612 Stiffness of left shoulder, not elsewhere classified: Secondary | ICD-10-CM | POA: Insufficient documentation

## 2021-06-07 NOTE — Therapy (Signed)
[place]. [place], [place], [address] Phone: [phone]   Fax:  [phone]  Physical Therapy Treatment  Patient Details  Name: [name] MRN: [id number] Date of Birth: 1951/04/29 Referring Provider (PT): Dr Theda Sers   Encounter Date: 06/07/2021   PT End of Session - 06/07/21 1508     Visit Number 5    Date for PT Re-Evaluation 08/12/21    Authorization Type Medicare    PT Start Time 1426    PT Stop Time 1512    PT Time Calculation (min) 46 min    Activity Tolerance Patient tolerated treatment well    Behavior During Therapy [facility] for tasks assessed/performed             Past Medical History:  Diagnosis Date   Asthma    Back pain    Diabetes mellitus    diet controlled    Diverticular disease    Environmental allergies    History of diverticulitis of colon 05/17/2018   w/ perforation  s/p  sigmoid colectomy   History of kidney stones    Hyperlipemia    Hypertension     Past Surgical History:  Procedure Laterality Date   ACHILLES TENDON REPAIR Left 1974   ruptured   bilateral cataract surgery      COLON RESECTION N/A 05/17/2018   Procedure: EXPLORATORY LAPAROTOMY SIGMOID COLECTOMY AND  COLOSTOMY;  Surgeon: Armandina Gemma, MD;  [facility];  Service: General;  Laterality: N/A;   COLONOSCOPY     COLOSTOMY     COLOSTOMY TAKEDOWN N/A 10/25/2018   Procedure: LAPAROSCOPIC COLOSTOMY REVERSAL;  Surgeon: Leighton Ruff, MD;  [facility];  Service: General;  Laterality: N/A;   [place] N/A 04/14/2019   Procedure: OPEN  REPAIR INCISIONAL HERNIA WITH MESH Patch;  Surgeon: Armandina Gemma, MD;  [facility];  Service: General;  Laterality: N/A;   KIDNEY STONE SURGERY     KNEE ARTHROSCOPY Right    SHOULDER ARTHROSCOPY W/ ROTATOR CUFF REPAIR Right 12-24-2010    dr sypher _0    SHOULDER ARTHROSCOPY W/ SUBACROMIAL DECOMPRESSION AND DISTAL CLAVICLE EXCISION Left 03-28-2012   dr sypher   _1    debridement   TRIGGER FINGER RELEASE  11/17/2011   Procedure: RELEASE TRIGGER FINGER/A-1 PULLEY;  Surgeon: Cammie Sickle., MD;  Location: [place];  Service: Orthopedics;  Laterality: Left;  RELEASE LEFT LONG TRIGGER FINGER   TUMOR REMOVAL     rt neck/chin    There were no vitals filed for this visit.   Subjective Assessment - 06/07/21 1427     Subjective Pt reports he is doing well. He saw his surgeon today and his sling was removed.    Pain Score 3     Pain Location Shoulder    Pain Orientation Right                               [facility] Adult PT Treatment/Exercise - 06/07/21 0001       Shoulder Exercises: Supine   Flexion AAROM;Right;20 reps   with cane     Shoulder Exercises: Seated   Other Seated Exercises bicep curls 3# 2x10, tricep extension red theraband 2x10      Shoulder Exercises: Standing   Other Standing Exercises Ball on table Flex, CW, CCW   x10 each   Other Standing Exercises Finger ladder for flexion and abduction x10 each  Shoulder Exercises: ROM/Strengthening   UBE (Upper Arm Bike) L1x 3 min each      Vasopneumatic   Number Minutes Vasopneumatic  10 minutes    Vasopnuematic Location  Shoulder    Vasopneumatic Pressure Low    Vasopneumatic Temperature  34      Manual Therapy   Manual Therapy Passive ROM    Manual therapy comments in supine    Passive ROM in all glenohumeral planes of motion, per protocol                      PT Short Term Goals - 05/30/21 1108       PT SHORT TERM GOAL #1   Title Pt will be independent with initial HEP.    Status Partially Met               PT Long Term Goals - 06/07/21 1508       PT LONG TERM GOAL #1   Title Pt will be independent with advanced HEP.    Baseline 5/10 pain    Time 12    Period Weeks    Status On-going      PT LONG TERM GOAL #2   Title Pt will increase R shoulder ROM to [facility] to allow him to reach overhead and to wash  his back.    Baseline 4-/5 ER, 4/5 IR    Time 12    Period Weeks    Status On-going      PT LONG TERM GOAL #3   Title Pt will increase R shoulder strength to at least 4+/5 to allow him to return to functional activites.    Baseline significant fwd head posture and anteriorly rolled shoulders.    Time 12    Period Weeks    Status On-going      PT LONG TERM GOAL #4   Title Patient will be able to return to dancing with pain no greater than 2/10.    Time 12    Period Weeks    Status On-going                   Plan - 06/07/21 1505     Clinical Impression Statement Pt tolerated progression of protocol well. He continues to have tigthness in all motions at end ranges. He saw his surgeon today and got his sling off today. Pt still requires cueing for PROM to relax.    PT Treatment/Interventions ADLs/Self Care Home Management;Cryotherapy;Electrical Stimulation;Iontophoresis 49m/ml Dexamethasone;Moist Heat;Ultrasound;Functional mobility training;Therapeutic activities;Therapeutic exercise;Neuromuscular re-education;Patient/family education;Manual techniques;Scar mobilization;Passive range of motion;Dry needling;Joint Manipulations;Taping;Vasopneumatic Device    PT Next Visit Plan Progress per protocol    PT Home Exercise Plan Access Code: [email or web address]   Consulted and Agree with Plan of Care Patient             Patient will benefit from skilled therapeutic intervention in order to improve the following deficits and impairments:  Decreased range of motion, Increased muscle spasms, Increased edema, Decreased strength, Impaired UE functional use, Pain, Postural dysfunction  Visit Diagnosis: Acute pain of right shoulder  Abnormal posture  Muscle weakness (generalized)  Localized edema  Stiffness of left shoulder, not elsewhere classified  Pain in left arm  Chronic left shoulder pain     Problem List Patient Active Problem List   Diagnosis Date Noted   Ventral  incisional hernia 04/14/2019   Incisional hernia, without obstruction or gangrene 04/10/2019   Pneumoperitoneum 05/17/2018   Diverticulitis of  colon with perforation s/p colectomy/ostomy 05/17/2018 05/17/2018   S/P exploratory laparotomy 05/17/2018   Diverticular disease    Hypertension    Hyperlipemia     Dawayne Cirri, SPTA 06/07/2021, 3:11 PM  [place]. [place], [place], [address] Phone: [phone]   Fax:  [phone]  Name: [name] MRN: [id number] Date of Birth: 07-06-1951

## 2021-06-09 ENCOUNTER — Ambulatory Visit: Payer: Medicare Other | Admitting: Physical Therapy

## 2021-06-09 ENCOUNTER — Encounter: Payer: Self-pay | Admitting: Physical Therapy

## 2021-06-09 ENCOUNTER — Other Ambulatory Visit: Payer: Self-pay

## 2021-06-09 DIAGNOSIS — R600 Localized edema: Secondary | ICD-10-CM

## 2021-06-09 DIAGNOSIS — R293 Abnormal posture: Secondary | ICD-10-CM

## 2021-06-09 DIAGNOSIS — M25511 Pain in right shoulder: Secondary | ICD-10-CM

## 2021-06-09 DIAGNOSIS — M6281 Muscle weakness (generalized): Secondary | ICD-10-CM

## 2021-06-09 NOTE — Therapy (Signed)
[place]. [place], [place], [address] Phone: [phone]   Fax:  [phone]  Physical Therapy Treatment  Patient Details  Name: [name] MRN: [id number] Date of Birth: 12/29/1950 Referring Provider (PT): Dr Theda Sers   Encounter Date: 06/09/2021   PT End of Session - 06/09/21 0926     Visit Number 6    Date for PT Re-Evaluation 08/12/21    Authorization Type Medicare    PT Start Time 0845    PT Stop Time 0935    PT Time Calculation (min) 50 min    Activity Tolerance Patient tolerated treatment well    Behavior During Therapy [facility] for tasks assessed/performed             Past Medical History:  Diagnosis Date   Asthma    Back pain    Diabetes mellitus    diet controlled    Diverticular disease    Environmental allergies    History of diverticulitis of colon 05/17/2018   w/ perforation  s/p  sigmoid colectomy   History of kidney stones    Hyperlipemia    Hypertension     Past Surgical History:  Procedure Laterality Date   ACHILLES TENDON REPAIR Left 1974   ruptured   bilateral cataract surgery      COLON RESECTION N/A 05/17/2018   Procedure: EXPLORATORY LAPAROTOMY SIGMOID COLECTOMY AND  COLOSTOMY;  Surgeon: Armandina Gemma, MD;  [facility];  Service: General;  Laterality: N/A;   COLONOSCOPY     COLOSTOMY     COLOSTOMY TAKEDOWN N/A 10/25/2018   Procedure: LAPAROSCOPIC COLOSTOMY REVERSAL;  Surgeon: Leighton Ruff, MD;  [facility];  Service: General;  Laterality: N/A;   [place] N/A 04/14/2019   Procedure: OPEN  REPAIR INCISIONAL HERNIA WITH MESH Patch;  Surgeon: Armandina Gemma, MD;  [facility];  Service: General;  Laterality: N/A;   KIDNEY STONE SURGERY     KNEE ARTHROSCOPY Right    SHOULDER ARTHROSCOPY W/ ROTATOR CUFF REPAIR Right 12-24-2010    dr sypher @[facility]    SHOULDER ARTHROSCOPY W/ SUBACROMIAL DECOMPRESSION AND DISTAL CLAVICLE EXCISION Left 03-28-2012   dr sypher   @[facility]    debridement   TRIGGER FINGER RELEASE  11/17/2011   Procedure: RELEASE TRIGGER FINGER/A-1 PULLEY;  Surgeon: Cammie Sickle., MD;  Location: [place];  Service: Orthopedics;  Laterality: Left;  RELEASE LEFT LONG TRIGGER FINGER   TUMOR REMOVAL     rt neck/chin    There were no vitals filed for this visit.   Subjective Assessment - 06/09/21 0846     Subjective "Good"    Currently in Pain? Yes    Pain Score 4     Pain Location Shoulder    Pain Orientation Right                [facility] PT Assessment - 06/09/21 0001       ROM / Strength   AROM / PROM / Strength AROM      AROM   AROM Assessment Site Shoulder    Right/Left Shoulder Right    Right Shoulder Flexion 67 Degrees    Right Shoulder ABduction 65 Degrees                           [facility] Adult PT Treatment/Exercise - 06/09/21 0001       Shoulder Exercises: Standing   Flexion AROM;Right;10 reps;5 reps  on wall w/ pillowcase   Row Strengthening;Both;20 reps;Theraband    Theraband Level (Shoulder Row) Level 2 (Red)    Other Standing Exercises pillow case on wall RUE CW/CCW 2x10    Other Standing Exercises AAROM cane Flex, Ext, IR x10, 15lb tricep Ext 2x10      Shoulder Exercises: ROM/Strengthening   UBE (Upper Arm Bike) L1x 3 min    Nustep L5 x 4 min      Vasopneumatic   Number Minutes Vasopneumatic  10 minutes    Vasopnuematic Location  Shoulder    Vasopneumatic Pressure Medium    Vasopneumatic Temperature  34      Manual Therapy   Manual Therapy Joint mobilization;Passive ROM    Manual therapy comments in supine    Joint Mobilization R GH grade 2    Passive ROM in all glenohumeral planes of motion, per protocol                      PT Short Term Goals - 05/30/21 1108       PT SHORT TERM GOAL #1   Title Pt will be independent with initial HEP.    Status Partially Met               PT Long Term Goals - 06/07/21 1508       PT LONG TERM  GOAL #1   Title Pt will be independent with advanced HEP.    Baseline 5/10 pain    Time 12    Period Weeks    Status On-going      PT LONG TERM GOAL #2   Title Pt will increase R shoulder ROM to [facility] to allow him to reach overhead and to wash his back.    Baseline 4-/5 ER, 4/5 IR    Time 12    Period Weeks    Status On-going      PT LONG TERM GOAL #3   Title Pt will increase R shoulder strength to at least 4+/5 to allow him to return to functional activites.    Baseline significant fwd head posture and anteriorly rolled shoulders.    Time 12    Period Weeks    Status On-going      PT LONG TERM GOAL #4   Title Patient will be able to return to dancing with pain no greater than 2/10.    Time 12    Period Weeks    Status On-going                   Plan - 06/09/21 9163     Clinical Impression Statement Pt is 6 weeks post R RCR. Progressed with mor active and AAROM interventions. R shoulder motions is very limited. Postural cues needed with standing rows and extensions. Cues to relax with PROM. Passive motions has a tight end feel.    Personal Factors and Comorbidities Comorbidity 2    Comorbidities DM, HTN, previous R RTC repair    Examination-Activity Limitations Bathing;Dressing;Bed Mobility;Transfers;Hygiene/Grooming;Sleep;Lift    Examination-Participation Restrictions Community Activity;Driving    Rehab Potential Good    PT Frequency 2x / week    PT Treatment/Interventions ADLs/Self Care Home Management;Cryotherapy;Electrical Stimulation;Iontophoresis 49m/ml Dexamethasone;Moist Heat;Ultrasound;Functional mobility training;Therapeutic activities;Therapeutic exercise;Neuromuscular re-education;Patient/family education;Manual techniques;Scar mobilization;Passive range of motion;Dry needling;Joint Manipulations;Taping;Vasopneumatic Device    PT Next Visit Plan Progress per protocol             Patient will benefit from skilled therapeutic intervention in order to  improve the following deficits and impairments:  Decreased range of motion, Increased muscle spasms, Increased edema, Decreased strength, Impaired UE functional use, Pain, Postural dysfunction  Visit Diagnosis: Muscle weakness (generalized)  Acute pain of right shoulder  Localized edema  Abnormal posture     Problem List Patient Active Problem List   Diagnosis Date Noted   Ventral incisional hernia 04/14/2019   Incisional hernia, without obstruction or gangrene 04/10/2019   Pneumoperitoneum 05/17/2018   Diverticulitis of colon with perforation s/p colectomy/ostomy 05/17/2018 05/17/2018   S/P exploratory laparotomy 05/17/2018   Diverticular disease    Hypertension    Hyperlipemia     Scot Jun 06/09/2021, 9:29 AM  [place]. [place], [place], [address] Phone: [phone]   Fax:  [phone]  Name: Sir Mallis MRN: [id number] Date of Birth: 1951-04-27

## 2021-06-13 ENCOUNTER — Encounter: Payer: Self-pay | Admitting: Physical Therapy

## 2021-06-13 ENCOUNTER — Other Ambulatory Visit: Payer: Self-pay

## 2021-06-13 ENCOUNTER — Ambulatory Visit: Payer: Medicare Other | Admitting: Physical Therapy

## 2021-06-13 DIAGNOSIS — R293 Abnormal posture: Secondary | ICD-10-CM

## 2021-06-13 DIAGNOSIS — G8929 Other chronic pain: Secondary | ICD-10-CM

## 2021-06-13 DIAGNOSIS — R600 Localized edema: Secondary | ICD-10-CM

## 2021-06-13 DIAGNOSIS — M25511 Pain in right shoulder: Secondary | ICD-10-CM

## 2021-06-13 DIAGNOSIS — M25612 Stiffness of left shoulder, not elsewhere classified: Secondary | ICD-10-CM

## 2021-06-13 DIAGNOSIS — M79602 Pain in left arm: Secondary | ICD-10-CM

## 2021-06-13 DIAGNOSIS — M6281 Muscle weakness (generalized): Secondary | ICD-10-CM

## 2021-06-13 NOTE — Therapy (Signed)
[place]. [place], [place], [address] Phone: [phone]   Fax:  [phone]  Physical Therapy Treatment  Patient Details  Name: [name] MRN: [id number] Date of Birth: 02-Nov-1951 Referring Provider (PT): Dr Theda Sers   Encounter Date: 06/13/2021   PT End of Session - 06/13/21 1052     Visit Number 7    Date for PT Re-Evaluation 08/12/21    Authorization Type Medicare    PT Start Time 6712    PT Stop Time 1100    PT Time Calculation (min) 45 min             Past Medical History:  Diagnosis Date   Asthma    Back pain    Diabetes mellitus    diet controlled    Diverticular disease    Environmental allergies    History of diverticulitis of colon 05/17/2018   w/ perforation  s/p  sigmoid colectomy   History of kidney stones    Hyperlipemia    Hypertension     Past Surgical History:  Procedure Laterality Date   ACHILLES TENDON REPAIR Left 1974   ruptured   bilateral cataract surgery      COLON RESECTION N/A 05/17/2018   Procedure: EXPLORATORY LAPAROTOMY SIGMOID COLECTOMY AND  COLOSTOMY;  Surgeon: Armandina Gemma, MD;  Location: [facility];  Service: General;  Laterality: N/A;   COLONOSCOPY     COLOSTOMY     COLOSTOMY TAKEDOWN N/A 10/25/2018   Procedure: LAPAROSCOPIC COLOSTOMY REVERSAL;  Surgeon: Leighton Ruff, MD;  Location: [facility];  Service: General;  Laterality: N/A;   [place] N/A 04/14/2019   Procedure: OPEN  REPAIR INCISIONAL HERNIA WITH MESH Patch;  Surgeon: Armandina Gemma, MD;  Location: [facility];  Service: General;  Laterality: N/A;   KIDNEY STONE SURGERY     KNEE ARTHROSCOPY Right    SHOULDER ARTHROSCOPY W/ ROTATOR CUFF REPAIR Right 12-24-2010    dr sypher @[facility]    SHOULDER ARTHROSCOPY W/ SUBACROMIAL DECOMPRESSION AND DISTAL CLAVICLE EXCISION Left 03-28-2012   dr sypher  @[facility]    debridement   TRIGGER FINGER RELEASE  11/17/2011   Procedure: RELEASE TRIGGER FINGER/A-1 PULLEY;   Surgeon: Cammie Sickle., MD;  Location: [place];  Service: Orthopedics;  Laterality: Left;  RELEASE LEFT LONG TRIGGER FINGER   TUMOR REMOVAL     rt neck/chin    There were no vitals filed for this visit.   Subjective Assessment - 06/13/21 1036     Subjective Doing well, cant sleep on R shoulder. Increase pain with activity    Currently in Pain? Yes    Pain Score 2     Pain Location Shoulder    Pain Orientation Right                               [facility] Adult PT Treatment/Exercise - 06/13/21 0001       Shoulder Exercises: Supine   Flexion AAROM;Right;20 reps   cane     Shoulder Exercises: Seated   Extension 20 reps;Theraband    Theraband Level (Shoulder Extension) Level 1 (Yellow)    Row Theraband;20 reps;Strengthening;Both    Theraband Level (Shoulder Row) Level 1 (Yellow)    Other Seated Exercises R tricep ext green tband x10      Shoulder Exercises: Standing   Flexion AROM;10 reps;Right   towel up wall   Other Standing Exercises AROM flexion  and scaption x 10 each    Other Standing Exercises Finger ladder abduction x 10 RUE, 5lb biceps curls 2x10      Shoulder Exercises: ROM/Strengthening   UBE (Upper Arm Bike) L1x 3 min      Vasopneumatic   Number Minutes Vasopneumatic  10 minutes    Vasopnuematic Location  Shoulder    Vasopneumatic Pressure Medium    Vasopneumatic Temperature  34                      PT Short Term Goals - 05/30/21 1108       PT SHORT TERM GOAL #1   Title Pt will be independent with initial HEP.    Status Partially Met               PT Long Term Goals - 06/07/21 1508       PT LONG TERM GOAL #1   Title Pt will be independent with advanced HEP.    Baseline 5/10 pain    Time 12    Period Weeks    Status On-going      PT LONG TERM GOAL #2   Title Pt will increase R shoulder ROM to [facility] to allow him to reach overhead and to wash his back.    Baseline 4-/5 ER, 4/5 IR    Time 12     Period Weeks    Status On-going      PT LONG TERM GOAL #3   Title Pt will increase R shoulder strength to at least 4+/5 to allow him to return to functional activites.    Baseline significant fwd head posture and anteriorly rolled shoulders.    Time 12    Period Weeks    Status On-going      PT LONG TERM GOAL #4   Title Patient will be able to return to dancing with pain no greater than 2/10.    Time 12    Period Weeks    Status On-going                   Plan - 06/13/21 1052     Clinical Impression Statement Pt tolerated progression of exercises well. Continued with AROM and AAROM and pt is still limited in his R shoulder motion and reports pain in shoulder extension. Started scapular strengthening with rows and extension. Game ready used to help decrease pain following treatment.    Stability/Clinical Decision Making Stable/Uncomplicated    Rehab Potential Good    PT Frequency 2x / week    PT Duration 12 weeks    PT Treatment/Interventions ADLs/Self Care Home Management;Cryotherapy;Electrical Stimulation;Iontophoresis 26m/ml Dexamethasone;Moist Heat;Ultrasound;Functional mobility training;Therapeutic activities;Therapeutic exercise;Neuromuscular re-education;Patient/family education;Manual techniques;Scar mobilization;Passive range of motion;Dry needling;Joint Manipulations;Taping;Vasopneumatic Device    PT Next Visit Plan Progress per protocol    PT Home Exercise Plan Access Code: [email or web address]            Patient will benefit from skilled therapeutic intervention in order to improve the following deficits and impairments:  Decreased range of motion, Increased muscle spasms, Increased edema, Decreased strength, Impaired UE functional use, Pain, Postural dysfunction  Visit Diagnosis: Muscle weakness (generalized)  Abnormal posture  Chronic left shoulder pain  Acute pain of right shoulder  Stiffness of left shoulder, not elsewhere classified  Localized  edema  Pain in left arm     Problem List Patient Active Problem List   Diagnosis Date Noted   Ventral incisional hernia 04/14/2019  Incisional hernia, without obstruction or gangrene 04/10/2019   Pneumoperitoneum 05/17/2018   Diverticulitis of colon with perforation s/p colectomy/ostomy 05/17/2018 05/17/2018   S/P exploratory laparotomy 05/17/2018   Diverticular disease    Hypertension    Hyperlipemia     Scot Jun 06/13/2021, 10:56 AM  [place]. [place], [place], [address] Phone: [phone]   Fax:  [phone]  Name: Bert Ptacek MRN: [id number] Date of Birth: [birth year]

## 2021-06-16 ENCOUNTER — Other Ambulatory Visit: Payer: Self-pay

## 2021-06-16 ENCOUNTER — Ambulatory Visit: Payer: Medicare Other | Admitting: Physical Therapy

## 2021-06-16 ENCOUNTER — Encounter: Payer: Self-pay | Admitting: Physical Therapy

## 2021-06-16 DIAGNOSIS — M25511 Pain in right shoulder: Secondary | ICD-10-CM | POA: Diagnosis not present

## 2021-06-16 DIAGNOSIS — R293 Abnormal posture: Secondary | ICD-10-CM

## 2021-06-16 DIAGNOSIS — M6281 Muscle weakness (generalized): Secondary | ICD-10-CM

## 2021-06-16 DIAGNOSIS — R600 Localized edema: Secondary | ICD-10-CM

## 2021-06-16 NOTE — Therapy (Signed)
[place]. [place], [place], [address] Phone: [phone]   Fax:  [phone]  Physical Therapy Treatment  Patient Details  Name: [name] MRN: [id number] Date of Birth: May 03, 1951 Referring Provider (PT): Dr Theda Sers   Encounter Date: 06/16/2021   PT End of Session - 06/16/21 0922     Visit Number 8    Date for PT Re-Evaluation 08/12/21    Authorization Type Medicare    PT Start Time 0845    PT Stop Time 0935    PT Time Calculation (min) 50 min    Activity Tolerance Patient tolerated treatment well    Behavior During Therapy [facility] for tasks assessed/performed             Past Medical History:  Diagnosis Date   Asthma    Back pain    Diabetes mellitus    diet controlled    Diverticular disease    Environmental allergies    History of diverticulitis of colon 05/17/2018   w/ perforation  s/p  sigmoid colectomy   History of kidney stones    Hyperlipemia    Hypertension     Past Surgical History:  Procedure Laterality Date   ACHILLES TENDON REPAIR Left 1974   ruptured   bilateral cataract surgery      COLON RESECTION N/A 05/17/2018   Procedure: EXPLORATORY LAPAROTOMY SIGMOID COLECTOMY AND  COLOSTOMY;  Surgeon: Armandina Gemma, MD;  [facility];  Service: General;  Laterality: N/A;   COLONOSCOPY     COLOSTOMY     COLOSTOMY TAKEDOWN N/A 10/25/2018   Procedure: LAPAROSCOPIC COLOSTOMY REVERSAL;  Surgeon: Leighton Ruff, MD;  [facility];  Service: General;  Laterality: N/A;   [place] N/A 04/14/2019   Procedure: OPEN  REPAIR INCISIONAL HERNIA WITH MESH Patch;  Surgeon: Armandina Gemma, MD;  [facility];  Service: General;  Laterality: N/A;   KIDNEY STONE SURGERY     KNEE ARTHROSCOPY Right    SHOULDER ARTHROSCOPY W/ ROTATOR CUFF REPAIR Right 12-24-2010    dr sypher @[facility]    SHOULDER ARTHROSCOPY W/ SUBACROMIAL DECOMPRESSION AND DISTAL CLAVICLE EXCISION Left 03-28-2012   dr sypher   @[facility]    debridement   TRIGGER FINGER RELEASE  11/17/2011   Procedure: RELEASE TRIGGER FINGER/A-1 PULLEY;  Surgeon: Cammie Sickle., MD;  Location: [place];  Service: Orthopedics;  Laterality: Left;  RELEASE LEFT LONG TRIGGER FINGER   TUMOR REMOVAL     rt neck/chin    There were no vitals filed for this visit.   Subjective Assessment - 06/16/21 0845     Subjective doing ok, can move arm a little higher    Currently in Pain? Yes    Pain Score 3     Pain Location Shoulder    Pain Orientation Right                [facility] PT Assessment - 06/16/21 0001       AROM   Right Shoulder Flexion 86 Degrees    Right Shoulder ABduction 71 Degrees                           [facility] Adult PT Treatment/Exercise - 06/16/21 0001       Shoulder Exercises: Standing   External Rotation Right;20 reps;Theraband;Strengthening    Theraband Level (Shoulder External Rotation) Level 1 (Yellow)    Internal Rotation Strengthening;Right;20 reps;Theraband    Theraband  Level (Shoulder Internal Rotation) Level 1 (Yellow)    Flexion AROM;Right;10 reps   up wall w/ pillow case   ABduction Left;10 reps;AROM    Other Standing Exercises Finger ladder flex & abd x5      Shoulder Exercises: ROM/Strengthening   UBE (Upper Arm Bike) L1x 3 min each      Vasopneumatic   Number Minutes Vasopneumatic  10 minutes    Vasopnuematic Location  Shoulder    Vasopneumatic Pressure Medium    Vasopneumatic Temperature  34      Manual Therapy   Manual Therapy Joint mobilization;Passive ROM    Manual therapy comments in supine    Joint Mobilization R GH grade 2    Passive ROM in all glenohumeral planes of motion, per protocol                      PT Short Term Goals - 05/30/21 1108       PT SHORT TERM GOAL #1   Title Pt will be independent with initial HEP.    Status Partially Met               PT Long Term Goals - 06/07/21 1508       PT LONG TERM GOAL  #1   Title Pt will be independent with advanced HEP.    Baseline 5/10 pain    Time 12    Period Weeks    Status On-going      PT LONG TERM GOAL #2   Title Pt will increase R shoulder ROM to [facility] to allow him to reach overhead and to wash his back.    Baseline 4-/5 ER, 4/5 IR    Time 12    Period Weeks    Status On-going      PT LONG TERM GOAL #3   Title Pt will increase R shoulder strength to at least 4+/5 to allow him to return to functional activites.    Baseline significant fwd head posture and anteriorly rolled shoulders.    Time 12    Period Weeks    Status On-going      PT LONG TERM GOAL #4   Title Patient will be able to return to dancing with pain no greater than 2/10.    Time 12    Period Weeks    Status On-going                   Plan - 06/16/21 1324     Clinical Impression Statement Pt had a slight progression increasing his R shoulder AROM. Compensatory strategies note with all AROM interventions. Pt did well with R shoulder internal and external rotation. Some pain a the end range of PROM with tight end feel.    Personal Factors and Comorbidities Comorbidity 2    Comorbidities DM, HTN, previous R RTC repair    Examination-Activity Limitations Bathing;Dressing;Bed Mobility;Transfers;Hygiene/Grooming;Sleep;Lift    Examination-Participation Restrictions Community Activity;Driving    Stability/Clinical Decision Making Stable/Uncomplicated    Rehab Potential Good    PT Frequency 2x / week    PT Duration 12 weeks    PT Treatment/Interventions ADLs/Self Care Home Management;Cryotherapy;Electrical Stimulation;Iontophoresis 5m/ml Dexamethasone;Moist Heat;Ultrasound;Functional mobility training;Therapeutic activities;Therapeutic exercise;Neuromuscular re-education;Patient/family education;Manual techniques;Scar mobilization;Passive range of motion;Dry needling;Joint Manipulations;Taping;Vasopneumatic Device    PT Next Visit Plan Progress per protocol              Patient will benefit from skilled therapeutic intervention in order to improve the following deficits  and impairments:  Decreased range of motion, Increased muscle spasms, Increased edema, Decreased strength, Impaired UE functional use, Pain, Postural dysfunction  Visit Diagnosis: Abnormal posture  Muscle weakness (generalized)  Localized edema     Problem List Patient Active Problem List   Diagnosis Date Noted   Ventral incisional hernia 04/14/2019   Incisional hernia, without obstruction or gangrene 04/10/2019   Pneumoperitoneum 05/17/2018   Diverticulitis of colon with perforation s/p colectomy/ostomy 05/17/2018 05/17/2018   S/P exploratory laparotomy 05/17/2018   Diverticular disease    Hypertension    Hyperlipemia     Scot Jun 06/16/2021, 9:26 AM  [place]. [place], [place], [address] Phone: [phone]   Fax:  [phone]  Name: Yechiel Erny MRN: [id number] Date of Birth: [birth year]

## 2021-06-20 ENCOUNTER — Ambulatory Visit: Payer: Medicare Other | Admitting: Rehabilitative and Restorative Service Providers"

## 2021-06-20 ENCOUNTER — Other Ambulatory Visit: Payer: Self-pay

## 2021-06-20 ENCOUNTER — Encounter: Payer: Self-pay | Admitting: Rehabilitative and Restorative Service Providers"

## 2021-06-20 DIAGNOSIS — M6281 Muscle weakness (generalized): Secondary | ICD-10-CM

## 2021-06-20 DIAGNOSIS — M79602 Pain in left arm: Secondary | ICD-10-CM

## 2021-06-20 DIAGNOSIS — R293 Abnormal posture: Secondary | ICD-10-CM

## 2021-06-20 DIAGNOSIS — M25612 Stiffness of left shoulder, not elsewhere classified: Secondary | ICD-10-CM

## 2021-06-20 DIAGNOSIS — M25511 Pain in right shoulder: Secondary | ICD-10-CM | POA: Diagnosis not present

## 2021-06-20 DIAGNOSIS — R600 Localized edema: Secondary | ICD-10-CM

## 2021-06-20 DIAGNOSIS — G8929 Other chronic pain: Secondary | ICD-10-CM

## 2021-06-20 NOTE — Therapy (Signed)
[facility] [address]. [place], [place], [address] Phone: [phone]   Fax:  [phone]  Physical Therapy Treatment  Patient Details  Name: [name] MRN: [id number] Date of Birth: September 10, 1951 Referring Provider (PT): Dr [name]   Encounter Date: 06/20/2021   PT End of Session - 06/20/21 1040     Visit Number 9    Date for PT Re-Evaluation 08/12/21    Authorization Type Medicare    PT Start Time 0843    PT Stop Time 0935    PT Time Calculation (min) 52 min    Activity Tolerance Patient tolerated treatment well    Behavior During Therapy [facility] for tasks assessed/performed             Past Medical History:  Diagnosis Date   Asthma    Back pain    Diabetes mellitus    diet controlled    Diverticular disease    Environmental allergies    History of diverticulitis of colon 05/17/2018   w/ perforation  s/p  sigmoid colectomy   History of kidney stones    Hyperlipemia    Hypertension     Past Surgical History:  Procedure Laterality Date   ACHILLES TENDON REPAIR Left 1974   ruptured   bilateral cataract surgery      COLON RESECTION N/A 05/17/2018   Procedure: EXPLORATORY LAPAROTOMY SIGMOID COLECTOMY AND  COLOSTOMY;  Surgeon: Roger Level, MD;  Location: [facility];  Service: General;  Laterality: N/A;   COLONOSCOPY     COLOSTOMY     COLOSTOMY TAKEDOWN N/A 10/25/2018   Procedure: LAPAROSCOPIC COLOSTOMY REVERSAL;  Surgeon: Roger Levee, MD;  Location: [facility];  Service: General;  Laterality: N/A;   INCISIONAL HERNIA REPAIR N/A 04/14/2019   Procedure: OPEN  REPAIR INCISIONAL HERNIA WITH MESH Patch;  Surgeon: Roger Level, MD;  [facility]: [facility];  Service: General;  Laterality: N/A;   KIDNEY STONE SURGERY     KNEE ARTHROSCOPY Right    SHOULDER ARTHROSCOPY W/ ROTATOR CUFF REPAIR Right 12-24-2010    dr sypher @[facility]    SHOULDER ARTHROSCOPY W/ SUBACROMIAL DECOMPRESSION AND DISTAL CLAVICLE EXCISION Left 03-28-2012   dr sypher   @[facility]    debridement   TRIGGER FINGER RELEASE  11/17/2011   Procedure: RELEASE TRIGGER FINGER/A-1 PULLEY;  Surgeon: ., MD;  Location: [facility];  Service: Orthopedics;  Laterality: Left;  RELEASE LEFT LONG TRIGGER FINGER   TUMOR REMOVAL     rt neck/chin    There were no vitals filed for this visit.   Subjective Assessment - 06/20/21 1040     Subjective I am doing okay.                [facility] PT Assessment - 06/20/21 0001       Assessment   Medical Diagnosis s/p R rotator cuff repair on 04/28/21    Referring Provider (PT) Dr 06/22/21    Onset Date/Surgical Date 04/28/21      PROM   Right Shoulder Flexion 135 Degrees    Right Shoulder ABduction 125 Degrees                           [facility] Adult PT Treatment/Exercise - 06/20/21 0001       Shoulder Exercises: Seated   Flexion AAROM;Both;10 reps    Flexion Limitations with cane    Abduction AAROM;Right;10 reps      Shoulder Exercises: Standing  Flexion AROM;Right;10 reps    ABduction AROM;Right;10 reps    Other Standing Exercises Finger ladder flex & abd x10      Shoulder Exercises: Pulleys   Flexion 2 minutes    ABduction 2 minutes      Shoulder Exercises: Therapy Ball   Flexion Right;10 reps    Flexion Limitations small ball up wall    Other Therapy Ball Exercises ABCs with small ball on wall      Shoulder Exercises: ROM/Strengthening   UBE (Upper Arm Bike) L1x 3 min each way    Wall Pushups 20 reps    Wall Pushups Limitations 2x10    Other ROM/Strengthening Exercises Cone stacking to bottom shelf of cabinet and back to counter 2x10      Modalities   Modalities Vasopneumatic      Vasopneumatic   Number Minutes Vasopneumatic  10 minutes    Vasopnuematic Location  Shoulder    Vasopneumatic Pressure Medium    Vasopneumatic Temperature  34      Manual Therapy   Manual Therapy Joint mobilization;Passive ROM    Manual therapy comments in supine    Joint  Mobilization R GH grade 2    Passive ROM in all glenohumeral planes of motion, per protocol                      PT Short Term Goals - 06/20/21 1044       PT SHORT TERM GOAL #1   Title Pt will be independent with initial HEP.    Status Achieved               PT Long Term Goals - 06/20/21 1044       PT LONG TERM GOAL #1   Title Pt will be independent with advanced HEP.    Status On-going      PT LONG TERM GOAL #2   Title Pt will increase R shoulder ROM to [facility] to allow him to reach overhead and to wash his back.    Status On-going      PT LONG TERM GOAL #3   Title Pt will increase R shoulder strength to at least 4+/5 to allow him to return to functional activites.    Status On-going      PT LONG TERM GOAL #4   Title Patient will be able to return to dancing with pain no greater than 2/10.    Status On-going                   Plan - 06/20/21 1042     Clinical Impression Statement [name] continues to progress towards his goal related activities and improving in ROM.  He continues to demonstrate compensatory strategies with AROM interventions.  During end range PROM, he does have some pain that subsides when pt is at rest.  He continues to have relieve of pain with vaso.  He would benefit from continued skilled PT to progress with his R shoulder RTC repair protocol.    PT Treatment/Interventions ADLs/Self Care Home Management;Cryotherapy;Electrical Stimulation;Iontophoresis 4mg /ml Dexamethasone;Moist Heat;Ultrasound;Functional mobility training;Therapeutic activities;Therapeutic exercise;Neuromuscular re-education;Patient/family education;Manual techniques;Scar mobilization;Passive range of motion;Dry needling;Joint Manipulations;Taping;Vasopneumatic Device    PT Next Visit Plan Progress per protocol    Consulted and Agree with Plan of Care Patient             Patient will benefit from skilled therapeutic intervention in order to improve the  following deficits and impairments:  Decreased range of  motion, Increased muscle spasms, Increased edema, Decreased strength, Impaired UE functional use, Pain, Postural dysfunction  Visit Diagnosis: Abnormal posture  Muscle weakness (generalized)  Localized edema  Chronic left shoulder pain  Acute pain of right shoulder  Stiffness of left shoulder, not elsewhere classified  Pain in left arm     Problem List Patient Active Problem List   Diagnosis Date Noted   Ventral incisional hernia 04/14/2019   Incisional hernia, without obstruction or gangrene 04/10/2019   Pneumoperitoneum 05/17/2018   Diverticulitis of colon with perforation s/p colectomy/ostomy 05/17/2018 05/17/2018   S/P exploratory laparotomy 05/17/2018   Diverticular disease    Hypertension    Hyperlipemia     Reather Laurence, PT, DPT 06/20/2021, 10:58 AM  [facility] [address]. [place], [place], [address] Phone: [phone]   Fax:  [phone]  Name: [name] MRN: [id number] Date of Birth: May 18, 1951

## 2021-06-23 ENCOUNTER — Other Ambulatory Visit: Payer: Self-pay

## 2021-06-23 ENCOUNTER — Encounter: Payer: Self-pay | Admitting: Rehabilitative and Restorative Service Providers"

## 2021-06-23 ENCOUNTER — Ambulatory Visit: Payer: Medicare Other | Admitting: Rehabilitative and Restorative Service Providers"

## 2021-06-23 DIAGNOSIS — M79602 Pain in left arm: Secondary | ICD-10-CM

## 2021-06-23 DIAGNOSIS — M25612 Stiffness of left shoulder, not elsewhere classified: Secondary | ICD-10-CM

## 2021-06-23 DIAGNOSIS — M6281 Muscle weakness (generalized): Secondary | ICD-10-CM

## 2021-06-23 DIAGNOSIS — M25511 Pain in right shoulder: Secondary | ICD-10-CM | POA: Diagnosis not present

## 2021-06-23 DIAGNOSIS — M25512 Pain in left shoulder: Secondary | ICD-10-CM

## 2021-06-23 DIAGNOSIS — R293 Abnormal posture: Secondary | ICD-10-CM

## 2021-06-23 DIAGNOSIS — G8929 Other chronic pain: Secondary | ICD-10-CM

## 2021-06-23 NOTE — Therapy (Signed)
[facility] [address] [place], [place], [address] Phone: [phone]   Fax:  [phone]  Physical Therapy Treatment  Patient Details  Name: [name] MRN: [id number] Date of Birth: 05-01-1951 Referring Provider (PT): Dr Thomasena Edis  Progress Note Reporting Period 05/23/2021 to 06/23/2021  See note below for Objective Data and Assessment of Progress/Goals.      Encounter Date: 06/23/2021   PT End of Session - 06/23/21 0850     Visit Number 10    Date for PT Re-Evaluation 08/12/21    Authorization Type Medicare    PT Start Time 0846    PT Stop Time 0940    PT Time Calculation (min) 54 min    Activity Tolerance Patient tolerated treatment well    Behavior During Therapy [facility] for tasks assessed/performed             Past Medical History:  Diagnosis Date   Asthma    Back pain    Diabetes mellitus    diet controlled    Diverticular disease    Environmental allergies    History of diverticulitis of colon 05/17/2018   w/ perforation  s/p  sigmoid colectomy   History of kidney stones    Hyperlipemia    Hypertension     Past Surgical History:  Procedure Laterality Date   ACHILLES TENDON REPAIR Left 1974   ruptured   bilateral cataract surgery      COLON RESECTION N/A 05/17/2018   Procedure: EXPLORATORY LAPAROTOMY SIGMOID COLECTOMY AND  COLOSTOMY;  Surgeon: Darnell Level, MD;  Location: [facility];  Service: General;  Laterality: N/A;   COLONOSCOPY     COLOSTOMY     COLOSTOMY TAKEDOWN N/A 10/25/2018   Procedure: LAPAROSCOPIC COLOSTOMY REVERSAL;  Surgeon: [name], MD;  Location: [facility];  Service: General;  Laterality: N/A;   INCISIONAL HERNIA REPAIR N/A 04/14/2019   Procedure: OPEN  REPAIR INCISIONAL HERNIA WITH MESH Patch;  Surgeon: Darnell Level, MD;  Location: [facility];  Service: General;  Laterality: N/A;   KIDNEY STONE SURGERY     KNEE ARTHROSCOPY Right    SHOULDER ARTHROSCOPY W/ ROTATOR CUFF REPAIR Right  12-24-2010    dr sypher @[facility]    SHOULDER ARTHROSCOPY W/ SUBACROMIAL DECOMPRESSION AND DISTAL CLAVICLE EXCISION Left 03-28-2012   dr sypher  @[facility]    debridement   TRIGGER FINGER RELEASE  11/17/2011   Procedure: RELEASE TRIGGER FINGER/A-1 PULLEY;  Surgeon: ., MD;  Location: [facility];  Service: Orthopedics;  Laterality: Left;  RELEASE LEFT LONG TRIGGER FINGER   TUMOR REMOVAL     rt neck/chin    There were no vitals filed for this visit.   Subjective Assessment - 06/23/21 0849     Subjective I am having a hard time sleeping at night.  I stopped taking my pain medication, so I am hurting a little more.    Currently in Pain? Yes    Pain Score 4     Pain Location Shoulder    Pain Orientation Right    Pain Descriptors / Indicators Sore    Pain Type Surgical pain                [facility] PT Assessment - 06/23/21 0001       Assessment   Medical Diagnosis s/p R rotator cuff repair on 04/28/21    Referring Provider (PT) Dr 06/25/21    Onset Date/Surgical Date 04/28/21      AROM  Right Shoulder Flexion 85 Degrees    Right Shoulder ABduction 75 Degrees      PROM   Right Shoulder Flexion 140 Degrees    Right Shoulder ABduction 125 Degrees                           [facility] Adult PT Treatment/Exercise - 06/23/21 0001       Shoulder Exercises: Supine   Flexion AAROM;Right;20 reps   1# WaTe bar     Shoulder Exercises: Standing   External Rotation Right;20 reps;Theraband;Strengthening    Theraband Level (Shoulder External Rotation) Level 1 (Yellow)    Internal Rotation Strengthening;Right;20 reps;Theraband    Theraband Level (Shoulder Internal Rotation) Level 1 (Yellow)    Other Standing Exercises Finger ladder flex & abd x10      Shoulder Exercises: Pulleys   Flexion 2 minutes    ABduction 2 minutes      Shoulder Exercises: Therapy Ball   Flexion Right;10 reps    Flexion Limitations small ball up wall    Other Therapy Ball  Exercises ABCs with small ball on wall      Shoulder Exercises: ROM/Strengthening   UBE (Upper Arm Bike) L1x 3 min each way    Wall Pushups 20 reps    Wall Pushups Limitations 2x10    Other ROM/Strengthening Exercises Cone stacking to bottom shelf of cabinet and back to counter 2x10      Modalities   Modalities Vasopneumatic      Vasopneumatic   Number Minutes Vasopneumatic  10 minutes    Vasopnuematic Location  Shoulder    Vasopneumatic Pressure Medium    Vasopneumatic Temperature  34      Manual Therapy   Manual Therapy Joint mobilization;Passive ROM    Manual therapy comments in supine    Joint Mobilization R GH grade 2    Passive ROM in all glenohumeral planes of motion, per protocol                      PT Short Term Goals - 06/20/21 1044       PT SHORT TERM GOAL #1   Title Pt will be independent with initial HEP.    Status Achieved               PT Long Term Goals - 06/23/21 0943       PT LONG TERM GOAL #1   Title Pt will be independent with advanced HEP.    Status On-going      PT LONG TERM GOAL #2   Title Pt will increase R shoulder ROM to [facility] to allow him to reach overhead and to wash his back.    Status On-going      PT LONG TERM GOAL #3   Title Pt will increase R shoulder strength to at least 4+/5 to allow him to return to functional activites.    Status On-going      PT LONG TERM GOAL #4   Title Patient will be able to return to dancing with pain no greater than 2/10.    Status On-going                   Plan - 06/23/21 0936     Clinical Impression Statement [name] tolerated treatment session well and continues to progress towards goal related activities and improved ROM.  He does have compensatory strategies noted with AROM activities and some pain  with end range of range of motion noted.  He has done well with Rotator Cuff Repair protocol and continues to progress, as anticipated.  He did try not taking any medication  for his pain today, but was having increased pain noted during session, and was advised he may still need to take the OTC pain medication that he was recommended following surgery, he verbalizes his understanding.  He continues to have relief of pain with vaso following treatment session.  He continues to require skilled PT to progress towards goal related activities.    PT Frequency 2x / week    PT Duration 12 weeks    PT Treatment/Interventions ADLs/Self Care Home Management;Cryotherapy;Electrical Stimulation;Iontophoresis 4mg /ml Dexamethasone;Moist Heat;Ultrasound;Functional mobility training;Therapeutic activities;Therapeutic exercise;Neuromuscular re-education;Patient/family education;Manual techniques;Scar mobilization;Passive range of motion;Dry needling;Joint Manipulations;Taping;Vasopneumatic Device    PT Next Visit Plan Progress per protocol    Consulted and Agree with Plan of Care Patient             Patient will benefit from skilled therapeutic intervention in order to improve the following deficits and impairments:  Decreased range of motion, Increased muscle spasms, Increased edema, Decreased strength, Impaired UE functional use, Pain, Postural dysfunction  Visit Diagnosis: Acute pain of right shoulder  Chronic left shoulder pain  Muscle weakness (generalized)  Abnormal posture  Stiffness of left shoulder, not elsewhere classified  Pain in left arm     Problem List Patient Active Problem List   Diagnosis Date Noted   Ventral incisional hernia 04/14/2019   Incisional hernia, without obstruction or gangrene 04/10/2019   Pneumoperitoneum 05/17/2018   Diverticulitis of colon with perforation s/p colectomy/ostomy 05/17/2018 05/17/2018   S/P exploratory laparotomy 05/17/2018   Diverticular disease    Hypertension    Hyperlipemia     05/19/2018, PT, DPT 06/23/2021, 9:48 AM  [facility] [place] [address] [place], [place], [place] Phone: [phone]   Fax:  [phone]  Name: [name] MRN: Elenore Rota Date of Birth: Jan 28, 1951

## 2021-06-27 ENCOUNTER — Other Ambulatory Visit: Payer: Self-pay

## 2021-06-27 ENCOUNTER — Ambulatory Visit: Payer: Medicare Other | Admitting: Physical Therapy

## 2021-06-27 ENCOUNTER — Encounter: Payer: Self-pay | Admitting: Physical Therapy

## 2021-06-27 DIAGNOSIS — M25511 Pain in right shoulder: Secondary | ICD-10-CM

## 2021-06-27 DIAGNOSIS — R600 Localized edema: Secondary | ICD-10-CM

## 2021-06-27 DIAGNOSIS — M79602 Pain in left arm: Secondary | ICD-10-CM

## 2021-06-27 DIAGNOSIS — M6281 Muscle weakness (generalized): Secondary | ICD-10-CM

## 2021-06-27 DIAGNOSIS — M25512 Pain in left shoulder: Secondary | ICD-10-CM

## 2021-06-27 DIAGNOSIS — G8929 Other chronic pain: Secondary | ICD-10-CM

## 2021-06-27 DIAGNOSIS — M25612 Stiffness of left shoulder, not elsewhere classified: Secondary | ICD-10-CM

## 2021-06-27 DIAGNOSIS — R293 Abnormal posture: Secondary | ICD-10-CM

## 2021-06-27 NOTE — Therapy (Signed)
[facility] [address] [place], [place], [address] Phone: [phone]   Fax:  [phone]  Physical Therapy Treatment  Patient Details  Name: [name] MRN: [id number] Date of Birth: [birth year] Referring Provider (PT): Dr Thomasena Edis   Encounter Date: 06/27/2021   PT End of Session - 06/27/21 1013     Visit Number 11    Date for PT Re-Evaluation 08/12/21    Authorization Type Medicare    PT Start Time 0930    PT Stop Time 1020    PT Time Calculation (min) 50 min    Activity Tolerance Patient tolerated treatment well    Behavior During Therapy [facility] for tasks assessed/performed             Past Medical History:  Diagnosis Date   Asthma    Back pain    Diabetes mellitus    diet controlled    Diverticular disease    Environmental allergies    History of diverticulitis of colon 05/17/2018   w/ perforation  s/p  sigmoid colectomy   History of kidney stones    Hyperlipemia    Hypertension     Past Surgical History:  Procedure Laterality Date   ACHILLES TENDON REPAIR Left 1974   ruptured   bilateral cataract surgery      COLON RESECTION N/A 05/17/2018   Procedure: EXPLORATORY LAPAROTOMY SIGMOID COLECTOMY AND  COLOSTOMY;  Surgeon: Darnell Level, MD;  [facility];  Service: General;  Laterality: N/A;   COLONOSCOPY     COLOSTOMY     COLOSTOMY TAKEDOWN N/A 10/25/2018   Procedure: LAPAROSCOPIC COLOSTOMY REVERSAL;  Surgeon: [name], MD;  [facility];  Service: General;  Laterality: N/A;   INCISIONAL HERNIA REPAIR N/A 04/14/2019   Procedure: OPEN  REPAIR INCISIONAL HERNIA WITH MESH Patch;  Surgeon: Darnell Level, MD;  [facility];  Service: General;  Laterality: N/A;   KIDNEY STONE SURGERY     KNEE ARTHROSCOPY Right    SHOULDER ARTHROSCOPY W/ ROTATOR CUFF REPAIR Right 12-24-2010    dr sypher @[facility]    SHOULDER ARTHROSCOPY W/ SUBACROMIAL DECOMPRESSION AND DISTAL CLAVICLE EXCISION Left 03-28-2012   dr  sypher  @[facility]    debridement   TRIGGER FINGER RELEASE  11/17/2011   Procedure: RELEASE TRIGGER FINGER/A-1 PULLEY;  Surgeon: ., MD;  Location: [facility];  Service: Orthopedics;  Laterality: Left;  RELEASE LEFT LONG TRIGGER FINGER   TUMOR REMOVAL     rt neck/chin    There were no vitals filed for this visit.   Subjective Assessment - 06/27/21 0934     Subjective Pt is doing well. He has been taking his pain medication.    Currently in Pain? Yes    Pain Score 3     Pain Location Shoulder    Pain Orientation Right                               [facility] Adult PT Treatment/Exercise - 06/27/21 0001       Shoulder Exercises: Supine   Flexion AAROM;Right;20 reps   1# WaTe bar     Shoulder Exercises: Standing   External Rotation Right;Theraband;Strengthening   2x12   Theraband Level (Shoulder External Rotation) Level 1 (Yellow)    Internal Rotation Strengthening;Right;Theraband   2x12   Theraband Level (Shoulder Internal Rotation) Level 1 (Yellow)    Other Standing Exercises wall push up 2x10  Shoulder Exercises: Therapy Ball   Flexion Right;10 reps    Flexion Limitations small ball up wall    Other Therapy Ball Exercises ABCs with small ball on wall      Shoulder Exercises: ROM/Strengthening   UBE (Upper Arm Bike) L1.5 x 3 min each way    Other ROM/Strengthening Exercises Cone stacking to bottom shelf of cabinet and back to counter x10, 1# 2x10      Vasopneumatic   Number Minutes Vasopneumatic  10 minutes    Vasopnuematic Location  Shoulder    Vasopneumatic Pressure Medium    Vasopneumatic Temperature  34      Manual Therapy   Manual Therapy Joint mobilization;Passive ROM    Manual therapy comments in supine    Joint Mobilization R GH grade 2    Passive ROM in all glenohumeral planes of motion, per protocol                      PT Short Term Goals - 06/20/21 1044       PT SHORT TERM GOAL #1   Title Pt  will be independent with initial HEP.    Status Achieved               PT Long Term Goals - 06/27/21 1012       PT LONG TERM GOAL #1   Title Pt will be independent with advanced HEP.    Baseline 5/10 pain    Time 12    Period Weeks    Status On-going      PT LONG TERM GOAL #2   Title Pt will increase R shoulder ROM to [facility] to allow him to reach overhead and to wash his back.    Baseline 4-/5 ER, 4/5 IR    Time 12    Period Weeks    Status On-going      PT LONG TERM GOAL #3   Title Pt will increase R shoulder strength to at least 4+/5 to allow him to return to functional activites.    Baseline significant fwd head posture and anteriorly rolled shoulders.    Time 12    Period Weeks    Status On-going      PT LONG TERM GOAL #4   Title Patient will be able to return to dancing with pain no greater than 2/10.    Time 12    Period Weeks    Status On-going                   Plan - 06/27/21 1009     Clinical Impression Statement Pt tolerated exercises well. He continus to progress his strength and ROM. Pt does compensate during AROM activities and continues to have pain at end ranges of motion. Pt did take medication today and his pain was much less. Placing cones onto the bottom shelf was much easier this treatment. He continues to have relief of pain with vaso following treatment.    PT Treatment/Interventions ADLs/Self Care Home Management;Cryotherapy;Electrical Stimulation;Iontophoresis 4mg /ml Dexamethasone;Moist Heat;Ultrasound;Functional mobility training;Therapeutic activities;Therapeutic exercise;Neuromuscular re-education;Patient/family education;Manual techniques;Scar mobilization;Passive range of motion;Dry needling;Joint Manipulations;Taping;Vasopneumatic Device    PT Next Visit Plan Progress per protocol    PT Home Exercise Plan Access Code: [email or web address]             Patient will benefit from skilled therapeutic intervention in order to improve the  following deficits and impairments:  Decreased range of motion, Increased muscle spasms, Increased edema, Decreased strength,  Impaired UE functional use, Pain, Postural dysfunction  Visit Diagnosis: Acute pain of right shoulder  Chronic left shoulder pain  Muscle weakness (generalized)  Abnormal posture  Stiffness of left shoulder, not elsewhere classified  Pain in left arm  Localized edema     Problem List Patient Active Problem List   Diagnosis Date Noted   Ventral incisional hernia 04/14/2019   Incisional hernia, without obstruction or gangrene 04/10/2019   Pneumoperitoneum 05/17/2018   Diverticulitis of colon with perforation s/p colectomy/ostomy 05/17/2018 05/17/2018   S/P exploratory laparotomy 05/17/2018   Diverticular disease    Hypertension    Hyperlipemia     Dorthula Perfect, SPTA 06/27/2021, 10:14 AM  [facility] [address] [place], [place], [address] Phone: [phone]   Fax:  [phone]  Name: Governor Matos MRN: [id number] Date of Birth: May 04, 1951

## 2021-07-01 ENCOUNTER — Ambulatory Visit: Payer: Medicare Other | Admitting: Rehabilitative and Restorative Service Providers"

## 2021-07-01 ENCOUNTER — Other Ambulatory Visit: Payer: Self-pay

## 2021-07-01 ENCOUNTER — Encounter: Payer: Self-pay | Admitting: Rehabilitative and Restorative Service Providers"

## 2021-07-01 DIAGNOSIS — M25612 Stiffness of left shoulder, not elsewhere classified: Secondary | ICD-10-CM

## 2021-07-01 DIAGNOSIS — R293 Abnormal posture: Secondary | ICD-10-CM

## 2021-07-01 DIAGNOSIS — M25511 Pain in right shoulder: Secondary | ICD-10-CM

## 2021-07-01 DIAGNOSIS — R600 Localized edema: Secondary | ICD-10-CM

## 2021-07-01 DIAGNOSIS — M6281 Muscle weakness (generalized): Secondary | ICD-10-CM

## 2021-07-01 DIAGNOSIS — G8929 Other chronic pain: Secondary | ICD-10-CM

## 2021-07-01 DIAGNOSIS — M25512 Pain in left shoulder: Secondary | ICD-10-CM

## 2021-07-01 DIAGNOSIS — M25611 Stiffness of right shoulder, not elsewhere classified: Secondary | ICD-10-CM

## 2021-07-01 NOTE — Therapy (Signed)
[facility] [place] [address] [place], [place], [address] Phone: [phone]   Fax:  [phone]  Physical Therapy Treatment  Patient Details  Name: [name] MRN: [id number] Date of Birth: [birth year] Referring Provider (PT): Dr Thomasena Edis   Encounter Date: 07/01/2021   PT End of Session - 07/01/21 0851     Visit Number 12    Date for PT Re-Evaluation 08/12/21    Authorization Type Medicare    PT Start Time 0845    PT Stop Time 0935    PT Time Calculation (min) 50 min    Activity Tolerance Patient tolerated treatment well    Behavior During Therapy [facility] for tasks assessed/performed             Past Medical History:  Diagnosis Date   Asthma    Back pain    Diabetes mellitus    diet controlled    Diverticular disease    Environmental allergies    History of diverticulitis of colon 05/17/2018   w/ perforation  s/p  sigmoid colectomy   History of kidney stones    Hyperlipemia    Hypertension     Past Surgical History:  Procedure Laterality Date   ACHILLES TENDON REPAIR Left 1974   ruptured   bilateral cataract surgery      COLON RESECTION N/A 05/17/2018   Procedure: EXPLORATORY LAPAROTOMY SIGMOID COLECTOMY AND  COLOSTOMY;  Surgeon: Darnell Level, MD;  Location: [facility];  Service: General;  Laterality: N/A;   COLONOSCOPY     COLOSTOMY     COLOSTOMY TAKEDOWN N/A 10/25/2018   Procedure: LAPAROSCOPIC COLOSTOMY REVERSAL;  Surgeon: [name], MD;  Location[facility];  Service: General;  Laterality: N/A;   INCISIONAL HERNIA REPAIR N/A 04/14/2019   Procedure: OPEN  REPAIR INCISIONAL HERNIA WITH MESH Patch;  Surgeon: Darnell Level, MD;  Location: [facility];  Service: General;  Laterality: N/A;   KIDNEY STONE SURGERY     KNEE ARTHROSCOPY Right    SHOULDER ARTHROSCOPY W/ ROTATOR CUFF REPAIR Right 12-24-2010    dr sypher @[facility]    SHOULDER ARTHROSCOPY W/ SUBACROMIAL DECOMPRESSION AND DISTAL CLAVICLE EXCISION Left 03-28-2012   dr  sypher  @[facility]    debridement   TRIGGER FINGER RELEASE  11/17/2011   Procedure: RELEASE TRIGGER FINGER/A-1 PULLEY;  Surgeon: ., MD;  Location: [facility];  Service: Orthopedics;  Laterality: Left;  RELEASE LEFT LONG TRIGGER FINGER   TUMOR REMOVAL     rt neck/chin    There were no vitals filed for this visit.   Subjective Assessment - 07/01/21 0850     Subjective I have been taking OTC pain medication and it is helping.    Patient Stated Goals To be able to use my arm and do activities that I want to do, like dancing.    Currently in Pain? Yes    Pain Score 3     Pain Location Shoulder    Pain Orientation Right                [facility] PT Assessment - 07/01/21 0001       AROM   Right Shoulder Flexion 110 Degrees    Right Shoulder ABduction 75 Degrees      PROM   Right Shoulder Flexion 150 Degrees    Right Shoulder ABduction 130 Degrees  [facility] Adult PT Treatment/Exercise - 07/01/21 0001       Shoulder Exercises: Supine   Flexion AAROM;Right;20 reps   2# WaTE bar     Shoulder Exercises: Standing   External Rotation Right;Theraband;Strengthening;12 reps   2x12   Theraband Level (Shoulder External Rotation) Level 1 (Yellow)    Internal Rotation Strengthening;Right;Theraband;12 reps   2x12   Theraband Level (Shoulder Internal Rotation) Level 1 (Yellow)    Flexion Strengthening;Right;12 reps   2x12   Theraband Level (Shoulder Flexion) Level 1 (Yellow)    Row Strengthening;Right;12 reps   2x12   Theraband Level (Shoulder Row) Level 1 (Yellow)    Other Standing Exercises Finger ladder for flex and abd x10 each    Other Standing Exercises --      Shoulder Exercises: Therapy Ball   Flexion Right;10 reps    Flexion Limitations small ball up wall    Other Therapy Ball Exercises ABCs with small ball on wall      Shoulder Exercises: ROM/Strengthening   UBE (Upper Arm Bike) L1.5 x 3 min each way     Wall Pushups 20 reps    Wall Pushups Limitations 2x10    Other ROM/Strengthening Exercises Cone stacking to 2nd shelf of cabinet and back to counter x10, 1# 2x10      Shoulder Exercises: Body Blade   Flexion 60 seconds;1 rep    Other Body Blade Exercises Body blade with R/L motion 1 min      Modalities   Modalities Vasopneumatic      Vasopneumatic   Number Minutes Vasopneumatic  10 minutes    Vasopnuematic Location  Shoulder    Vasopneumatic Pressure Medium    Vasopneumatic Temperature  34      Manual Therapy   Manual Therapy Joint mobilization;Passive ROM    Manual therapy comments in supine    Joint Mobilization R GH grade 2    Passive ROM in all glenohumeral planes of motion, per protocol                      PT Short Term Goals - 06/20/21 1044       PT SHORT TERM GOAL #1   Title Pt will be independent with initial HEP.    Status Achieved               PT Long Term Goals - 07/01/21 0947       PT LONG TERM GOAL #1   Title Pt will be independent with advanced HEP.    Status On-going      PT LONG TERM GOAL #2   Title Pt will increase R shoulder ROM to [facility] to allow him to reach overhead and to wash his back.    Status On-going      PT LONG TERM GOAL #3   Title Pt will increase R shoulder strength to at least 4+/5 to allow him to return to functional activites.    Status On-going      PT LONG TERM GOAL #4   Title Patient will be able to return to dancing with pain no greater than 2/10.    Status On-going                   Plan - 07/01/21 0945     Clinical Impression Statement [name] tolerated session well and continues to make gains towards strength and ROM.  He was able to progress to AROM cone stacking to a taller shelf  today, initially with compensations, but as his shoulder loosened up, he was able to perform without as many compensations.  He is having decreased pain since taking OTC pain medication and able to tolerate therapy  sessions better.  He continues to be compliant with HEP and is progressing well towards PT goals.    PT Treatment/Interventions ADLs/Self Care Home Management;Cryotherapy;Electrical Stimulation;Iontophoresis 4mg /ml Dexamethasone;Moist Heat;Ultrasound;Functional mobility training;Therapeutic activities;Therapeutic exercise;Neuromuscular re-education;Patient/family education;Manual techniques;Scar mobilization;Passive range of motion;Dry needling;Joint Manipulations;Taping;Vasopneumatic Device    PT Next Visit Plan Progress per protocol             Patient will benefit from skilled therapeutic intervention in order to improve the following deficits and impairments:  Decreased range of motion, Increased muscle spasms, Increased edema, Decreased strength, Impaired UE functional use, Pain, Postural dysfunction  Visit Diagnosis: Acute pain of right shoulder  Stiffness of right shoulder, not elsewhere classified  Muscle weakness (generalized)  Chronic left shoulder pain  Abnormal posture  Stiffness of left shoulder, not elsewhere classified  Localized edema     Problem List Patient Active Problem List   Diagnosis Date Noted   Ventral incisional hernia 04/14/2019   Incisional hernia, without obstruction or gangrene 04/10/2019   Pneumoperitoneum 05/17/2018   Diverticulitis of colon with perforation s/p colectomy/ostomy 05/17/2018 05/17/2018   S/P exploratory laparotomy 05/17/2018   Diverticular disease    Hypertension    Hyperlipemia     05/19/2018, PT, DPT 07/01/2021, 9:50 AM  [facility] [place] [facility] [address] [place], [place], [place] Phone: [phone]   Fax:  [phone]  Name: [name] MRN: Elenore Rota Date of Birth: 23-Dec-1950

## 2021-07-08 ENCOUNTER — Ambulatory Visit: Payer: Medicare Other | Attending: Physician Assistant | Admitting: Physical Therapy

## 2021-07-08 ENCOUNTER — Other Ambulatory Visit: Payer: Self-pay

## 2021-07-08 DIAGNOSIS — M79602 Pain in left arm: Secondary | ICD-10-CM | POA: Insufficient documentation

## 2021-07-08 DIAGNOSIS — M6281 Muscle weakness (generalized): Secondary | ICD-10-CM | POA: Diagnosis present

## 2021-07-08 DIAGNOSIS — R600 Localized edema: Secondary | ICD-10-CM | POA: Diagnosis present

## 2021-07-08 DIAGNOSIS — M25511 Pain in right shoulder: Secondary | ICD-10-CM | POA: Insufficient documentation

## 2021-07-08 DIAGNOSIS — R293 Abnormal posture: Secondary | ICD-10-CM | POA: Diagnosis present

## 2021-07-08 DIAGNOSIS — M25612 Stiffness of left shoulder, not elsewhere classified: Secondary | ICD-10-CM | POA: Insufficient documentation

## 2021-07-08 DIAGNOSIS — M25611 Stiffness of right shoulder, not elsewhere classified: Secondary | ICD-10-CM | POA: Insufficient documentation

## 2021-07-08 DIAGNOSIS — G8929 Other chronic pain: Secondary | ICD-10-CM | POA: Insufficient documentation

## 2021-07-08 DIAGNOSIS — M25512 Pain in left shoulder: Secondary | ICD-10-CM | POA: Insufficient documentation

## 2021-07-08 NOTE — Therapy (Signed)
[facility] [address] [place], [place], [address] Phone: [phone]   Fax:  [phone]  Physical Therapy Treatment  Patient Details  Name: [name] MRN: [id number] Date of Birth: [birth year] Referring Provider (PT): Dr Thomasena Edis   Encounter Date: 07/08/2021   PT End of Session - 07/08/21 0840     Visit Number 13    Date for PT Re-Evaluation 08/12/21    PT Start Time 0756    PT Stop Time 0850    PT Time Calculation (min) 54 min             Past Medical History:  Diagnosis Date   Asthma    Back pain    Diabetes mellitus    diet controlled    Diverticular disease    Environmental allergies    History of diverticulitis of colon 05/17/2018   w/ perforation  s/p  sigmoid colectomy   History of kidney stones    Hyperlipemia    Hypertension     Past Surgical History:  Procedure Laterality Date   ACHILLES TENDON REPAIR Left 1974   ruptured   bilateral cataract surgery      COLON RESECTION N/A 05/17/2018   Procedure: EXPLORATORY LAPAROTOMY SIGMOID COLECTOMY AND  COLOSTOMY;  Surgeon: Darnell Level, MD;  Location: [facility];  Service: General;  Laterality: N/A;   COLONOSCOPY     COLOSTOMY     COLOSTOMY TAKEDOWN N/A 10/25/2018   Procedure: LAPAROSCOPIC COLOSTOMY REVERSAL;  Surgeon: [name], MD;  Location: [facility];  Service: General;  Laterality: N/A;   INCISIONAL HERNIA REPAIR N/A 04/14/2019   Procedure: OPEN  REPAIR INCISIONAL HERNIA WITH MESH Patch;  Surgeon: Darnell Level, MD;  Location: [facility];  Service: General;  Laterality: N/A;   KIDNEY STONE SURGERY     KNEE ARTHROSCOPY Right    SHOULDER ARTHROSCOPY W/ ROTATOR CUFF REPAIR Right 12-24-2010    dr sypher @[facility]    SHOULDER ARTHROSCOPY W/ SUBACROMIAL DECOMPRESSION AND DISTAL CLAVICLE EXCISION Left 03-28-2012   dr sypher  @[facility]    debridement   TRIGGER FINGER RELEASE  11/17/2011   Procedure: RELEASE TRIGGER FINGER/A-1 PULLEY;  Surgeon: ., MD;   Location: [facility];  Service: Orthopedics;  Laterality: Left;  RELEASE LEFT LONG TRIGGER FINGER   TUMOR REMOVAL     rt neck/chin    There were no vitals filed for this visit.   Subjective Assessment - 07/08/21 0758     Subjective getting there, pain minimal until I start using it    Currently in Pain? Yes    Pain Score 3     Pain Location Shoulder    Pain Orientation Right                [facility] PT Assessment - 07/08/21 0001       AROM   Overall AROM Comments AROM tested after UBE warm up at begining of session    AROM Assessment Site Shoulder    Right/Left Shoulder Right   STANDING   Right Shoulder Flexion 110 Degrees    Right Shoulder ABduction 50 Degrees      PROM   Right Shoulder Flexion 150 Degrees    Right Shoulder ABduction 115 Degrees                           [facility] Adult PT Treatment/Exercise - 07/08/21 0001       Shoulder Exercises: Standing  Other Standing Exercises Finger ladder for flex and abd x8 each with eccentric lowering    Other Standing Exercises 3# cane ex 10 each   1# func reaching     Shoulder Exercises: ROM/Strengthening   UBE (Upper Arm Bike) L 2 3 min fwd/3 min backward    Lat Pull 20 reps   20#   Cybex Press 20 reps   10#   Cybex Row 20 reps   20#     Vasopneumatic   Number Minutes Vasopneumatic  10 minutes    Vasopnuematic Location  Shoulder    Vasopneumatic Pressure Medium    Vasopneumatic Temperature  34      Manual Therapy   Manual Therapy Joint mobilization;Passive ROM    Manual therapy comments insupine    Joint Mobilization R GH grade 2    Passive ROM in all glenohumeral planes of motion, per protocol                      PT Short Term Goals - 06/20/21 1044       PT SHORT TERM GOAL #1   Title Pt will be independent with initial HEP.    Status Achieved               PT Long Term Goals - 07/08/21 0839       PT LONG TERM GOAL #1   Title Pt will be independent  with advanced HEP.    Status On-going      PT LONG TERM GOAL #2   Title Pt will increase R shoulder ROM to [facility] to allow him to reach overhead and to wash his back.    Status On-going      PT LONG TERM GOAL #3   Title Pt will increase R shoulder strength to at least 4+/5 to allow him to return to functional activites.    Status On-going      PT LONG TERM GOAL #4   Title Patient will be able to return to dancing with pain no greater than 2/10.    Status On-going                   Plan - 07/08/21 0840     Clinical Impression Statement slight decrease in AROM but tested at beginning of session. pt 9 weeks out so starting light PREs. decreased eccentric control and pain. pt tolerated supine PROM well . progressing with goals    PT Treatment/Interventions Canalith Repostioning    PT Next Visit Plan 9 weeks s/p RTC repair             Patient will benefit from skilled therapeutic intervention in order to improve the following deficits and impairments:  Decreased range of motion, Increased muscle spasms, Increased edema, Decreased strength, Impaired UE functional use, Pain, Postural dysfunction  Visit Diagnosis: Acute pain of right shoulder  Stiffness of right shoulder, not elsewhere classified  Muscle weakness (generalized)  Localized edema     Problem List Patient Active Problem List   Diagnosis Date Noted   Ventral incisional hernia 04/14/2019   Incisional hernia, without obstruction or gangrene 04/10/2019   Pneumoperitoneum 05/17/2018   Diverticulitis of colon with perforation s/p colectomy/ostomy 05/17/2018 05/17/2018   S/P exploratory laparotomy 05/17/2018   Diverticular disease    Hypertension    Hyperlipemia     [name],ANGIE PTA 07/08/2021, 8:45 AM  [facility] [address] [place], [place], [address] Phone:  [phone]   Fax:  [phone]  Name: [name] MRN: [id number] Date of Birth:  [birth year]

## 2021-07-12 ENCOUNTER — Other Ambulatory Visit: Payer: Self-pay

## 2021-07-12 ENCOUNTER — Ambulatory Visit: Payer: Medicare Other | Admitting: Physical Therapy

## 2021-07-12 DIAGNOSIS — M25511 Pain in right shoulder: Secondary | ICD-10-CM | POA: Diagnosis not present

## 2021-07-12 DIAGNOSIS — R600 Localized edema: Secondary | ICD-10-CM

## 2021-07-12 DIAGNOSIS — M6281 Muscle weakness (generalized): Secondary | ICD-10-CM

## 2021-07-12 DIAGNOSIS — M25611 Stiffness of right shoulder, not elsewhere classified: Secondary | ICD-10-CM

## 2021-07-12 NOTE — Therapy (Signed)
[facility] [address] [place], [place], [address] Phone: [phone]   Fax:  [phone]  Physical Therapy Treatment  Patient Details  Name: [name] MRN: [id number] Date of Birth: [birth year] Referring Provider (PT): Dr Thomasena Edis   Encounter Date: 07/12/2021   PT End of Session - 07/12/21 1008     Visit Number 14    Date for PT Re-Evaluation 08/12/21    PT Start Time 0930    PT Stop Time 1020    PT Time Calculation (min) 50 min             Past Medical History:  Diagnosis Date   Asthma    Back pain    Diabetes mellitus    diet controlled    Diverticular disease    Environmental allergies    History of diverticulitis of colon 05/17/2018   w/ perforation  s/p  sigmoid colectomy   History of kidney stones    Hyperlipemia    Hypertension     Past Surgical History:  Procedure Laterality Date   ACHILLES TENDON REPAIR Left 1974   ruptured   bilateral cataract surgery      COLON RESECTION N/A 05/17/2018   Procedure: EXPLORATORY LAPAROTOMY SIGMOID COLECTOMY AND  COLOSTOMY;  Surgeon: Darnell Level, MD;  [facility];  Service: General;  Laterality: N/A;   COLONOSCOPY     COLOSTOMY     COLOSTOMY TAKEDOWN N/A 10/25/2018   Procedure: LAPAROSCOPIC COLOSTOMY REVERSAL;  Surgeon: [name], MD;  [facility];  Service: General;  Laterality: N/A;   INCISIONAL HERNIA REPAIR N/A 04/14/2019   Procedure: OPEN  REPAIR INCISIONAL HERNIA WITH MESH Patch;  Surgeon: Darnell Level, MD;  [facility];  Service: General;  Laterality: N/A;   KIDNEY STONE SURGERY     KNEE ARTHROSCOPY Right    SHOULDER ARTHROSCOPY W/ ROTATOR CUFF REPAIR Right 12-24-2010    dr sypher @[facility]    SHOULDER ARTHROSCOPY W/ SUBACROMIAL DECOMPRESSION AND DISTAL CLAVICLE EXCISION Left 03-28-2012   dr sypher  @[facility]    debridement   TRIGGER FINGER RELEASE  11/17/2011   Procedure: RELEASE TRIGGER FINGER/A-1 PULLEY;  Surgeon: ., MD;   Location: [facility];  Service: Orthopedics;  Laterality: Left;  RELEASE LEFT LONG TRIGGER FINGER   TUMOR REMOVAL     rt neck/chin    There were no vitals filed for this visit.   Subjective Assessment - 07/12/21 0937     Subjective doing Wyn Forster    Currently in Pain? Yes    Pain Score 3     Pain Location Shoulder                               [facility] Adult PT Treatment/Exercise - 07/12/21 0001       Shoulder Exercises: Supine   Flexion 10 reps   1#     Shoulder Exercises: Standing   External Rotation Strengthening;Right;20 reps;Theraband    Theraband Level (Shoulder External Rotation) Level 2 (Red)    Internal Rotation Strengthening;Right;Theraband;20 reps    Theraband Level (Shoulder Internal Rotation) Level 2 (Red)    Other Standing Exercises Finger ladder for flex and abd x8 each with eccentric lowering   1 cabinet reaching flex ( 2 shelves) and abd ( 1 shelf)   Other Standing Exercises 3# cane ex 10 each   wt ball BIL UE OH and chest press 10 each  Shoulder Exercises: ROM/Strengthening   UBE (Upper Arm Bike) L 2 3 min fwd/3 min backward    Lat Pull 20 reps   25#   Cybex Row 20 reps   25# with postural cuing     Vasopneumatic   Number Minutes Vasopneumatic  10 minutes    Vasopnuematic Location  Shoulder    Vasopneumatic Pressure Medium    Vasopneumatic Temperature  34      Manual Therapy   Manual Therapy Joint mobilization;Passive ROM    Manual therapy comments insupine, tenderness over bicep area    Joint Mobilization R GH grade 2    Passive ROM in all glenohumeral planes of motion, per protocol                      PT Short Term Goals - 06/20/21 1044       PT SHORT TERM GOAL #1   Title Pt will be independent with initial HEP.    Status Achieved               PT Long Term Goals - 07/08/21 0839       PT LONG TERM GOAL #1   Title Pt will be independent with advanced HEP.    Status On-going      PT  LONG TERM GOAL #2   Title Pt will increase R shoulder ROM to [facility] to allow him to reach overhead and to wash his back.    Status On-going      PT LONG TERM GOAL #3   Title Pt will increase R shoulder strength to at least 4+/5 to allow him to return to functional activites.    Status On-going      PT LONG TERM GOAL #4   Title Patient will be able to return to dancing with pain no greater than 2/10.    Status On-going                   Plan - 07/12/21 1008     Clinical Impression Statement pt tolerated ther ex well but does fatigue quickly and needs assistance to increase ROM. improved ecc control. painful over bicep are.    PT Next Visit Plan 10 week RTC repair. write MD note             Patient will benefit from skilled therapeutic intervention in order to improve the following deficits and impairments:  Decreased range of motion, Increased muscle spasms, Increased edema, Decreased strength, Impaired UE functional use, Pain, Postural dysfunction  Visit Diagnosis: Acute pain of right shoulder  Stiffness of right shoulder, not elsewhere classified  Muscle weakness (generalized)  Localized edema     Problem List Patient Active Problem List   Diagnosis Date Noted   Ventral incisional hernia 04/14/2019   Incisional hernia, without obstruction or gangrene 04/10/2019   Pneumoperitoneum 05/17/2018   Diverticulitis of colon with perforation s/p colectomy/ostomy 05/17/2018 05/17/2018   S/P exploratory laparotomy 05/17/2018   Diverticular disease    Hypertension    Hyperlipemia     Florice Hindle,ANGIE PTA 07/12/2021, 10:10 AM  [facility] [address] [place], [place], [address] Phone: [phone]   Fax:  [phone]  Name: [name] MRN: [id number] Date of Birth: Jan 31, 1951

## 2021-07-15 ENCOUNTER — Ambulatory Visit: Payer: Medicare Other | Admitting: Rehabilitative and Restorative Service Providers"

## 2021-07-15 ENCOUNTER — Encounter: Payer: Self-pay | Admitting: Rehabilitative and Restorative Service Providers"

## 2021-07-15 ENCOUNTER — Other Ambulatory Visit: Payer: Self-pay

## 2021-07-15 DIAGNOSIS — M79602 Pain in left arm: Secondary | ICD-10-CM

## 2021-07-15 DIAGNOSIS — G8929 Other chronic pain: Secondary | ICD-10-CM

## 2021-07-15 DIAGNOSIS — M25511 Pain in right shoulder: Secondary | ICD-10-CM | POA: Diagnosis not present

## 2021-07-15 DIAGNOSIS — R600 Localized edema: Secondary | ICD-10-CM

## 2021-07-15 DIAGNOSIS — R293 Abnormal posture: Secondary | ICD-10-CM

## 2021-07-15 DIAGNOSIS — M6281 Muscle weakness (generalized): Secondary | ICD-10-CM

## 2021-07-15 DIAGNOSIS — M25612 Stiffness of left shoulder, not elsewhere classified: Secondary | ICD-10-CM

## 2021-07-15 DIAGNOSIS — M25611 Stiffness of right shoulder, not elsewhere classified: Secondary | ICD-10-CM

## 2021-07-15 NOTE — Therapy (Signed)
[facility]. [place], [place], [address] Phone: [phone]   Fax:  [phone]  Physical Therapy Treatment  Patient Details  Name: [name] MRN: [id number] Date of Birth: 30-Dec-1950 Referring Provider (PT): Dr Theda Sers   Encounter Date: 07/15/2021   PT End of Session - 07/15/21 0817     Visit Number 15    Date for PT Re-Evaluation 08/12/21    Authorization Type Medicare    PT Start Time 0800    PT Stop Time 0840    PT Time Calculation (min) 40 min    Activity Tolerance Patient tolerated treatment well    Behavior During Therapy [name] for tasks assessed/performed             Past Medical History:  Diagnosis Date   Asthma    Back pain    Diabetes mellitus    diet controlled    Diverticular disease    Environmental allergies    History of diverticulitis of colon 05/17/2018   w/ perforation  s/p  sigmoid colectomy   History of kidney stones    Hyperlipemia    Hypertension     Past Surgical History:  Procedure Laterality Date   ACHILLES TENDON REPAIR Left 1974   ruptured   bilateral cataract surgery      COLON RESECTION N/A 05/17/2018   Procedure: EXPLORATORY LAPAROTOMY SIGMOID COLECTOMY AND  COLOSTOMY;  Surgeon: Armandina Gemma, MD;  Location[facility];  Service: General;  Laterality: N/A;   COLONOSCOPY     COLOSTOMY     COLOSTOMY TAKEDOWN N/A 10/25/2018   Procedure: LAPAROSCOPIC COLOSTOMY REVERSAL;  Surgeon: Leighton Ruff, MD;  Location[facility];  Service: General;  Laterality: N/A;   [place] N/A 04/14/2019   Procedure: OPEN  REPAIR INCISIONAL HERNIA WITH MESH Patch;  Surgeon: Armandina Gemma, MD;  Location[facility];  Service: General;  Laterality: N/A;   KIDNEY STONE SURGERY     KNEE ARTHROSCOPY Right    SHOULDER ARTHROSCOPY W/ ROTATOR CUFF REPAIR Right 12-24-2010    dr sypher @[facility]    SHOULDER ARTHROSCOPY W/ SUBACROMIAL DECOMPRESSION AND DISTAL CLAVICLE EXCISION Left 03-28-2012   dr  sypher  @[facility]    debridement   TRIGGER FINGER RELEASE  11/17/2011   Procedure: RELEASE TRIGGER FINGER/A-1 PULLEY;  Surgeon: Cammie Sickle., MD;  Location: [place];  Service: Orthopedics;  Laterality: Left;  RELEASE LEFT LONG TRIGGER FINGER   TUMOR REMOVAL     rt neck/chin    There were no vitals filed for this visit.   Subjective Assessment - 07/15/21 0815     Subjective I go to the surgeon on Monday.  I am doing okay.    Patient Stated Goals To be able to use my arm and do activities that I want to do, like dancing.    Currently in Pain? Yes    Pain Score 3     Pain Location Shoulder    Pain Orientation Right    Pain Descriptors / Indicators Penetrating    Pain Type Surgical pain                [facility] PT Assessment - 07/15/21 0001       Assessment   Medical Diagnosis s/p R rotator cuff repair on 04/28/21    Referring Provider (PT) Dr Theda Sers    Onset Date/Surgical Date 04/28/21      AROM   AROM Assessment Site Shoulder    Right/Left Shoulder  Right   all measured in supine   Right Shoulder Flexion 132 Degrees   in supine   Right Shoulder ABduction 105 Degrees   in supine   Right Shoulder Internal Rotation 42 Degrees   measured at approx 30 deg abd   Right Shoulder External Rotation 32 Degrees   measured at approx 30 deg abd.     PROM   Right/Left Shoulder Right   All measured in supine   Right Shoulder Flexion 150 Degrees    Right Shoulder ABduction 122 Degrees                           [facility] Adult PT Treatment/Exercise - 07/15/21 0001       Shoulder Exercises: Supine   Flexion 10 reps    Shoulder Flexion Weight (lbs) 2      Shoulder Exercises: Standing   External Rotation Strengthening;Right;20 reps;Theraband    Theraband Level (Shoulder External Rotation) Level 2 (Red)    Internal Rotation Strengthening;Right;Theraband;20 reps    Theraband Level (Shoulder Internal Rotation) Level 2 (Red)    Other Standing Exercises  Finger ladder for flex and abd x8 each with eccentric lowering    Other Standing Exercises 3# cane ex 10 each   yellow wt ball BIL UE OH and chest press 10 each     Shoulder Exercises: ROM/Strengthening   UBE (Upper Arm Bike) L 2 3 min fwd/3 min backward    Lat Pull 20 reps    Lat Pull Limitations 25#    Cybex Row 20 reps    Cybex Row Limitations 25#    Wall Pushups 20 reps    Other ROM/Strengthening Exercises Dumbbell stacking 2# to 2nd shelf of cabinet and back to counter 2x10, Abd 2# 2x10 to first shelf.      Manual Therapy   Manual Therapy Joint mobilization;Passive ROM    Manual therapy comments insupine, tenderness over bicep area    Joint Mobilization R GH grade 2    Passive ROM in all glenohumeral planes of motion, per protocol                      PT Short Term Goals - 06/20/21 1044       PT SHORT TERM GOAL #1   Title Pt will be independent with initial HEP.    Status Achieved               PT Long Term Goals - 07/15/21 0846       PT LONG TERM GOAL #1   Title Pt will be independent with advanced HEP.    Status Partially Met      PT LONG TERM GOAL #2   Title Pt will increase R shoulder ROM to [facility] to allow him to reach overhead and to wash his back.    Status Partially Met      PT LONG TERM GOAL #3   Title Pt will increase R shoulder strength to at least 4+/5 to allow him to return to functional activites.    Status On-going      PT LONG TERM GOAL #4   Title Patient will be able to return to dancing with pain no greater than 2/10.    Status Partially Met                   Plan - 07/15/21 0843     Clinical Impression Statement [name] has  made excellent progress with his R rotator cuff protocol and post op care.  He is continuing to increase with ROM and improved ecc muscle control.  He does have a trigger point around his proximal bicep/deltoid region, but it was relieved with manual trigger point release.  He continues to progress  towards goal related activities and is independent and compliant with his HEP.    PT Treatment/Interventions ADLs/Self Care Home Management;Cryotherapy;Electrical Stimulation;Iontophoresis 64m/ml Dexamethasone;Moist Heat;Functional mobility training;Therapeutic activities;Therapeutic exercise;Ultrasound;Neuromuscular re-education;Patient/family education;Manual techniques;Scar mobilization;Passive range of motion;Dry needling;Taping;Joint Manipulations    PT Next Visit Plan ask about appointment with MD follow up    Consulted and Agree with Plan of Care Patient             Patient will benefit from skilled therapeutic intervention in order to improve the following deficits and impairments:  Decreased range of motion, Increased muscle spasms, Increased edema, Decreased strength, Impaired UE functional use, Pain, Postural dysfunction  Visit Diagnosis: Acute pain of right shoulder  Stiffness of right shoulder, not elsewhere classified  Muscle weakness (generalized)  Localized edema  Chronic left shoulder pain  Abnormal posture  Stiffness of left shoulder, not elsewhere classified  Pain in left arm     Problem List Patient Active Problem List   Diagnosis Date Noted   Ventral incisional hernia 04/14/2019   Incisional hernia, without obstruction or gangrene 04/10/2019   Pneumoperitoneum 05/17/2018   Diverticulitis of colon with perforation s/p colectomy/ostomy 05/17/2018 05/17/2018   S/P exploratory laparotomy 05/17/2018   Diverticular disease    Hypertension    Hyperlipemia     SJuel Burrow PT, DPT 07/15/2021, 8:48 AM  [place] [id number]: [phone]  Fax:  [phone] Name: LSamer DuttonMRN: [id number] of Birth: [birth year]

## 2021-07-19 ENCOUNTER — Ambulatory Visit: Payer: Medicare Other | Admitting: Physical Therapy

## 2021-07-22 ENCOUNTER — Ambulatory Visit: Payer: Medicare Other | Admitting: Rehabilitative and Restorative Service Providers"

## 2021-11-05 ENCOUNTER — Encounter (HOSPITAL_BASED_OUTPATIENT_CLINIC_OR_DEPARTMENT_OTHER): Payer: Self-pay

## 2021-11-05 ENCOUNTER — Emergency Department (HOSPITAL_BASED_OUTPATIENT_CLINIC_OR_DEPARTMENT_OTHER)
Admission: EM | Admit: 2021-11-05 | Discharge: 2021-11-05 | Disposition: A | Discharge status: Home / Self Care | Payer: Medicare Other | Attending: Emergency Medicine | Admitting: Emergency Medicine

## 2021-11-05 ENCOUNTER — Other Ambulatory Visit: Payer: Self-pay

## 2021-11-05 DIAGNOSIS — J45909 Unspecified asthma, uncomplicated: Secondary | ICD-10-CM | POA: Insufficient documentation

## 2021-11-05 DIAGNOSIS — Z87891 Personal history of nicotine dependence: Secondary | ICD-10-CM | POA: Insufficient documentation

## 2021-11-05 DIAGNOSIS — Z96612 Presence of left artificial shoulder joint: Secondary | ICD-10-CM | POA: Insufficient documentation

## 2021-11-05 DIAGNOSIS — Z79899 Other long term (current) drug therapy: Secondary | ICD-10-CM | POA: Diagnosis not present

## 2021-11-05 DIAGNOSIS — Z96651 Presence of right artificial knee joint: Secondary | ICD-10-CM | POA: Diagnosis not present

## 2021-11-05 DIAGNOSIS — R1012 Left upper quadrant pain: Secondary | ICD-10-CM | POA: Insufficient documentation

## 2021-11-05 DIAGNOSIS — I10 Essential (primary) hypertension: Secondary | ICD-10-CM | POA: Insufficient documentation

## 2021-11-05 LAB — CBC WITH DIFFERENTIAL/PLATELET
Abs Immature Granulocytes: 0.03 10*3/uL (ref 0.00–0.07)
Basophils Absolute: 0.1 10*3/uL (ref 0.0–0.1)
Basophils Relative: 1 %
Eosinophils Absolute: 0.3 10*3/uL (ref 0.0–0.5)
Eosinophils Relative: 4 %
HCT: 40.6 % (ref 39.0–52.0)
Hemoglobin: 14.1 g/dL (ref 13.0–17.0)
Immature Granulocytes: 0 %
Lymphocytes Relative: 38 %
Lymphs Abs: 2.6 10*3/uL (ref 0.7–4.0)
MCH: 31.5 pg (ref 26.0–34.0)
MCHC: 34.7 g/dL (ref 30.0–36.0)
MCV: 90.8 fL (ref 80.0–100.0)
Monocytes Absolute: 0.8 10*3/uL (ref 0.1–1.0)
Monocytes Relative: 11 %
Neutro Abs: 3.1 10*3/uL (ref 1.7–7.7)
Neutrophils Relative %: 46 %
Platelets: 336 10*3/uL (ref 150–400)
RBC: 4.47 MIL/uL (ref 4.22–5.81)
RDW: 12.7 % (ref 11.5–15.5)
WBC: 6.9 10*3/uL (ref 4.0–10.5)
nRBC: 0 % (ref 0.0–0.2)

## 2021-11-05 LAB — COMPREHENSIVE METABOLIC PANEL
ALT: 42 U/L (ref 0–44)
AST: 36 U/L (ref 15–41)
Albumin: 4.6 g/dL (ref 3.5–5.0)
Alkaline Phosphatase: 47 U/L (ref 38–126)
Anion gap: 8 (ref 5–15)
BUN: 21 mg/dL (ref 8–23)
CO2: 24 mmol/L (ref 22–32)
Calcium: 10.2 mg/dL (ref 8.9–10.3)
Chloride: 106 mmol/L (ref 98–111)
Creatinine, Ser: 0.9 mg/dL (ref 0.61–1.24)
GFR, Estimated: >60 mL/min (ref >60)
Glucose, Bld: 141 mg/dL — ABNORMAL HIGH (ref 70–99)
Potassium: 4.5 mmol/L (ref 3.5–5.1)
Sodium: 138 mmol/L (ref 135–145)
Total Bilirubin: 0.5 mg/dL (ref 0.3–1.2)
Total Protein: 7.1 g/dL (ref 6.5–8.1)

## 2021-11-05 LAB — URINALYSIS, ROUTINE W REFLEX MICROSCOPIC
Bilirubin Urine: NEGATIVE
Glucose, UA: NEGATIVE mg/dL
Hgb urine dipstick: NEGATIVE
Ketones, ur: NEGATIVE mg/dL
Leukocytes,Ua: NEGATIVE
Nitrite: NEGATIVE
Protein, ur: NEGATIVE mg/dL
Specific Gravity, Urine: 1.025 (ref 1.005–1.030)
pH: 6.5 (ref 5.0–8.0)

## 2021-11-05 LAB — LIPASE, BLOOD: Lipase: 87 U/L — ABNORMAL HIGH (ref 11–51)

## 2021-11-05 MED ORDER — PANTOPRAZOLE SODIUM 20 MG PO TBEC: 20.0000 mg | DELAYED_RELEASE_TABLET | Freq: Every day | ORAL | 0 refills | Status: DC

## 2021-11-05 MED ORDER — SUCRALFATE 1 G PO TABS: 1.0000 g | ORAL_TABLET | Freq: Three times a day (TID) | ORAL | 0 refills | Status: DC

## 2021-11-05 MED ORDER — PANTOPRAZOLE SODIUM 40 MG IV SOLR
40.0000 mg | Freq: Once | INTRAVENOUS | Status: AC
  Administered 2021-11-05: 40 mg via INTRAVENOUS
  Filled 2021-11-05: qty 40

## 2021-11-05 MED ORDER — SUCRALFATE 1 GM/10ML PO SUSP
1.0000 g | Freq: Once | ORAL | Status: AC
  Administered 2021-11-05: 1 g via ORAL
  Filled 2021-11-05: qty 10

## 2021-11-05 MED ORDER — SUCRALFATE 1 G PO TABS
1.0000 g | ORAL_TABLET | Freq: Once | ORAL | Status: DC
Start: 2021-11-05 — End: 2021-11-05

## 2021-11-05 NOTE — ED Triage Notes (Signed)
Pt c/o pain to upper left abdomen "under ribs" x 1.5 weeks. Went to UC last week, normal chest xray, given muscle relaxers. Pain continues. Denies other symptoms.

## 2021-11-05 NOTE — ED Notes (Addendum)
Pt discharged to home. Discharge instructions have been discussed with patient and/or family members. Pt verbally acknowledges understanding d/c instructions, and endorses comprehension to checkout at registration before leaving. Reviewed diet recommendations with patient

## 2021-11-05 NOTE — ED Provider Notes (Signed)
[facility] EMERGENCY DEPARTMENT Provider Note   CSN: [id number] Arrival date & time: 11/05/21  1009     History Chief Complaint  Patient presents with   Abdominal Pain    [name] is a 70 y.o. male.  HPI Patient is a 70 year old gentleman with a past medical history significant for DM2, HLD, HTN, history of diverticulitis with perforation status post partial colectomy with ostomy and reversal this was years ago  Patient is presented to ER today with complaints of left upper abdominal pain denies any epigastric or right upper quadrant pain or any other abdominal discomfort.  He states his symptoms have been going on for 1.5 weeks.  He rates the pain at 2/10.  States it is nonradiating.  Not associated nausea vomiting diarrhea bloody stools fevers or chills.  States seems to be worse with bending over but also seems occasionally worse with eating. Denies any black or tarry stool.   Denies any fevers or chills cough congestion chest pain shortness of breath      Past Medical History:  Diagnosis Date   Asthma    Back pain    Diabetes mellitus    diet controlled    Diverticular disease    Environmental allergies    History of diverticulitis of colon 05/17/2018   w/ perforation  s/p  sigmoid colectomy   History of kidney stones    Hyperlipemia    Hypertension     Patient Active Problem List   Diagnosis Date Noted   Ventral incisional hernia 04/14/2019   Incisional hernia, without obstruction or gangrene 04/10/2019   Pneumoperitoneum 05/17/2018   Diverticulitis of colon with perforation s/p colectomy/ostomy 05/17/2018 05/17/2018   S/P exploratory laparotomy 05/17/2018   Diverticular disease    Hypertension    Hyperlipemia     Past Surgical History:  Procedure Laterality Date   ACHILLES TENDON REPAIR Left 1974   ruptured   bilateral cataract surgery      COLON RESECTION N/A 05/17/2018   Procedure: EXPLORATORY LAPAROTOMY SIGMOID COLECTOMY AND  COLOSTOMY;   Surgeon: Darnell Level, MD;  Location: [facility];  Service: General;  Laterality: N/A;   COLONOSCOPY     COLOSTOMY     COLOSTOMY TAKEDOWN N/A 10/25/2018   Procedure: LAPAROSCOPIC COLOSTOMY REVERSAL;  Surgeon: [name], MD;  Location: [facility];  Service: General;  Laterality: N/A;   INCISIONAL HERNIA REPAIR N/A 04/14/2019   Procedure: OPEN  REPAIR INCISIONAL HERNIA WITH MESH Patch;  Surgeon: Darnell Level, MD;  Location: [facility];  Service: General;  Laterality: N/A;   KIDNEY STONE SURGERY     KNEE ARTHROSCOPY Right    SHOULDER ARTHROSCOPY W/ ROTATOR CUFF REPAIR Right 12-24-2010    dr sypher @[facility]    SHOULDER ARTHROSCOPY W/ SUBACROMIAL DECOMPRESSION AND DISTAL CLAVICLE EXCISION Left 03-28-2012   dr sypher  @[facility]    debridement   TRIGGER FINGER RELEASE  11/17/2011   Procedure: RELEASE TRIGGER FINGER/A-1 PULLEY;  Surgeon: ., MD;  Location: [facility];  Service: Orthopedics;  Laterality: Left;  RELEASE LEFT LONG TRIGGER FINGER   TUMOR REMOVAL     rt neck/chin       History reviewed. No pertinent family history.  Social History   Tobacco Use   Smoking status: Former   Smokeless tobacco: Never   Tobacco comments:    never really was a smoker many years ago   Vaping Use   Vaping Use: Never used  Substance Use Topics   Alcohol use:  Never   Drug use: No    Home Medications Prior to Admission medications   Medication Sig Start Date End Date Taking? Authorizing Provider  pantoprazole (PROTONIX) 20 MG tablet Take 1 tablet (20 mg total) by mouth daily. 11/05/21  Yes Domnique Vantine S, PA  sucralfate (CARAFATE) 1 g tablet Take 1 tablet (1 g total) by mouth 4 (four) times daily -  with meals and at bedtime. 11/05/21  Yes Shivan Hodes S, PA  albuterol (PROVENTIL HFA;VENTOLIN HFA) 108 (90 BASE) MCG/ACT inhaler Inhale 2 puffs into the lungs every 6 (six) hours as needed for wheezing.     [provider]  allopurinol (ZYLOPRIM) 100 MG tablet Take 100 mg by  mouth daily.    [provider]  allopurinol (ZYLOPRIM) 300 MG tablet Take 300 mg by mouth daily.    [provider]  Ascorbic Acid (VITAMIN C) 1000 MG tablet Take 1,000 mg by mouth daily.    [provider]  CINNAMON PO Take 1,000 mg by mouth daily.    [provider]  diclofenac Sodium (VOLTAREN) 1 % GEL Apply 2 g topically 4 (four) times daily. 12/30/19   Long, Arlyss Repress, MD  fenofibrate 160 MG tablet Take 160 mg by mouth at bedtime.     [provider]  lisinopril (PRINIVIL,ZESTRIL) 40 MG tablet Take 40 mg by mouth daily.  05/01/18   [provider]  methocarbamol (ROBAXIN) 500 MG tablet Take 1 tablet (500 mg total) by mouth every 8 (eight) hours as needed for muscle spasms. 12/30/19   Long, Arlyss Repress, MD  Multiple Vitamin (MULTIVITAMIN WITH MINERALS) TABS tablet Take 1 tablet by mouth daily.    [provider]  oxyCODONE (OXY IR/ROXICODONE) 5 MG immediate release tablet Take 1-2 tablets (5-10 mg total) by mouth every 4 (four) hours as needed for moderate pain. 04/15/19   Darnell Level, MD  polyvinyl alcohol (ARTIFICIAL TEARS) 1.4 % ophthalmic solution Place 1 drop into both eyes 2 (two) times a day.     [provider]  psyllium (METAMUCIL) 58.6 % packet Take 1 packet by mouth at bedtime.     [provider]  rosuvastatin (CRESTOR) 40 MG tablet Take 20 mg by mouth at bedtime.     [provider]    Allergies    Hibiclens [chlorhexidine gluconate], Ciprofloxacin, and Flagyl [metronidazole]  Review of Systems   Review of Systems  Constitutional:  Negative for chills and fever.  HENT:  Negative for congestion.   Eyes:  Negative for pain.  Respiratory:  Negative for cough and shortness of breath.   Cardiovascular:  Negative for chest pain and leg swelling.  Gastrointestinal:  Positive for abdominal pain. Negative for diarrhea, nausea and vomiting.  Genitourinary:  Negative for dysuria.  Musculoskeletal:   Negative for myalgias.  Skin:  Negative for rash.  Neurological:  Negative for dizziness and headaches.   Physical Exam Updated Vital Signs BP (!) 152/83   Pulse 60   Temp 98 F (36.7 C) (Oral)   Resp 13   Ht  (1.651 m)   Wt 81.6 kg   SpO2 98%   BMI 29.95 kg/m   Physical Exam Vitals and nursing note reviewed.  Constitutional:      General: He is not in acute distress.    Comments: Pleasant well-appearing 70 year old.  In no acute distress.  Sitting comfortably in bed.  Able answer questions appropriately follow commands. No increased work of breathing. Speaking in full sentences.  HENT:     Head: Normocephalic and atraumatic.     Nose: Nose normal.  Eyes:     General: No scleral icterus. Cardiovascular:     Rate and Rhythm: Normal rate and regular rhythm.     Pulses: Normal pulses.     Heart sounds: Normal heart sounds.  Pulmonary:     Effort: Pulmonary effort is normal. No respiratory distress.     Breath sounds: No wheezing.  Abdominal:     Palpations: Abdomen is soft.     Tenderness: There is no abdominal tenderness. There is no right CVA tenderness, left CVA tenderness, guarding or rebound. Negative signs include Murphy's sign and McBurney's sign.     Comments: Abdomen is soft nontender.  No guarding or rebound no bruising. Bowel sounds present.  Musculoskeletal:     Cervical back: Normal range of motion.     Right lower leg: No edema.     Left lower leg: No edema.  Skin:    General: Skin is warm and dry.     Capillary Refill: Capillary refill takes less than 2 seconds.  Neurological:     Mental Status: He is alert. Mental status is at baseline.  Psychiatric:        Mood and Affect: Mood normal.        Behavior: Behavior normal.    ED Results / Procedures / Treatments   Labs (all labs ordered are listed, but only abnormal results are displayed) Labs Reviewed  COMPREHENSIVE METABOLIC PANEL - Abnormal; Notable for the following components:      Result  Value   Glucose, Bld 141 (*)    All other components within normal limits  LIPASE, BLOOD - Abnormal; Notable for the following components:   Lipase 87 (*)    All other components within normal limits  CBC WITH DIFFERENTIAL/PLATELET  URINALYSIS, ROUTINE W REFLEX MICROSCOPIC    EKG EKG Interpretation  Date/Time:  Saturday November 05 2021 10:55:30 EST Ventricular Rate:  57 PR Interval:  188 QRS Duration: 160 QT Interval:  467 QTC Calculation: 455 R Axis:   54 Text Interpretation: Sinus rhythm IVCD, consider atypical LBBB Confirmed by Lockie Mola, Adam ([id number]) on 11/05/2021 11:01:13 AM  Radiology No results found.  Procedures Procedures   Medications Ordered in ED Medications  pantoprazole (PROTONIX) injection 40 mg (40 mg Intravenous Given 11/05/21 1059)  sucralfate (CARAFATE) 1 GM/10ML suspension 1 g (1 g Oral Given 11/05/21 1059)    ED Course  I have reviewed the triage vital signs and the nursing notes.  Pertinent labs & imaging results that were available during my care of the patient were reviewed by me and considered in my medical decision making (see chart for details).    MDM Rules/Calculators/A&P                          Patient is 70 year old male presented to ER today with complaints of left lower quadrant abdominal pain for 1.5 weeks.  States that his symptoms been persistent.  He was seen in urgent care and had an x-ray of his chest/left ribs which did not show any fracture.  There is no history of trauma.  It is somewhat muscular sounding with it being worsened with certain movements specifically bending over however is not reproducible on my examination of the abdomen.  No abdominal tenderness.  No guarding rebound.  Patient is well-appearing on examination. Abdominal exam is benign.  Patient provided IV Protonix and  Carafate.   I personally reviewed all laboratory work and imaging.  Metabolic panel without any acute abnormality specifically kidney function  within normal limits and no significant electrolyte abnormalities. CBC without leukocytosis or significant anemia.  Lipase within normal limits at pancreatitis.  Urinalysis unremarkable.  EKG nonischemic persistent left bundle branch block no acute changes.  On reassessment patient states that he is pain-free. Given location and chronicity of pain straight leg considering peptic ulcer disease likely nonbleeding given no dark or tarry stools hemoglobin normal perhaps gastritis.  Unlikely to be reflux given he is not having any chest or esophageal discomfort or dyspepsia, nausea or vomiting.  Patient symptoms improved dramatically here although he is having only minimal pain at baseline anyway.  He already has a gastroenterology appointment and follow-up recommends calling to see if physically made more expediently given that it is over a month away.  Discharged home with very strict return precautions.  I discussed the case with Dr. Lockie Mola prior to discharge of this patient.  All of patient's questions answered to the best of my ability.  Tolerated p.o. during ER visit and no complaints of nausea or vomiting.  Ambulatory at time of discharge.  Vital signs within normal limits apart from mildly elevated blood pressure.  Final Clinical Impression(s) / ED Diagnoses Final diagnoses:  LUQ abdominal pain    Rx / DC Orders ED Discharge Orders          Ordered    pantoprazole (PROTONIX) 20 MG tablet  Daily        11/05/21 1132    sucralfate (CARAFATE) 1 g tablet  3 times daily with meals & bedtime        11/05/21 1132             Gailen Shelter, PA 11/05/21 1240    Virgina Norfolk, DO 11/05/21 1244

## 2021-11-05 NOTE — ED Notes (Signed)
ED Provider at bedside. 

## 2021-11-05 NOTE — ED Notes (Signed)
Pt aware urine specimen ordered. Pt reports inability to provide specimen at this time. Specimen collection device provided to patient. 

## 2021-11-05 NOTE — Discharge Instructions (Addendum)
Your work-up today was quite reassuring.  I am glad that your symptoms have improved.  Please take medications I prescribed as prescribed.  Please see your gastroenterologist immediately keep your appointment or call to inquire about being seen sooner -- 2 to 3 weeks.  Return plenty of water.  Follow the liquid diet that I have printed out for you.  As we discussed, your lipase which is an enzyme made by your pancreas is slightly elevated today likely is an incidental finding however avoid alcohol for the time being.   Ultimately gastritis -- irritation of the stomach lining -- or an ulcer of your stomach are likely causes of your symptoms.   Avoid ibuprofen/aleve/naproxen/meloxicam as well as alcohol.   Return to the ER for new/concerning symptoms as discussed.

## 2021-11-12 IMAGING — CR DG RIBS W/ CHEST 3+V*R*
3 series · 3 of 3 positions shown · non-contrast
Comparison: 05/27/2018

CLINICAL DATA: RIGHT-sided chest pain

EXAM:
RIGHT RIBS AND CHEST - 3+ VIEW

[w chest pa]
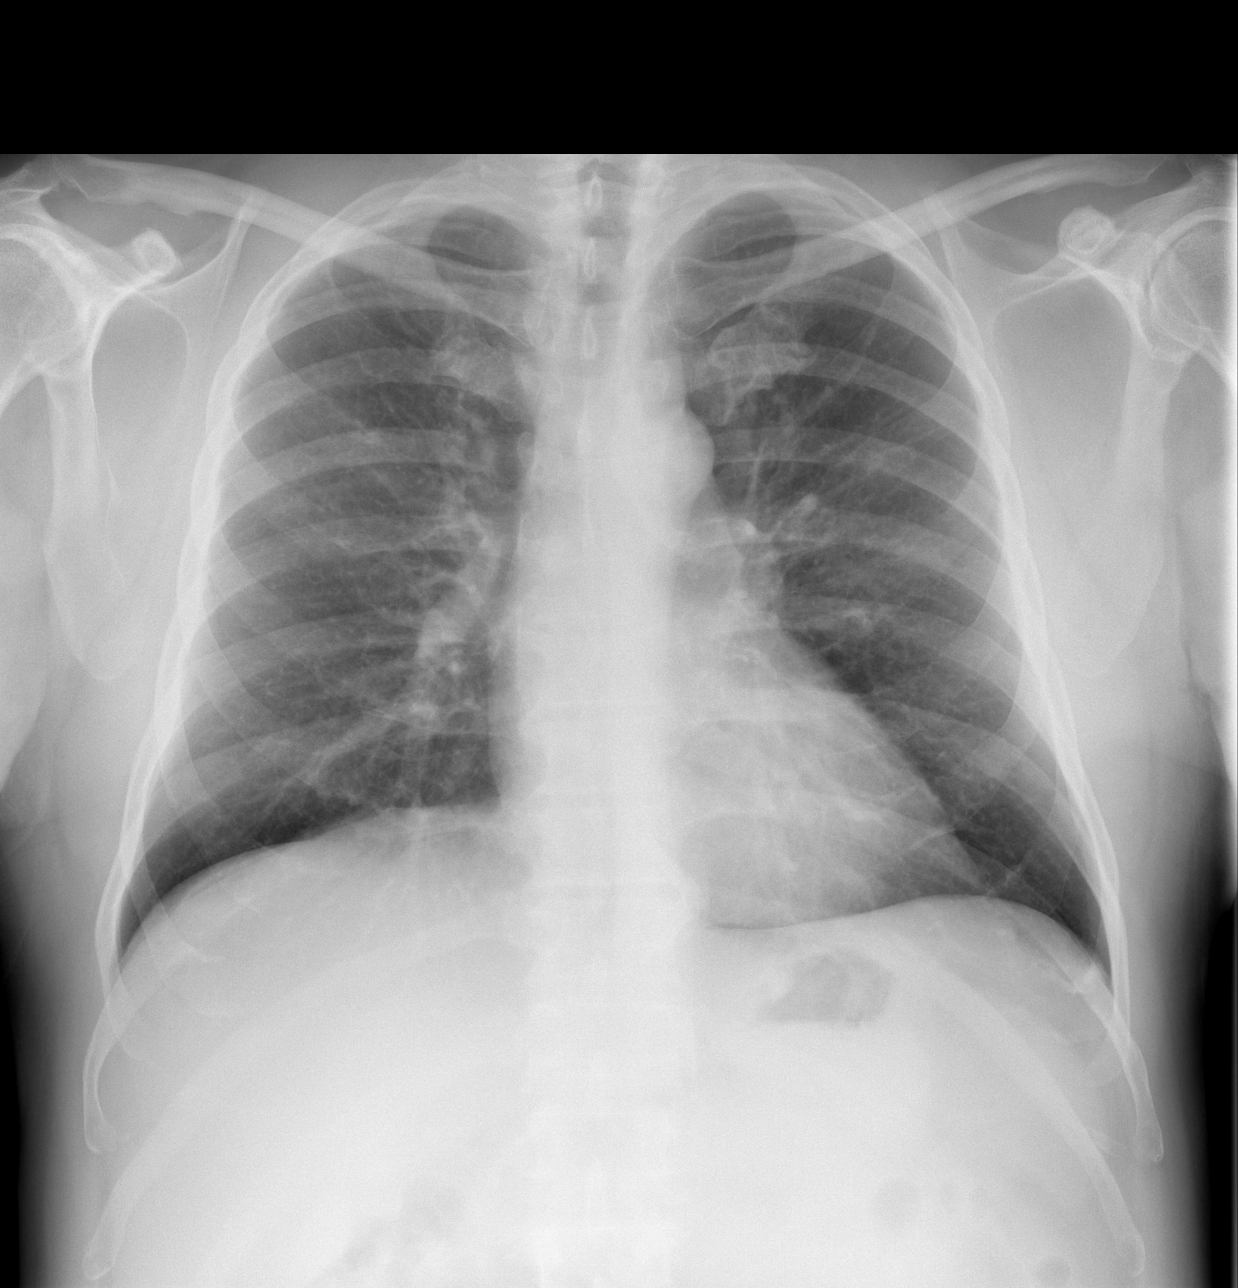

[w ribs ap/pa upper right]
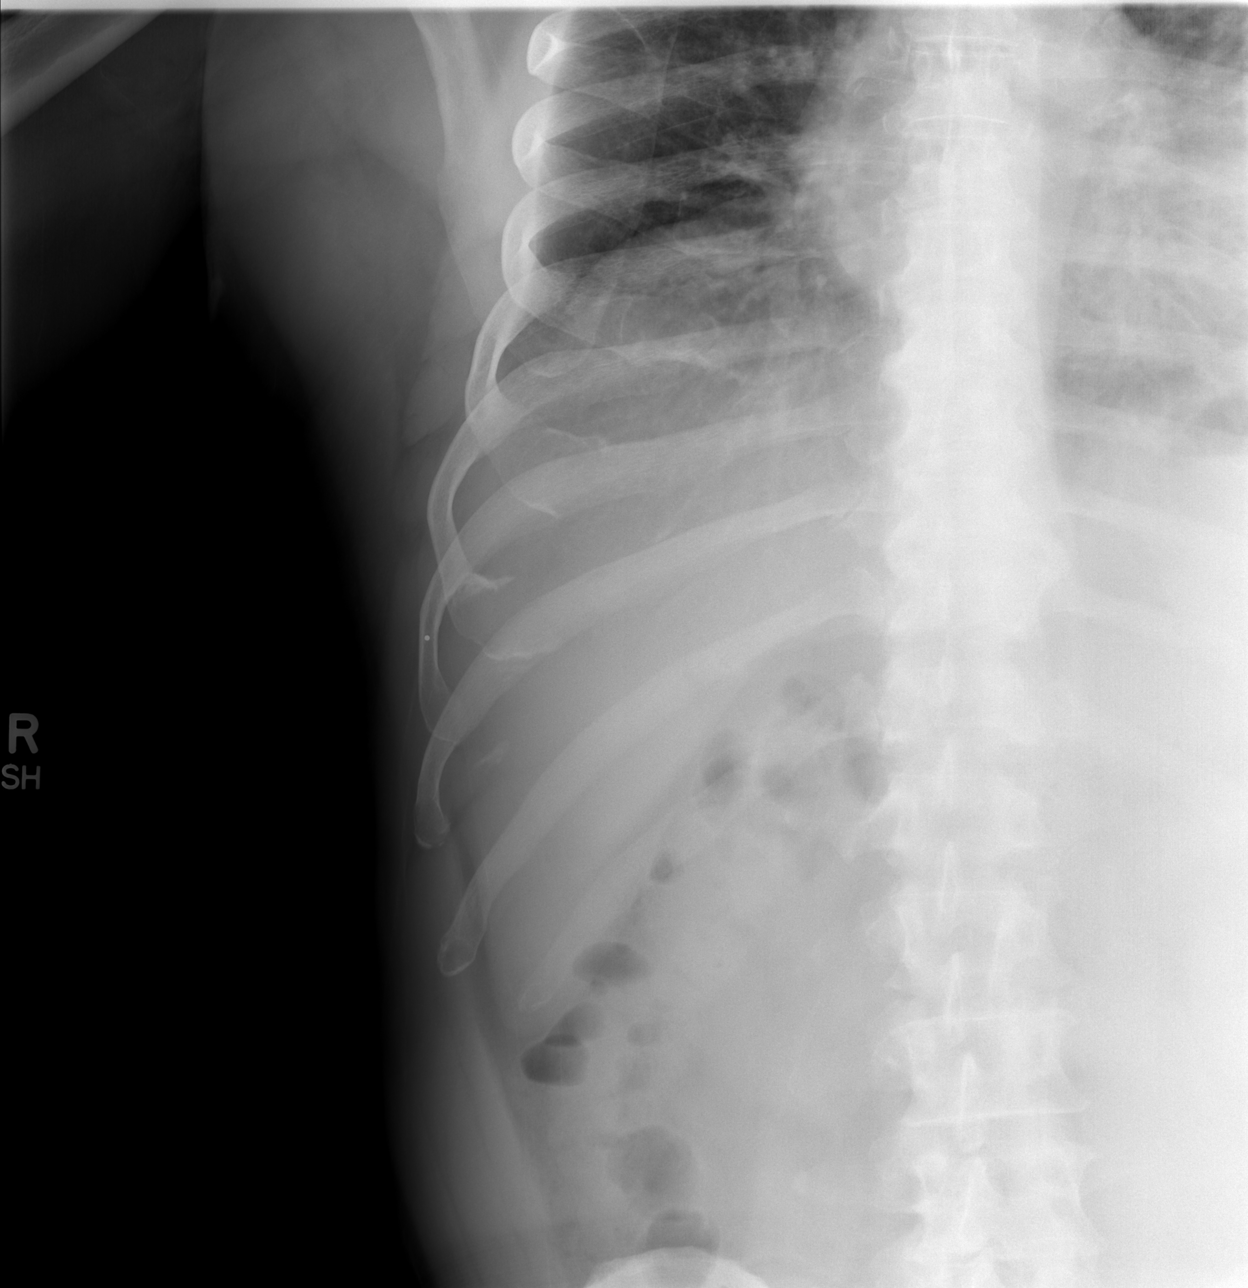

[w ribs oblique right]
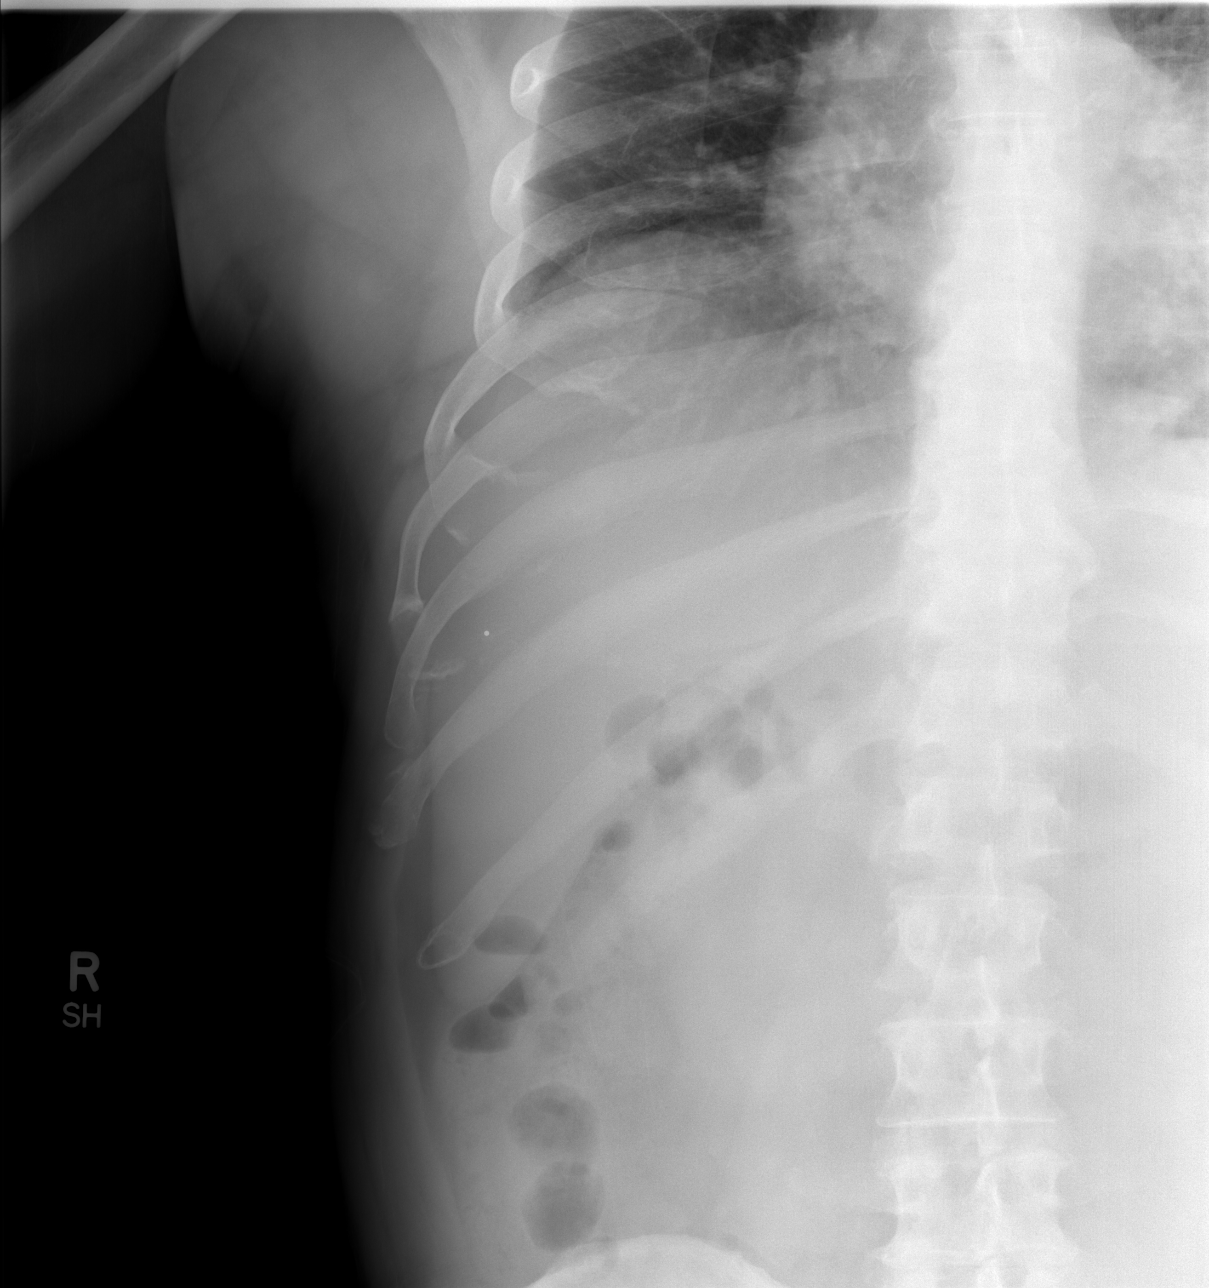

[3 of 3 positions shown; findings below may reference images not displayed]

FINDINGS: Normal mediastinum and cardiac silhouette. Normal pulmonary
vasculature. No evidence of effusion, infiltrate, or pneumothorax.
No acute bony abnormality.

Dedicated views of the RIGHT ribs demonstrates no fracture
IMPRESSION: No acute cardiopulmonary process.  No rib fracture.

## 2021-12-30 ENCOUNTER — Encounter: Payer: Self-pay | Admitting: Physical Therapy

## 2021-12-30 NOTE — Therapy (Signed)
[address]. [place], [place], [address] Phone: [phone]   Fax:  [phone]  Patient Details  Name: [name] MRN: [id number] Date of Birth: 1951/02/22 Referring Provider:  No ref. provider found  Encounter Date: 12/30/2021  PHYSICAL THERAPY DISCHARGE SUMMARY  Visits from Start of Care: 15  Current functional level related to goals / functional outcomes: Last seen in August 2022, did not return for further appts; [place] complete on 12/30/21   Remaining deficits: Unable to assess    Education / Equipment: N/A    Patient agrees to discharge. Patient goals were partially met. Patient is being discharged due to not returning since the last visit.  Windell Norfolk, DPT, PN2   Supplemental Physical Therapist [place]    Pager [phone] Acute Rehab Office [address]. [place], [place], [address] Phone: [phone]   Fax:  [phone]

## 2022-12-24 ENCOUNTER — Encounter (HOSPITAL_BASED_OUTPATIENT_CLINIC_OR_DEPARTMENT_OTHER): Payer: Self-pay | Admitting: Emergency Medicine

## 2022-12-24 ENCOUNTER — Emergency Department (HOSPITAL_BASED_OUTPATIENT_CLINIC_OR_DEPARTMENT_OTHER)
Admission: EM | Admit: 2022-12-24 | Discharge: 2022-12-24 | Disposition: A | Discharge status: Home / Self Care | Payer: Medicare Other | Attending: Emergency Medicine | Admitting: Emergency Medicine

## 2022-12-24 ENCOUNTER — Other Ambulatory Visit: Payer: Self-pay

## 2022-12-24 ENCOUNTER — Emergency Department (HOSPITAL_BASED_OUTPATIENT_CLINIC_OR_DEPARTMENT_OTHER): Payer: Medicare Other

## 2022-12-24 DIAGNOSIS — R6881 Early satiety: Secondary | ICD-10-CM | POA: Insufficient documentation

## 2022-12-24 DIAGNOSIS — R109 Unspecified abdominal pain: Secondary | ICD-10-CM | POA: Insufficient documentation

## 2022-12-24 LAB — COMPREHENSIVE METABOLIC PANEL
ALT: 21 U/L (ref 0–44)
AST: 25 U/L (ref 15–41)
Albumin: 4.3 g/dL (ref 3.5–5.0)
Alkaline Phosphatase: 34 U/L — ABNORMAL LOW (ref 38–126)
Anion gap: 8 (ref 5–15)
BUN: 23 mg/dL (ref 8–23)
CO2: 23 mmol/L (ref 22–32)
Calcium: 9.1 mg/dL (ref 8.9–10.3)
Chloride: 106 mmol/L (ref 98–111)
Creatinine, Ser: 1.07 mg/dL (ref 0.61–1.24)
GFR, Estimated: >60 mL/min (ref >60)
Glucose, Bld: 121 mg/dL — ABNORMAL HIGH (ref 70–99)
Potassium: 4.4 mmol/L (ref 3.5–5.1)
Sodium: 137 mmol/L (ref 135–145)
Total Bilirubin: 0.8 mg/dL (ref 0.3–1.2)
Total Protein: 7.2 g/dL (ref 6.5–8.1)

## 2022-12-24 LAB — CBC WITH DIFFERENTIAL/PLATELET
Abs Immature Granulocytes: 0.02 10*3/uL (ref 0.00–0.07)
Basophils Absolute: 0.1 10*3/uL (ref 0.0–0.1)
Basophils Relative: 1 %
Eosinophils Absolute: 0.3 10*3/uL (ref 0.0–0.5)
Eosinophils Relative: 5 %
HCT: 37.3 % — ABNORMAL LOW (ref 39.0–52.0)
Hemoglobin: 12.7 g/dL — ABNORMAL LOW (ref 13.0–17.0)
Immature Granulocytes: 0 %
Lymphocytes Relative: 32 %
Lymphs Abs: 1.7 10*3/uL (ref 0.7–4.0)
MCH: 31.6 pg (ref 26.0–34.0)
MCHC: 34 g/dL (ref 30.0–36.0)
MCV: 92.8 fL (ref 80.0–100.0)
Monocytes Absolute: 0.6 10*3/uL (ref 0.1–1.0)
Monocytes Relative: 12 %
Neutro Abs: 2.7 10*3/uL (ref 1.7–7.7)
Neutrophils Relative %: 50 %
Platelets: 305 10*3/uL (ref 150–400)
RBC: 4.02 MIL/uL — ABNORMAL LOW (ref 4.22–5.81)
RDW: 12.9 % (ref 11.5–15.5)
WBC: 5.4 10*3/uL (ref 4.0–10.5)
nRBC: 0 % (ref 0.0–0.2)

## 2022-12-24 LAB — LIPASE, BLOOD: Lipase: 55 U/L — ABNORMAL HIGH (ref 11–51)

## 2022-12-24 MED ORDER — SODIUM CHLORIDE 0.9 % IV BOLUS
1000.0000 mL | Freq: Once | INTRAVENOUS | Status: AC
  Administered 2022-12-24: 1000 mL via INTRAVENOUS

## 2022-12-24 MED ORDER — OMEPRAZOLE 20 MG PO CPDR: 20.0000 mg | DELAYED_RELEASE_CAPSULE | Freq: Two times a day (BID) | ORAL | 0 refills | Status: DC

## 2022-12-24 MED ORDER — IOHEXOL 300 MG/ML  SOLN
100.0000 mL | Freq: Once | INTRAMUSCULAR | Status: AC | PRN
  Administered 2022-12-24: 100 mL via INTRAVENOUS

## 2022-12-24 NOTE — Discharge Instructions (Signed)
Recommend close follow-up with GI specialist for early satiety (fullness after eating) and abdominal discomfort after eating for considerations for possible upper endoscopy.  Recommendations to reach out to primary care physician for barium swallow study orders.  Currently at [facility] we do not have the ability to complete a barium swallow study.  I think this imaging will be useful prior to seeing GI specialist to help point diagnosis.  Omeprazole recommended upon discharge and sent to pharmacy.  Please return to emergency department immediately for any worsening concerning signs or symptoms including inability to tolerate fluids.  Recommended for small meals multiple times a day or protein shakes and/or [employer] if inability to tolerate solids.

## 2022-12-24 NOTE — ED Triage Notes (Signed)
Pt arrives pov, steady gait with c/o LUQ pain described as "pressure" x 1 week that occurs after eating. Denies n/v or fever

## 2022-12-24 NOTE — ED Notes (Signed)
Patient transported to CT 

## 2022-12-24 NOTE — ED Provider Notes (Signed)
[facility] [place] [facility] [place] Provider Note   CSN: [id number] Arrival date & time: 12/24/22  8657     History  Chief Complaint  Patient presents with   Abdominal Pain    [name] is a 72 y.o. male.  Patient is a 72 year old male presenting for abdominal fullness.  States over the last 2 weeks he has had early satiety and fullness sensation in the left upper quadrant shortly after eating a small meal.  States it has gotten more severe and he has decreased his oral intake.  He is able to tolerate fluids at this time.  He denies any history of gastritis, peptic ulcer disease, or delayed gastric emptying.  He denies a prior history of GERD, feelings of acid reflux, heartburn, or indigestion.  He denies a history of chest pain shortness of breath, or epigastric pain.  He denies nausea, vomiting, constipation, or diarrhea.  Denies black or bloody stools.  Abdominal surgical history: Perforated diverticulitis with partial colectomy and ostomy.  Ostomy reversal with hernia at ostomy site with repair.  Seen in 2022 by gastroenterologist Dr. Noralee Stain through [place] for screening colonoscopy prior to surgery.  The history is provided by the patient. No language interpreter was used.  Abdominal Pain Associated symptoms: no chest pain, no chills, no cough, no dysuria, no fever, no hematuria, no shortness of breath, no sore throat and no vomiting        Home Medications Prior to Admission medications   Medication Sig Start Date End Date Taking? Authorizing Provider  omeprazole (PRILOSEC) 20 MG capsule Take 1 capsule (20 mg total) by mouth 2 (two) times daily at 8 am and 10 pm for 14 days. 12/24/22 [birth year] Yes Campbell Stall P, DO  albuterol (PROVENTIL HFA;VENTOLIN HFA) 108 (90 BASE) MCG/ACT inhaler Inhale 2 puffs into the lungs every 6 (six) hours as needed for wheezing.     [provider]  allopurinol (ZYLOPRIM) 100 MG tablet Take 100 mg by mouth daily.     [provider]  allopurinol (ZYLOPRIM) 300 MG tablet Take 300 mg by mouth daily.    [provider]  Ascorbic Acid (VITAMIN C) 1000 MG tablet Take 1,000 mg by mouth daily.    [provider]  CINNAMON PO Take 1,000 mg by mouth daily.    [provider]  diclofenac Sodium (VOLTAREN) 1 % GEL Apply 2 g topically 4 (four) times daily. 12/30/19   Long, Wonda Olds, MD  fenofibrate 160 MG tablet Take 160 mg by mouth at bedtime.     [provider]  lisinopril (PRINIVIL,ZESTRIL) 40 MG tablet Take 40 mg by mouth daily.  05/01/18   [provider]  methocarbamol (ROBAXIN) 500 MG tablet Take 1 tablet (500 mg total) by mouth every 8 (eight) hours as needed for muscle spasms. 12/30/19   Long, Wonda Olds, MD  Multiple Vitamin (MULTIVITAMIN WITH MINERALS) TABS tablet Take 1 tablet by mouth daily.    [provider]  oxyCODONE (OXY IR/ROXICODONE) 5 MG immediate release tablet Take 1-2 tablets (5-10 mg total) by mouth every 4 (four) hours as needed for moderate pain. 04/15/19   Armandina Gemma, MD  pantoprazole (PROTONIX) 20 MG tablet Take 1 tablet (20 mg total) by mouth daily. 11/05/21   Tedd Sias, PA  polyvinyl alcohol (ARTIFICIAL TEARS) 1.4 % ophthalmic solution Place 1 drop into both eyes 2 (two) times a day.     [provider]  psyllium (METAMUCIL) 58.6 %  packet Take 1 packet by mouth at bedtime.     [provider]  rosuvastatin (CRESTOR) 40 MG tablet Take 20 mg by mouth at bedtime.     [provider]  sucralfate (CARAFATE) 1 g tablet Take 1 tablet (1 g total) by mouth 4 (four) times daily -  with meals and at bedtime. 11/05/21   Tedd Sias, PA      Allergies    Hibiclens [chlorhexidine gluconate], Ciprofloxacin, Flagyl [metronidazole], and Nabumetone    Review of Systems   Review of Systems  Constitutional:  Negative for chills and fever.  HENT:  Negative for ear pain and sore throat.   Eyes:  Negative for pain  and visual disturbance.  Respiratory:  Negative for cough and shortness of breath.   Cardiovascular:  Negative for chest pain and palpitations.  Gastrointestinal:  Positive for abdominal pain. Negative for vomiting.  Genitourinary:  Negative for dysuria and hematuria.  Musculoskeletal:  Negative for arthralgias and back pain.  Skin:  Negative for color change and rash.  Neurological:  Negative for seizures and syncope.  All other systems reviewed and are negative.   Physical Exam Updated Vital Signs BP 134/74   Pulse (!) 56   Temp (!) 97.5 F (36.4 C) (Oral)   Resp 16   Ht 5\' 5"  (1.651 m)   Wt 80.3 kg   SpO2 100%   BMI 29.45 kg/m  Physical Exam Vitals and nursing note reviewed.  Constitutional:      General: He is not in acute distress.    Appearance: He is well-developed.  HENT:     Head: Normocephalic and atraumatic.  Eyes:     Conjunctiva/sclera: Conjunctivae normal.  Cardiovascular:     Rate and Rhythm: Normal rate and regular rhythm.     Heart sounds: No murmur heard. Pulmonary:     Effort: Pulmonary effort is normal. No respiratory distress.     Breath sounds: Normal breath sounds.  Abdominal:     Palpations: Abdomen is soft.     Tenderness: There is no abdominal tenderness.  Musculoskeletal:        General: No swelling.     Cervical back: Neck supple.  Skin:    General: Skin is warm and dry.     Capillary Refill: Capillary refill takes less than 2 seconds.  Neurological:     Mental Status: He is alert.  Psychiatric:        Mood and Affect: Mood normal.     ED Results / Procedures / Treatments   Labs (all labs ordered are listed, but only abnormal results are displayed) Labs Reviewed  CBC WITH DIFFERENTIAL/PLATELET - Abnormal; Notable for the following components:      Result Value   RBC 4.02 (*)    Hemoglobin 12.7 (*)    HCT 37.3 (*)    All other components within normal limits  COMPREHENSIVE METABOLIC PANEL - Abnormal; Notable for the following  components:   Glucose, Bld 121 (*)    Alkaline Phosphatase 34 (*)    All other components within normal limits  LIPASE, BLOOD - Abnormal; Notable for the following components:   Lipase 55 (*)    All other components within normal limits    EKG None  Radiology CT ABDOMEN PELVIS W CONTRAST  Result Date: 12/24/2022 CLINICAL DATA:  Epigastric and left upper quadrant pain EXAM: CT ABDOMEN AND PELVIS WITH CONTRAST TECHNIQUE: Multidetector CT imaging of the abdomen and pelvis was performed using the standard  protocol following bolus administration of intravenous contrast. RADIATION DOSE REDUCTION: This exam was performed according to the departmental dose-optimization program which includes automated exposure control, adjustment of the mA and/or kV according to patient size and/or use of iterative reconstruction technique. CONTRAST:  OMNIPAQUE IOHEXOL 300 MG/ML  SOLN COMPARISON:  CT abdomen pelvis 05/17/2018 FINDINGS: Lower chest: Normal heart size. Lung bases are clear. No pleural effusion. Hepatobiliary: The liver is normal in size and contour. No focal lesion. Multiple stones in the gallbladder lumen. Adenomyomatosis of the gallbladder fundus. Pancreas: Findings suggestive of an annular pancreas surrounding the adjacent duodenum. Spleen: Normal in size without focal abnormality. Adrenals/Urinary Tract: Normal adrenal glands. Kidneys enhance symmetrically with contrast. No hydronephrosis. Urinary bladder is unremarkable. Stomach/Bowel: Surgical anastomosis involving the sigmoid colon. Adjacent to the anastomosis is a 3.2 x 2.7 cm round dense structure (image 62; series 2). Descending colonic diverticulosis. No CT evidence for acute diverticulitis. No evidence for bowel obstruction. No free fluid or free intraperitoneal air. Vascular/Lymphatic: Normal caliber abdominal aorta. Peripheral calcified atherosclerotic plaque. No retroperitoneal lymphadenopathy. Reproductive: Heterogeneous prostate Other:  None Musculoskeletal: Lumbar spine degenerative changes. No aggressive or acute appearing osseous lesions. IMPRESSION: 1. There is a 3.2 cm round dense structure adjacent to the surgical anastomosis within the sigmoid colon. This is nonspecific in etiology however likely represents a small postsurgical diverticulum containing dense material. There is no surrounding inflammatory stranding. 2. Cholelithiasis. 3. No acute process within the abdomen or pelvis. Electronically Signed   By: Annia Belt M.D.   On: 12/24/2022 09:39    Procedures Procedures    Medications Ordered in ED Medications  iohexol (OMNIPAQUE) 300 MG/ML solution 100 mL (100 mLs Intravenous Contrast Given 12/24/22 0913)  sodium chloride 0.9 % bolus 1,000 mL (1,000 mLs Intravenous New Bag/Given 12/24/22 7341)    ED Course/ Medical Decision Making/ A&P                             Medical Decision Making Amount and/or Complexity of Data Reviewed Labs: ordered. Radiology: ordered.  Risk Prescription drug management.   77:36 AM 72 year old male presenting for abdominal fullness.  Patient is alert and oriented x 3, no acute distress, afebrile, stable vital signs.  Physical exam demonstrates soft abdomen with no distention.  No guarding or rigidity.  No tenderness on palpation.  Patient demonstrates the ability to tolerate secretions.  Able to tolerate fluids at this time.  Differential diagnosis for patient's symptoms includes but is not limited to bowel obstruction, peptic ulcer disease, gastritis, delayed gastric emptying, etc.    Laboratory studies/Imaging: Laboratory studies demonstrate no leukocytosis.  No signs or symptoms of sepsis.  Stable liver profile, lipase, and renal function.  No gross dehydration.  IV fluids given due to decreased p.o. intake.  CT abdomen and pelvis with IV contrast demonstrates no large masses.  No bowel obstruction.  No pancreatitis.  Please see report for further details.  No significant findings  to explain patient's symptoms at this time.   Recommendations: Recommend close follow-up with GI specialist for likely upper endoscopy.  Patient also recommended to reach out to primary care physician for barium swallow study orders.  Currently at [facility] we do not have the ability to complete a barium swallow study.  I think this imaging will be useful prior to seeing GI specialist to help point diagnosis.  Omeprazole recommended upon discharge and sent to pharmacy.  Patient recommended to  return to emergency department immediately for any worsening concerning signs or symptoms including inability to tolerate fluids.  Recommended for small meals multiple times a day or protein shakes and/or [employer] if inability to tolerate solids.  Patient in no distress and overall condition improved here in the ED. Detailed discussions were had with the patient regarding current findings, and need for close f/u with PCP or on call doctor. The patient has been instructed to return immediately if the symptoms worsen in any way for re-evaluation. Patient verbalized understanding and is in agreement with current care plan. All questions answered prior to discharge.         Final Clinical Impression(s) / ED Diagnoses Final diagnoses:  Early satiety    Rx / DC Orders ED Discharge Orders          Ordered    omeprazole (PRILOSEC) 20 MG capsule  2 times daily before breakfast and at bedtime        12/24/22 1024              Franne Forts, DO 12/24/22 1024

## 2022-12-24 NOTE — ED Notes (Signed)
Pt discharged to home. Discharge instructions have been discussed with patient and/or family members. Pt verbally acknowledges understanding d/c instructions, and endorses comprehension to checkout at registration before leaving.  °

## 2022-12-24 NOTE — ED Notes (Addendum)
Pt presents with LUQ pain that radiates under ribs, states he feels full after only eating a few bites of food. Abdomen non painful on palpation, hyperactive bowel sounds present. No bruits present. Last BM this morning Patient states he had a colostomy with reversal that caused hernia in past that has been repaired.  Patient is ambulatory, A&Ox4, friend is present at bedside.  Cardiac and respiratory systems WNL.  20g IV placed in right AC, 1 insertion attempt, patient tolerated it well. IV saline flushed, locked and had blood return. Transparent dressing in place.

## 2023-04-17 ENCOUNTER — Observation Stay (HOSPITAL_BASED_OUTPATIENT_CLINIC_OR_DEPARTMENT_OTHER)
Admission: EM | Admit: 2023-04-17 | Discharge: 2023-04-18 | Disposition: A | Discharge status: Home / Self Care | Payer: Medicare Other | Attending: Internal Medicine | Admitting: Internal Medicine

## 2023-04-17 ENCOUNTER — Emergency Department (HOSPITAL_BASED_OUTPATIENT_CLINIC_OR_DEPARTMENT_OTHER): Payer: Medicare Other

## 2023-04-17 ENCOUNTER — Other Ambulatory Visit: Payer: Self-pay

## 2023-04-17 ENCOUNTER — Encounter (HOSPITAL_BASED_OUTPATIENT_CLINIC_OR_DEPARTMENT_OTHER): Payer: Self-pay | Admitting: Internal Medicine

## 2023-04-17 ENCOUNTER — Observation Stay (HOSPITAL_COMMUNITY): Payer: Medicare Other

## 2023-04-17 DIAGNOSIS — M109 Gout, unspecified: Secondary | ICD-10-CM | POA: Insufficient documentation

## 2023-04-17 DIAGNOSIS — E785 Hyperlipidemia, unspecified: Secondary | ICD-10-CM

## 2023-04-17 DIAGNOSIS — I110 Hypertensive heart disease with heart failure: Secondary | ICD-10-CM | POA: Diagnosis not present

## 2023-04-17 DIAGNOSIS — E119 Type 2 diabetes mellitus without complications: Secondary | ICD-10-CM | POA: Diagnosis not present

## 2023-04-17 DIAGNOSIS — R55 Syncope and collapse: Secondary | ICD-10-CM | POA: Diagnosis present

## 2023-04-17 DIAGNOSIS — Z87891 Personal history of nicotine dependence: Secondary | ICD-10-CM | POA: Diagnosis not present

## 2023-04-17 DIAGNOSIS — I5022 Chronic systolic (congestive) heart failure: Secondary | ICD-10-CM | POA: Insufficient documentation

## 2023-04-17 DIAGNOSIS — R569 Unspecified convulsions: Secondary | ICD-10-CM

## 2023-04-17 DIAGNOSIS — G459 Transient cerebral ischemic attack, unspecified: Secondary | ICD-10-CM | POA: Diagnosis not present

## 2023-04-17 DIAGNOSIS — I10 Essential (primary) hypertension: Secondary | ICD-10-CM

## 2023-04-17 DIAGNOSIS — Z79899 Other long term (current) drug therapy: Secondary | ICD-10-CM | POA: Insufficient documentation

## 2023-04-17 DIAGNOSIS — J45909 Unspecified asthma, uncomplicated: Secondary | ICD-10-CM | POA: Diagnosis not present

## 2023-04-17 LAB — CBC
HCT: 35.7 % — ABNORMAL LOW (ref 39.0–52.0)
Hemoglobin: 12.4 g/dL — ABNORMAL LOW (ref 13.0–17.0)
MCH: 31.8 pg (ref 26.0–34.0)
MCHC: 34.7 g/dL (ref 30.0–36.0)
MCV: 91.5 fL (ref 80.0–100.0)
Platelets: 274 10*3/uL (ref 150–400)
RBC: 3.9 MIL/uL — ABNORMAL LOW (ref 4.22–5.81)
RDW: 13.1 % (ref 11.5–15.5)
WBC: 5.8 10*3/uL (ref 4.0–10.5)
nRBC: 0 % (ref 0.0–0.2)

## 2023-04-17 LAB — BASIC METABOLIC PANEL
Anion gap: 10 (ref 5–15)
BUN: 27 mg/dL — ABNORMAL HIGH (ref 8–23)
CO2: 21 mmol/L — ABNORMAL LOW (ref 22–32)
Calcium: 8.8 mg/dL — ABNORMAL LOW (ref 8.9–10.3)
Chloride: 104 mmol/L (ref 98–111)
Creatinine, Ser: 1.24 mg/dL (ref 0.61–1.24)
GFR, Estimated: >60 mL/min (ref >60)
Glucose, Bld: 178 mg/dL — ABNORMAL HIGH (ref 70–99)
Potassium: 4 mmol/L (ref 3.5–5.1)
Sodium: 135 mmol/L (ref 135–145)

## 2023-04-17 LAB — URINALYSIS, ROUTINE W REFLEX MICROSCOPIC
Bilirubin Urine: NEGATIVE
Glucose, UA: NEGATIVE mg/dL
Hgb urine dipstick: NEGATIVE
Ketones, ur: NEGATIVE mg/dL
Leukocytes,Ua: NEGATIVE
Nitrite: NEGATIVE
Protein, ur: NEGATIVE mg/dL
Specific Gravity, Urine: 1.025 (ref 1.005–1.030)
pH: 5.5 (ref 5.0–8.0)

## 2023-04-17 LAB — GLUCOSE, CAPILLARY: Glucose-Capillary: 87 mg/dL (ref 70–99)

## 2023-04-17 LAB — TROPONIN I (HIGH SENSITIVITY): Troponin I (High Sensitivity): 7 ng/L (ref <18)

## 2023-04-17 MED ORDER — IOHEXOL 350 MG/ML SOLN
75.0000 mL | Freq: Once | INTRAVENOUS | Status: AC | PRN
  Administered 2023-04-17: 75 mL via INTRAVENOUS

## 2023-04-17 MED ORDER — ALLOPURINOL 100 MG PO TABS
100.0000 mg | ORAL_TABLET | Freq: Every day | ORAL | Status: DC
  Administered 2023-04-18: 100 mg via ORAL
  Filled 2023-04-17: qty 1

## 2023-04-17 MED ORDER — ACETAMINOPHEN 650 MG RE SUPP: 650.0000 mg | Freq: Four times a day (QID) | RECTAL | Status: DC | PRN

## 2023-04-17 MED ORDER — OXYCODONE HCL 5 MG PO TABS: 5.0000 mg | ORAL_TABLET | ORAL | Status: DC | PRN

## 2023-04-17 MED ORDER — METHOCARBAMOL 500 MG PO TABS: 500.0000 mg | ORAL_TABLET | Freq: Three times a day (TID) | ORAL | Status: DC | PRN

## 2023-04-17 MED ORDER — ALBUTEROL SULFATE (2.5 MG/3ML) 0.083% IN NEBU
3.0000 mL | INHALATION_SOLUTION | Freq: Four times a day (QID) | RESPIRATORY_TRACT | Status: DC | PRN
  Administered 2023-04-18: 3 mL via RESPIRATORY_TRACT
  Filled 2023-04-17: qty 3

## 2023-04-17 MED ORDER — PANTOPRAZOLE SODIUM 40 MG PO TBEC
40.0000 mg | DELAYED_RELEASE_TABLET | Freq: Every day | ORAL | Status: DC
  Administered 2023-04-18: 40 mg via ORAL
  Filled 2023-04-17: qty 1

## 2023-04-17 MED ORDER — ENOXAPARIN SODIUM 40 MG/0.4ML IJ SOSY
40.0000 mg | PREFILLED_SYRINGE | INTRAMUSCULAR | Status: DC
  Administered 2023-04-18: 40 mg via SUBCUTANEOUS
  Filled 2023-04-17: qty 0.4

## 2023-04-17 MED ORDER — ROSUVASTATIN CALCIUM 20 MG PO TABS
20.0000 mg | ORAL_TABLET | Freq: Every day | ORAL | Status: DC
  Administered 2023-04-17: 20 mg via ORAL
  Filled 2023-04-17: qty 4

## 2023-04-17 MED ORDER — STROKE: EARLY STAGES OF RECOVERY BOOK
Freq: Once | Status: AC
  Filled 2023-04-17: qty 1

## 2023-04-17 MED ORDER — FENOFIBRATE 160 MG PO TABS
160.0000 mg | ORAL_TABLET | Freq: Every day | ORAL | Status: DC
  Administered 2023-04-17: 160 mg via ORAL
  Filled 2023-04-17: qty 1

## 2023-04-17 MED ORDER — MAGNESIUM HYDROXIDE 400 MG/5ML PO SUSP: 30.0000 mL | Freq: Every day | ORAL | Status: DC | PRN

## 2023-04-17 MED ORDER — GADOBUTROL 1 MMOL/ML IV SOLN
8.0000 mL | Freq: Once | INTRAVENOUS | Status: AC | PRN
  Administered 2023-04-17: 8 mL via INTRAVENOUS

## 2023-04-17 MED ORDER — TRAZODONE HCL 50 MG PO TABS: 25.0000 mg | ORAL_TABLET | Freq: Every evening | ORAL | Status: DC | PRN

## 2023-04-17 MED ORDER — LISINOPRIL 20 MG PO TABS
40.0000 mg | ORAL_TABLET | Freq: Every day | ORAL | Status: DC
  Administered 2023-04-18: 40 mg via ORAL
  Filled 2023-04-17: qty 2

## 2023-04-17 MED ORDER — ONDANSETRON HCL 4 MG PO TABS: 4.0000 mg | ORAL_TABLET | Freq: Four times a day (QID) | ORAL | Status: DC | PRN

## 2023-04-17 MED ORDER — ACETAMINOPHEN 325 MG PO TABS: 650.0000 mg | ORAL_TABLET | Freq: Four times a day (QID) | ORAL | Status: DC | PRN

## 2023-04-17 MED ORDER — INSULIN ASPART 100 UNIT/ML IJ SOLN
0.0000 [IU] | Freq: Three times a day (TID) | INTRAMUSCULAR | Status: DC
  Administered 2023-04-18: 2 [IU] via SUBCUTANEOUS

## 2023-04-17 MED ORDER — PSYLLIUM 95 % PO PACK
1.0000 | PACK | Freq: Every day | ORAL | Status: DC
  Administered 2023-04-18: 1 via ORAL
  Filled 2023-04-17: qty 1

## 2023-04-17 MED ORDER — INSULIN ASPART 100 UNIT/ML IJ SOLN: 0.0000 [IU] | Freq: Every day | INTRAMUSCULAR | Status: DC

## 2023-04-17 MED ORDER — ONDANSETRON HCL 4 MG/2ML IJ SOLN: 4.0000 mg | Freq: Four times a day (QID) | INTRAMUSCULAR | Status: DC | PRN

## 2023-04-17 MED ORDER — SUCRALFATE 1 G PO TABS
1.0000 g | ORAL_TABLET | Freq: Three times a day (TID) | ORAL | Status: DC
  Administered 2023-04-18 (×2): 1 g via ORAL
  Filled 2023-04-17 (×3): qty 1

## 2023-04-17 MED ORDER — SODIUM CHLORIDE 0.9 % IV SOLN: INTRAVENOUS | Status: DC

## 2023-04-17 NOTE — ED Provider Notes (Signed)
[place] [place] Provider Note   CSN: [id number] Arrival date & time: 04/17/23  1242     History  Chief Complaint  Patient presents with   Loss of Consciousness    [name] is a 72 y.o. male.  HPI   72 year old male with past medical history of HTN, HLD, DM presents emergency department after an episode 2 days ago on Saturday.  Patient states for the past couple months he has been having brief episodes that he describes as dizzy spells.  He states that he briefly feels dizzy, usually is to go lie down, and that taking a 5-minute nap and wake up back to normal.  However on Saturday while he was in about saline he had a hard time using his right hand putting lettuce on a hamburger.  And after that when they started to dance the wife noticed him dragging his right foot and following this he developed a blank stare and states that he felt dizzy.  The wife states that he slowly lowered himself down to the ground and his eyes remained open but he was "not responding" for about 5 minutes.  Once this resolved he was back to baseline.  Never had any facial droop, vision changes or change in speech.  Since that episode on Saturday he has been baseline however the wife states that he seems to be getting more confused at times.  Denies any headache, neck pain, ongoing chest pain, recent infection.  Home Medications Prior to Admission medications   Medication Sig Start Date End Date Taking? Authorizing Provider  albuterol (PROVENTIL HFA;VENTOLIN HFA) 108 (90 BASE) MCG/ACT inhaler Inhale 2 puffs into the lungs every 6 (six) hours as needed for wheezing.     [provider]  allopurinol (ZYLOPRIM) 100 MG tablet Take 100 mg by mouth daily.    [provider]  allopurinol (ZYLOPRIM) 300 MG tablet Take 300 mg by mouth daily.    [provider]  Ascorbic Acid (VITAMIN C) 1000 MG tablet Take 1,000 mg by mouth daily.    [provider]  CINNAMON PO Take 1,000 mg by mouth daily.    [provider]  diclofenac Sodium (VOLTAREN) 1 % GEL Apply 2 g topically 4 (four) times daily. 12/30/19   Long, Arlyss Repress, MD  fenofibrate 160 MG tablet Take 160 mg by mouth at bedtime.     [provider]  lisinopril (PRINIVIL,ZESTRIL) 40 MG tablet Take 40 mg by mouth daily.  05/01/18   [provider]  methocarbamol (ROBAXIN) 500 MG tablet Take 1 tablet (500 mg total) by mouth every 8 (eight) hours as needed for muscle spasms. 12/30/19   Long, Arlyss Repress, MD  Multiple Vitamin (MULTIVITAMIN WITH MINERALS) TABS tablet Take 1 tablet by mouth daily.    [provider]  omeprazole (PRILOSEC) 20 MG capsule Take 1 capsule (20 mg total) by mouth 2 (two) times daily at 8 am and 10 pm for 14 days. 12/24/22 01/07/23  Edwin Dada P, DO  oxyCODONE (OXY IR/ROXICODONE) 5 MG immediate release tablet Take 1-2 tablets (5-10 mg total) by mouth every 4 (four) hours as needed for moderate pain. 04/15/19   Darnell Level, MD  pantoprazole (PROTONIX) 20 MG tablet Take 1 tablet (20 mg total) by mouth daily. 11/05/21   Gailen Shelter, PA  polyvinyl alcohol (ARTIFICIAL TEARS) 1.4 % ophthalmic solution Place 1 drop into both eyes 2 (two) times a day.  [provider]  psyllium (METAMUCIL) 58.6 % packet Take 1 packet by mouth at bedtime.     [provider]  rosuvastatin (CRESTOR) 40 MG tablet Take 20 mg by mouth at bedtime.     [provider]  sucralfate (CARAFATE) 1 g tablet Take 1 tablet (1 g total) by mouth 4 (four) times daily -  with meals and at bedtime. 11/05/21   Gailen Shelter, PA      Allergies    Hibiclens [chlorhexidine gluconate], Ciprofloxacin, Flagyl [metronidazole], and Nabumetone    Review of Systems   Review of Systems  Constitutional:  Positive for fatigue. Negative for fever.  Respiratory:  Negative for shortness of breath.   Cardiovascular:  Positive for chest pain.  Gastrointestinal:   Negative for abdominal pain, diarrhea and vomiting.  Musculoskeletal:  Negative for back pain, neck pain and neck stiffness.  Skin:  Negative for rash.  Neurological:  Positive for dizziness, tremors, weakness and light-headedness. Negative for seizures, facial asymmetry, speech difficulty, numbness and headaches.    Physical Exam Updated Vital Signs BP 134/71 (BP Location: Left Arm)   Pulse 71   Temp 98 F (36.7 C)   Resp 20   Ht 5\' 5"  (1.651 m)   Wt 81.6 kg   SpO2 98%   BMI 29.95 kg/m  Physical Exam Vitals and nursing note reviewed.  Constitutional:      General: He is not in acute distress.    Appearance: Normal appearance.  HENT:     Head: Normocephalic.     Mouth/Throat:     Mouth: Mucous membranes are moist.  Cardiovascular:     Rate and Rhythm: Normal rate.  Pulmonary:     Effort: Pulmonary effort is normal. No respiratory distress.  Abdominal:     Palpations: Abdomen is soft.     Tenderness: There is no abdominal tenderness.  Skin:    General: Skin is warm.  Neurological:     Mental Status: He is alert and oriented to person, place, and time. Mental status is at baseline.     Cranial Nerves: No cranial nerve deficit.     Sensory: No sensory deficit.     Motor: No weakness.  Psychiatric:        Mood and Affect: Mood normal.     ED Results / Procedures / Treatments   Labs (all labs ordered are listed, but only abnormal results are displayed) Labs Reviewed  BASIC METABOLIC PANEL - Abnormal; Notable for the following components:      Result Value   CO2 21 (*)    Glucose, Bld 178 (*)    BUN 27 (*)    Calcium 8.8 (*)    All other components within normal limits  CBC - Abnormal; Notable for the following components:   RBC 3.90 (*)    Hemoglobin 12.4 (*)    HCT 35.7 (*)    All other components within normal limits  URINALYSIS, ROUTINE W REFLEX MICROSCOPIC  TROPONIN I (HIGH SENSITIVITY)    EKG EKG Interpretation  Date/Time:  Tuesday Apr 17 2023  12:55:29 EDT Ventricular Rate:  74 PR Interval:  168 QRS Duration: 157 QT Interval:  433 QTC Calculation: 481 R Axis:   57 Text Interpretation: Sinus rhythm IVCD, consider atypical LBBB Similar to previous Confirmed by Coralee Pesa [id number]) on 04/17/2023 1:43:50 PM  Radiology No results found.  Procedures Procedures    Medications Ordered in ED Medications  iohexol (OMNIPAQUE) 350 MG/ML injection 75 mL (75  mLs Intravenous Contrast Given 04/17/23 1507)    ED Course/ Medical Decision Making/ A&P                             Medical Decision Making Amount and/or Complexity of Data Reviewed Labs: ordered. Radiology: ordered.  Risk Prescription drug management.   72 year old male presents emergency department after an episode of right-sided weakness, dizziness and a period of unresponsiveness.  Currently he is baseline and neuro intact.  Vital signs are stable.  Patient has been having repetitive dizzy spells.  Combined with the right-sided weakness and recent episode concern for possible TIA like pathology.  Blood work is reassuring without acute abnormality.  Plan for CT of the head as well as CT angio head and neck.  Patient signed out pending CT imaging.  EKG shows left bundle branch block which is baseline for the patient, initial troponin is negative, no active chest pain.        Final Clinical Impression(s) / ED Diagnoses Final diagnoses:  None    Rx / DC Orders ED Discharge Orders     None         Rozelle Logan, DO 04/17/23 1521

## 2023-04-17 NOTE — ED Notes (Signed)
[facility] called for transport to [facility] @6 :01pm.  Spoke with [place]

## 2023-04-17 NOTE — ED Triage Notes (Signed)
Patient presents to ED via POV from urgent care due to an abnormal EKG. However, urgent care did not give patient EKG to bring with him to ED. Denies chest pain. Reports syncopal episode on Saturday. Denies dizziness at this time.

## 2023-04-17 NOTE — ED Notes (Signed)
Attempted reports, receiving RN unavailable at this time.

## 2023-04-17 NOTE — ED Provider Notes (Signed)
Patient care was taken over from Dr. Wilkie Aye.  He had presented with some dizzy episodes and had a worse episode on Saturday where he had some right-sided weakness and then was unresponsive for about 5 minutes.  No current neurologic deficits.  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  I spoke with Dr. Wilford Corner who recommends admission to the hospitalist service for MRI/EEG.  They can let neurology know when he arrives at [facility].  I spoke with Dr. Rito Ehrlich who will admit the patient.   Rolan Bucco, MD 04/17/23 [phone]

## 2023-04-17 NOTE — ED Notes (Signed)
Family at bedside. 

## 2023-04-17 NOTE — H&P (Signed)
[place]   PATIENT NAME: [name]    MR#:  [id number]  DATE OF BIRTH:  [birth year]  DATE OF ADMISSION:  04/17/2023  PRIMARY CARE PHYSICIAN: Andreas Blower., MD   Patient is coming from: Home  REQUESTING/REFERRING PHYSICIAN: Coralee Pesa, MD  CHIEF COMPLAINT:   Chief Complaint  Patient presents with   Loss of Consciousness    HISTORY OF PRESENT ILLNESS:  [name] is a 72 y.o. male with medical history significant for diabetes mellitus, diverticular disease, hypertension, dyslipidemia, who presented to the emergency room with concern of dizziness and syncope.  He stated he was noted to have bilateral hands shaking prior to his syncope a couple of days ago on Saturday.  He stated that during the past couple months he has been having dizzy spells.  He denies any paresthesias or focal muscle weakness.  He believes he had a hard time using his right hand putting lettuce on a hamburge on Saturday and after that when he started to dance, his wife noticed him dragging his right foot and following this he developed a blank stare and states that he felt dizzy. The wife states that he slowly lowered himself down to the ground and his eyes remained open but he was "not responding" for about 5 minutes.   No chest pain or palpitations.  No cough or wheezing or hemoptysis.  No leg pain or edema or recent travels or surgeries.  ED Course: When he came to the ER, BP was 153/66 with otherwise normal vital signs.  Labs revealed blood glucose of 178 and a BUN 27.  High sensitive troponin was 7.  CBC showed mild anemia close to previous levels.  UA came back negative. EKG as reviewed by me :  EKG showed normal sinus rhythm with a rate of 74 with left bundle branch block. Imaging: Brain MRI with and without contrast revealed no acute intracranial normalities.    Head and neck CTA revealed the following: 1. No large vessel occlusion. 2. Moderate right intradural vertebral artery  stenosis  Contact was made with Dr. Wilford Corner who recommended admission to [facility].  The patient will be admitted to a medical telemetry observation bed for further evaluation and management. PAST MEDICAL HISTORY:   Past Medical History:  Diagnosis Date   Asthma    Back pain    Diabetes mellitus    diet controlled    Diverticular disease    Environmental allergies    History of diverticulitis of colon 05/17/2018   w/ perforation  s/p  sigmoid colectomy   History of kidney stones    Hyperlipemia    Hypertension     PAST SURGICAL HISTORY:   Past Surgical History:  Procedure Laterality Date   ACHILLES TENDON REPAIR Left 1974   ruptured   bilateral cataract surgery      COLON RESECTION N/A 05/17/2018   Procedure: EXPLORATORY LAPAROTOMY SIGMOID COLECTOMY AND  COLOSTOMY;  Surgeon: Darnell Level, MD;  Location: [facility];  Service: General;  Laterality: N/A;   COLONOSCOPY     COLOSTOMY     COLOSTOMY TAKEDOWN N/A 10/25/2018   Procedure: LAPAROSCOPIC COLOSTOMY REVERSAL;  Surgeon: [name], MD;  Location: [facility];  Service: General;  Laterality: N/A;   HERNIA REPAIR     INCISIONAL HERNIA REPAIR N/A 04/14/2019   Procedure: OPEN  REPAIR INCISIONAL HERNIA WITH MESH Patch;  Surgeon: Darnell Level, MD;  Location: [facility];  Service: General;  Laterality: N/A;   KIDNEY  STONE SURGERY     KNEE ARTHROSCOPY Right    SHOULDER ARTHROSCOPY W/ ROTATOR CUFF REPAIR Right 12-24-2010    dr sypher @[facility]    SHOULDER ARTHROSCOPY W/ SUBACROMIAL DECOMPRESSION AND DISTAL CLAVICLE EXCISION Left 03-28-2012   dr sypher  @[facility]    debridement   TRIGGER FINGER RELEASE  11/17/2011   Procedure: RELEASE TRIGGER FINGER/A-1 PULLEY;  Surgeon: Wyn Forster., MD;  Location: [facility];  Service: Orthopedics;  Laterality: Left;  RELEASE LEFT LONG TRIGGER FINGER   TUMOR REMOVAL     rt neck/chin    SOCIAL HISTORY:   Social History   Tobacco Use   Smoking status: Former   Smokeless tobacco: Never    Tobacco comments:    never really was a smoker many years ago   Substance Use Topics   Alcohol use: Never    FAMILY HISTORY:  History reviewed. No pertinent family history.  DRUG ALLERGIES:   Allergies  Allergen Reactions   Hibiclens [Chlorhexidine Gluconate] Rash and Other (See Comments)    Pt. Had rash on torso after last surgery, mostly on surgical side.  Am listing CHG as potential allergy. Preop 04/14/19 tolerated CHG wipe without problem.   Ciprofloxacin Rash    Took both cipro and metronidazole together, unsure which caused the rash   Flagyl [Metronidazole] Rash    Took both cipro and metronidazole together, unsure which caused the rash   Nabumetone Rash    REVIEW OF SYSTEMS:   ROS As per history of present illness. All pertinent systems were reviewed above. Constitutional, HEENT, cardiovascular, respiratory, GI, GU, musculoskeletal, neuro, psychiatric, endocrine, integumentary and hematologic systems were reviewed and are otherwise negative/unremarkable except for positive findings mentioned above in the HPI.   MEDICATIONS AT HOME:   Prior to Admission medications   Medication Sig Start Date End Date Taking? Authorizing Provider  albuterol (PROVENTIL HFA;VENTOLIN HFA) 108 (90 BASE) MCG/ACT inhaler Inhale 2 puffs into the lungs every 6 (six) hours as needed for wheezing.     [provider]  allopurinol (ZYLOPRIM) 100 MG tablet Take 100 mg by mouth daily.    [provider]  allopurinol (ZYLOPRIM) 300 MG tablet Take 300 mg by mouth daily.    [provider]  Ascorbic Acid (VITAMIN C) 1000 MG tablet Take 1,000 mg by mouth daily.    [provider]  CINNAMON PO Take 1,000 mg by mouth daily.    [provider]  diclofenac Sodium (VOLTAREN) 1 % GEL Apply 2 g topically 4 (four) times daily. 12/30/19   Long, Arlyss Repress, MD  fenofibrate 160 MG tablet Take 160 mg by mouth at bedtime.     [provider]  lisinopril  (PRINIVIL,ZESTRIL) 40 MG tablet Take 40 mg by mouth daily.  05/01/18   [provider]  methocarbamol (ROBAXIN) 500 MG tablet Take 1 tablet (500 mg total) by mouth every 8 (eight) hours as needed for muscle spasms. 12/30/19   Long, Arlyss Repress, MD  Multiple Vitamin (MULTIVITAMIN WITH MINERALS) TABS tablet Take 1 tablet by mouth daily.    [provider]  omeprazole (PRILOSEC) 20 MG capsule Take 1 capsule (20 mg total) by mouth 2 (two) times daily at 8 am and 10 pm for 14 days. 12/24/22 01/07/23  Edwin Dada P, DO  oxyCODONE (OXY IR/ROXICODONE) 5 MG immediate release tablet Take 1-2 tablets (5-10 mg total) by mouth every 4 (four) hours as needed for moderate pain. 04/15/19   Darnell Level, MD  pantoprazole (  PROTONIX) 20 MG tablet Take 1 tablet (20 mg total) by mouth daily. 11/05/21   Gailen Shelter, PA  polyvinyl alcohol (ARTIFICIAL TEARS) 1.4 % ophthalmic solution Place 1 drop into both eyes 2 (two) times a day.     [provider]  psyllium (METAMUCIL) 58.6 % packet Take 1 packet by mouth at bedtime.     [provider]  rosuvastatin (CRESTOR) 40 MG tablet Take 20 mg by mouth at bedtime.     [provider]  sucralfate (CARAFATE) 1 g tablet Take 1 tablet (1 g total) by mouth 4 (four) times daily -  with meals and at bedtime. 11/05/21   Gailen Shelter, PA      VITAL SIGNS:  Blood pressure 122/64, pulse 60, temperature 98.7 F (37.1 C), temperature source Oral, resp. rate 10, height 5\' 5"  (1.651 m), weight 82.6 kg, SpO2 97 %.  PHYSICAL EXAMINATION:  Physical Exam  GENERAL:  72 y.o.-year-old male patient lying in the bed with no acute distress.  EYES: Pupils equal, round, reactive to light and accommodation. No scleral icterus. Extraocular muscles intact.  HEENT: Head atraumatic, normocephalic. Oropharynx and nasopharynx clear.  NECK:  Supple, no jugular venous distention. No thyroid enlargement, no tenderness.  LUNGS: Normal breath sounds bilaterally, no  wheezing, rales,rhonchi or crepitation. No use of accessory muscles of respiration.  CARDIOVASCULAR: Regular rate and rhythm, S1, S2 normal. No murmurs, rubs, or gallops.  ABDOMEN: Soft, nondistended, nontender. Bowel sounds present. No organomegaly or mass.  EXTREMITIES: No pedal edema, cyanosis, or clubbing.  NEUROLOGIC: Cranial nerves II through XII are intact. Muscle strength 5/5 in all extremities. Sensation intact. Gait not checked.  PSYCHIATRIC: The patient is alert and oriented x 3.  Normal affect and good eye contact. SKIN: No obvious rash, lesion, or ulcer.   LABORATORY PANEL:   CBC Recent Labs  Lab 04/17/23 1335  WBC 5.8  HGB 12.4*  HCT 35.7*  PLT 274   ------------------------------------------------------------------------------------------------------------------  Chemistries  Recent Labs  Lab 04/17/23 1335  NA 135  K 4.0  CL 104  CO2 21*  GLUCOSE 178*  BUN 27*  CREATININE 1.24  CALCIUM 8.8*   ------------------------------------------------------------------------------------------------------------------  Cardiac Enzymes No results for input(s): "TROPONINI" in the last 168 hours. ------------------------------------------------------------------------------------------------------------------  RADIOLOGY:  MR BRAIN W WO CONTRAST  Result Date: 04/17/2023 CLINICAL DATA:  Follow-up examination for neuro deficit, stroke/TIA. EXAM: MRI HEAD WITHOUT AND WITH CONTRAST TECHNIQUE: Multiplanar, multiecho pulse sequences of the brain and surrounding structures were obtained without and with intravenous contrast. CONTRAST:  8mL GADAVIST GADOBUTROL 1 MMOL/ML IV SOLN COMPARISON:  Prior study from earlier the same day. FINDINGS: Brain: Cerebral volume within normal limits. Few scattered patchy subcentimeter foci of T2/FLAIR hyperintensity noted involving the supratentorial cerebral white matter, nonspecific, but overall minimal in nature, and less than is typically seen for  patient age. No evidence for acute or subacute ischemia. Gray-white matter differentiation maintained. Ors or chronic cortical infarction. No acute or chronic intracranial blood products. No mass lesion, midline shift or mass effect. No hydrocephalus or extra-axial fluid collection. Pituitary gland and suprasellar region within normal limits. No abnormal enhancement. Vascular: Major intracranial vascular flow voids are maintained. Skull and upper cervical spine: Craniocervical junction within normal limits. Bone marrow signal intensity normal. No scalp soft tissue abnormality. Sinuses/Orbits: Prior bilateral ocular lens replacement. Globes and orbital soft tissues demonstrate no acute finding. Few small retention cysts noted about the maxillary sinuses. Mild mucosal thickening about the ethmoidal air cells and maxillary  sinuses noted as well. No significant mastoid effusion. Other: None. IMPRESSION: Normal brain MRI for age. No acute intracranial abnormality identified. Electronically Signed   By: Rise Mu M.D.   On: 04/17/2023 23:04   CT Angio Head Neck W WO CM  Result Date: 04/17/2023 CLINICAL DATA:  Neuro deficit, acute, stroke suspected EXAM: CT ANGIOGRAPHY HEAD AND NECK WITH AND WITHOUT CONTRAST TECHNIQUE: Multidetector CT imaging of the head and neck was performed using the standard protocol during bolus administration of intravenous contrast. Multiplanar CT image reconstructions and MIPs were obtained to evaluate the vascular anatomy. Carotid stenosis measurements (when applicable) are obtained utilizing NASCET criteria, using the distal internal carotid diameter as the denominator. RADIATION DOSE REDUCTION: This exam was performed according to the departmental dose-optimization program which includes automated exposure control, adjustment of the mA and/or kV according to patient size and/or use of iterative reconstruction technique. CONTRAST:  75mL OMNIPAQUE IOHEXOL 350 MG/ML SOLN COMPARISON:   None Available. FINDINGS: CT HEAD FINDINGS Brain: No evidence of acute infarction, hemorrhage, hydrocephalus, extra-axial collection or mass lesion/mass effect. Vascular: See below. Skull: No acute fracture. Sinuses/Orbits: Largely clear sinuses.  No acute orbital findings. Other: No mastoid effusions. Review of the MIP images confirms the above findings CTA NECK FINDINGS Aortic arch: Great vessel origins patent without significant stenosis. Right carotid system: Atherosclerosis at the carotid bifurcation without greater than 50% stenosis. Left carotid system: Atherosclerosis at the carotid bifurcation without greater than 50% stenosis. Vertebral arteries: Right dominant. No evidence of greater than 50% stenosis. Mild multifocal narrowing bilaterally. Skeleton: No acute abnormality on limited assessment. Other neck: No acute abnormality on limited assessment. Upper chest: Visualized lung apices are clear. Review of the MIP images confirms the above findings CTA HEAD FINDINGS Anterior circulation: Bilateral intracranial ICAs, MCAs, and ACAs are patent without proximal hemodynamically significant stenosis. Posterior circulation: Bilateral posterior cerebral arteries are patent with moderate narrowing of the proximal right intradural vertebral artery. The left intradural vertebral artery is non dominant/small. Basilar artery and bilateral posterior cerebral arteries are patent without proximal hemodynamically significant stenosis. Venous sinuses: As permitted by contrast timing, patent. Anatomic variants: Detailed above. Review of the MIP images confirms the above findings IMPRESSION: 1. No large vessel occlusion. 2. Moderate right intradural vertebral artery stenosis. Electronically Signed   By: Feliberto Harts M.D.   On: 04/17/2023 15:36      IMPRESSION AND PLAN:  Assessment and Plan: * TIA (transient ischemic attack) - The patient will be admitted to an observation medically monitored bed.   - We will  follow neuro checks q.4 hours for 24 hours.   - The patient will be placed on Plavix. - Will obtain a brain MRI without contrast as well as  2D echo with bubble study .   - A neurology consultation  as well as physical/occupation/speech therapy consults will be obtained in a.m.Marland Kitchen   - The patient will be placed on statin therapy and fasting lipids will be checked. - We will obtain an EEG per neurology recommendation.   Dyslipidemia - We will continue statin therapy.  Essential hypertension - We will continue antihypertensives with permissive parameters.  Type 2 diabetes mellitus without complications (HCC) - He will be placed on supplemental coverage with NovoLog. - We will hold off metformin.  Gout - We will continue allopurinol.   DVT prophylaxis: Lovenox.  Advanced Care Planning:  Code Status: full code.  Family Communication:  The plan of care was discussed in details with the patient (and family).  I answered all questions. The patient agreed to proceed with the above mentioned plan. Further management will depend upon hospital course. Disposition Plan: Back to previous home environment Consults called: Neurology All the records are reviewed and case discussed with ED provider.  Status is: Observation   I certify that at the time of admission, it is my clinical judgment that the patient will require hospital care extending less than 2 midnights.                            Dispo: The patient is from: Home              Anticipated d/c is to: Home              Patient currently is not medically stable to d/c.              Difficult to place patient: No  Hannah Beat M.D on 04/18/2023 at 4:16 AM  [facility]   From 7 PM-7 AM, contact night-coverage [email or web address]  CC: Primary care physician; Andreas Blower., MD

## 2023-04-18 ENCOUNTER — Observation Stay (HOSPITAL_BASED_OUTPATIENT_CLINIC_OR_DEPARTMENT_OTHER): Payer: Medicare Other

## 2023-04-18 ENCOUNTER — Observation Stay (HOSPITAL_COMMUNITY): Payer: Medicare Other

## 2023-04-18 ENCOUNTER — Other Ambulatory Visit: Payer: Self-pay | Admitting: Neurology

## 2023-04-18 DIAGNOSIS — E785 Hyperlipidemia, unspecified: Secondary | ICD-10-CM

## 2023-04-18 DIAGNOSIS — E119 Type 2 diabetes mellitus without complications: Secondary | ICD-10-CM | POA: Diagnosis not present

## 2023-04-18 DIAGNOSIS — G459 Transient cerebral ischemic attack, unspecified: Secondary | ICD-10-CM

## 2023-04-18 DIAGNOSIS — I10 Essential (primary) hypertension: Secondary | ICD-10-CM | POA: Diagnosis not present

## 2023-04-18 DIAGNOSIS — I361 Nonrheumatic tricuspid (valve) insufficiency: Secondary | ICD-10-CM | POA: Diagnosis not present

## 2023-04-18 DIAGNOSIS — R569 Unspecified convulsions: Secondary | ICD-10-CM | POA: Diagnosis not present

## 2023-04-18 DIAGNOSIS — M109 Gout, unspecified: Secondary | ICD-10-CM | POA: Insufficient documentation

## 2023-04-18 LAB — CBC
HCT: 37.2 % — ABNORMAL LOW (ref 39.0–52.0)
Hemoglobin: 12.9 g/dL — ABNORMAL LOW (ref 13.0–17.0)
MCH: 31.9 pg (ref 26.0–34.0)
MCHC: 34.7 g/dL (ref 30.0–36.0)
MCV: 92.1 fL (ref 80.0–100.0)
Platelets: 289 10*3/uL (ref 150–400)
RBC: 4.04 MIL/uL — ABNORMAL LOW (ref 4.22–5.81)
RDW: 13 % (ref 11.5–15.5)
WBC: 6 10*3/uL (ref 4.0–10.5)
nRBC: 0 % (ref 0.0–0.2)

## 2023-04-18 LAB — HIV ANTIBODY (ROUTINE TESTING W REFLEX): HIV Screen 4th Generation wRfx: NONREACTIVE

## 2023-04-18 LAB — LIPID PANEL
Cholesterol: 126 mg/dL (ref 0–200)
HDL: 46 mg/dL (ref >40)
LDL Cholesterol: 60 mg/dL (ref 0–99)
Total CHOL/HDL Ratio: 2.7 RATIO
Triglycerides: 102 mg/dL (ref <150)
VLDL: 20 mg/dL (ref 0–40)

## 2023-04-18 LAB — BASIC METABOLIC PANEL
Anion gap: 9 (ref 5–15)
BUN: 22 mg/dL (ref 8–23)
CO2: 23 mmol/L (ref 22–32)
Calcium: 9.3 mg/dL (ref 8.9–10.3)
Chloride: 105 mmol/L (ref 98–111)
Creatinine, Ser: 1.02 mg/dL (ref 0.61–1.24)
GFR, Estimated: >60 mL/min (ref >60)
Glucose, Bld: 126 mg/dL — ABNORMAL HIGH (ref 70–99)
Potassium: 4.1 mmol/L (ref 3.5–5.1)
Sodium: 137 mmol/L (ref 135–145)

## 2023-04-18 LAB — ECHOCARDIOGRAM COMPLETE BUBBLE STUDY
Area-P 1/2: 3.13 cm2
MV VTI: 3.04 cm2
S' Lateral: 3.1 cm
Single Plane A4C EF: 52 %

## 2023-04-18 LAB — HEMOGLOBIN A1C
Hgb A1c MFr Bld: 6.4 % — ABNORMAL HIGH (ref 4.8–5.6)
Mean Plasma Glucose: 136.98 mg/dL

## 2023-04-18 LAB — GLUCOSE, CAPILLARY
Glucose-Capillary: 121 mg/dL — ABNORMAL HIGH (ref 70–99)
Glucose-Capillary: 151 mg/dL — ABNORMAL HIGH (ref 70–99)
Glucose-Capillary: 89 mg/dL (ref 70–99)

## 2023-04-18 MED ORDER — ASPIRIN 81 MG PO TBEC
81.0000 mg | DELAYED_RELEASE_TABLET | Freq: Every day | ORAL | Status: DC
  Administered 2023-04-18: 81 mg via ORAL
  Filled 2023-04-18: qty 1

## 2023-04-18 MED ORDER — PERFLUTREN LIPID MICROSPHERE
1.0000 mL | INTRAVENOUS | Status: AC | PRN
  Administered 2023-04-18: 2 mL via INTRAVENOUS

## 2023-04-18 MED ORDER — CLOPIDOGREL BISULFATE 75 MG PO TABS: 75.0000 mg | ORAL_TABLET | Freq: Every day | ORAL | 1 refills | Status: DC

## 2023-04-18 MED ORDER — LEVETIRACETAM 500 MG PO TABS
500.0000 mg | ORAL_TABLET | Freq: Two times a day (BID) | ORAL | Status: DC
  Administered 2023-04-18: 500 mg via ORAL
  Filled 2023-04-18: qty 1

## 2023-04-18 MED ORDER — IPRATROPIUM-ALBUTEROL 0.5-2.5 (3) MG/3ML IN SOLN: 3.0000 mL | Freq: Four times a day (QID) | RESPIRATORY_TRACT | Status: DC | PRN

## 2023-04-18 MED ORDER — CLOPIDOGREL BISULFATE 75 MG PO TABS
75.0000 mg | ORAL_TABLET | Freq: Every day | ORAL | Status: DC
  Administered 2023-04-18: 75 mg via ORAL
  Filled 2023-04-18: qty 1

## 2023-04-18 MED ORDER — LEVETIRACETAM 500 MG PO TABS: 500.0000 mg | ORAL_TABLET | Freq: Two times a day (BID) | ORAL | 1 refills | Status: DC

## 2023-04-18 NOTE — Progress Notes (Signed)
  Echocardiogram 2D Echocardiogram has been performed.  [name] 04/18/2023, 11:52 AM

## 2023-04-18 NOTE — Plan of Care (Signed)
  Problem: Education: Goal: Knowledge of General Education information will improve Description: Including pain rating scale, medication(s)/side effects and non-pharmacologic comfort measures Outcome: Progressing   Problem: Health Behavior/Discharge Planning: Goal: Ability to manage health-related needs will improve Outcome: Progressing   Problem: Clinical Measurements: Goal: Ability to maintain clinical measurements within normal limits will improve Outcome: Progressing Goal: Will remain free from infection Outcome: Progressing Goal: Diagnostic test results will improve Outcome: Progressing Goal: Respiratory complications will improve Outcome: Progressing Goal: Cardiovascular complication will be avoided Outcome: Progressing   Problem: Activity: Goal: Risk for activity intolerance will decrease Outcome: Progressing   Problem: Nutrition: Goal: Adequate nutrition will be maintained Outcome: Progressing   Problem: Coping: Goal: Level of anxiety will decrease Outcome: Progressing   Problem: Elimination: Goal: Will not experience complications related to bowel motility Outcome: Progressing Goal: Will not experience complications related to urinary retention Outcome: Progressing   Problem: Pain Managment: Goal: General experience of comfort will improve Outcome: Progressing   Problem: Safety: Goal: Ability to remain free from injury will improve Outcome: Progressing   Problem: Skin Integrity: Goal: Risk for impaired skin integrity will decrease Outcome: Progressing   Problem: Education: Goal: Knowledge of disease or condition will improve Outcome: Progressing Goal: Knowledge of secondary prevention will improve (MUST DOCUMENT ALL) Outcome: Progressing Goal: Knowledge of patient specific risk factors will improve (Mark N/A or DELETE if not current risk factor) Outcome: Progressing   Problem: Ischemic Stroke/TIA Tissue Perfusion: Goal: Complications of ischemic  stroke/TIA will be minimized Outcome: Progressing   Problem: Coping: Goal: Will verbalize positive feelings about self Outcome: Progressing Goal: Will identify appropriate support needs Outcome: Progressing   Problem: Health Behavior/Discharge Planning: Goal: Ability to manage health-related needs will improve Outcome: Progressing Goal: Goals will be collaboratively established with patient/family Outcome: Progressing   Problem: Self-Care: Goal: Ability to participate in self-care as condition permits will improve Outcome: Progressing Goal: Verbalization of feelings and concerns over difficulty with self-care will improve Outcome: Progressing Goal: Ability to communicate needs accurately will improve Outcome: Progressing   Problem: Nutrition: Goal: Risk of aspiration will decrease Outcome: Progressing Goal: Dietary intake will improve Outcome: Progressing   Problem: Education: Goal: Ability to describe self-care measures that may prevent or decrease complications (Diabetes Survival Skills Education) will improve Outcome: Progressing Goal: Individualized Educational Video(s) Outcome: Progressing   Problem: Coping: Goal: Ability to adjust to condition or change in health will improve Outcome: Progressing   Problem: Fluid Volume: Goal: Ability to maintain a balanced intake and output will improve Outcome: Progressing   Problem: Health Behavior/Discharge Planning: Goal: Ability to identify and utilize available resources and services will improve Outcome: Progressing Goal: Ability to manage health-related needs will improve Outcome: Progressing   Problem: Metabolic: Goal: Ability to maintain appropriate glucose levels will improve Outcome: Progressing   Problem: Nutritional: Goal: Maintenance of adequate nutrition will improve Outcome: Progressing Goal: Progress toward achieving an optimal weight will improve Outcome: Progressing   Problem: Skin  Integrity: Goal: Risk for impaired skin integrity will decrease Outcome: Progressing   Problem: Tissue Perfusion: Goal: Adequacy of tissue perfusion will improve Outcome: Progressing   

## 2023-04-18 NOTE — Consult Note (Addendum)
NEURO HOSPITALIST CONSULT NOTE   Requesting physician: Dr. Arville Care  Reason for Consult: Syncope versus seizure  History obtained from:  Patient and Chart     HPI:                                                                                                                                          [name] is an 72 y.o. male with a PMHx of DM, asthma, diverticulitis, HLD and HTN who presented to the ED on Tuesday after having had a syncopal spell on Saturday after which he was observed to be in an awake, unresponsive state for about 5 minutes before spontaneously recovering. He has no prior history of similar events.   HPI from EDP note has also been reviewed: "72 year old male with past medical history of HTN, HLD, DM presents emergency department after an episode 2 days ago on Saturday. Patient states for the past couple months he has been having brief episodes that he describes as dizzy spells. He states that he briefly feels dizzy, usually is to go lie down, and that taking a 5-minute nap and wake up back to normal. However on Saturday while he was in about saline he had a hard time using his right hand putting lettuce on a hamburger. And after that when they started to dance the wife noticed him dragging his right foot and following this he developed a blank stare and states that he felt dizzy. The wife states that he slowly lowered himself down to the ground and his eyes remained open but he was "not responding" for about 5 minutes. Once this resolved he was back to baseline. Never had any facial droop, vision changes or change in speech. Since that episode on Saturday he has been baseline however the wife states that he seems to be getting more confused at times. Denies any headache, neck pain, ongoing chest pain, recent infection."  Past Medical History:  Diagnosis Date   Asthma    Back pain    Diabetes mellitus    diet controlled    Diverticular disease     Environmental allergies    History of diverticulitis of colon 05/17/2018   w/ perforation  s/p  sigmoid colectomy   History of kidney stones    Hyperlipemia    Hypertension     Past Surgical History:  Procedure Laterality Date   ACHILLES TENDON REPAIR Left 1974   ruptured   bilateral cataract surgery      COLON RESECTION N/A 05/17/2018   Procedure: EXPLORATORY LAPAROTOMY SIGMOID COLECTOMY AND  COLOSTOMY;  Surgeon: Darnell Level, MD;  Location: [facility];  Service: General;  Laterality: N/A;   COLONOSCOPY     COLOSTOMY     COLOSTOMY TAKEDOWN N/A 10/25/2018   Procedure: LAPAROSCOPIC  COLOSTOMY REVERSAL;  Surgeon: [name], MD;  Location: [facility];  Service: General;  Laterality: N/A;   HERNIA REPAIR     INCISIONAL HERNIA REPAIR N/A 04/14/2019   Procedure: OPEN  REPAIR INCISIONAL HERNIA WITH MESH Patch;  Surgeon: Darnell Level, MD;  Location: [facility];  Service: General;  Laterality: N/A;   KIDNEY STONE SURGERY     KNEE ARTHROSCOPY Right    SHOULDER ARTHROSCOPY W/ ROTATOR CUFF REPAIR Right 12-24-2010    dr sypher @[facility]    SHOULDER ARTHROSCOPY W/ SUBACROMIAL DECOMPRESSION AND DISTAL CLAVICLE EXCISION Left 03-28-2012   dr sypher  @[facility]    debridement   TRIGGER FINGER RELEASE  11/17/2011   Procedure: RELEASE TRIGGER FINGER/A-1 PULLEY;  Surgeon: Wyn Forster., MD;  Location: [facility];  Service: Orthopedics;  Laterality: Left;  RELEASE LEFT LONG TRIGGER FINGER   TUMOR REMOVAL     rt neck/chin    History reviewed. No pertinent family history.            Social History:  reports that he has quit smoking. He has never used smokeless tobacco. He reports that he does not drink alcohol and does not use drugs.  Allergies  Allergen Reactions   Hibiclens [Chlorhexidine Gluconate] Rash and Other (See Comments)    Pt. Had rash on torso after last surgery, mostly on surgical side.  Am listing CHG as potential allergy. Preop 04/14/19 tolerated CHG wipe without problem.    Ciprofloxacin Rash    Took both cipro and metronidazole together, unsure which caused the rash   Flagyl [Metronidazole] Rash    Took both cipro and metronidazole together, unsure which caused the rash   Nabumetone Rash    MEDICATIONS:                                                                                                                     Prior to Admission:  Medications Prior to Admission  Medication Sig Dispense Refill Last Dose   albuterol (PROVENTIL HFA;VENTOLIN HFA) 108 (90 BASE) MCG/ACT inhaler Inhale 2 puffs into the lungs every 6 (six) hours as needed for wheezing.       allopurinol (ZYLOPRIM) 100 MG tablet Take 100 mg by mouth daily.      allopurinol (ZYLOPRIM) 300 MG tablet Take 300 mg by mouth daily.      Ascorbic Acid (VITAMIN C) 1000 MG tablet Take 1,000 mg by mouth daily.      CINNAMON PO Take 1,000 mg by mouth daily.      diclofenac Sodium (VOLTAREN) 1 % GEL Apply 2 g topically 4 (four) times daily. 50 g 0    fenofibrate 160 MG tablet Take 160 mg by mouth at bedtime.       lisinopril (PRINIVIL,ZESTRIL) 40 MG tablet Take 40 mg by mouth daily.   3    methocarbamol (ROBAXIN) 500 MG tablet Take 1 tablet (500 mg total) by mouth every 8 (eight) hours as needed for muscle spasms. 20 tablet 0  Multiple Vitamin (MULTIVITAMIN WITH MINERALS) TABS tablet Take 1 tablet by mouth daily.      omeprazole (PRILOSEC) 20 MG capsule Take 1 capsule (20 mg total) by mouth 2 (two) times daily at 8 am and 10 pm for 14 days. 28 capsule 0    oxyCODONE (OXY IR/ROXICODONE) 5 MG immediate release tablet Take 1-2 tablets (5-10 mg total) by mouth every 4 (four) hours as needed for moderate pain. 20 tablet 0    pantoprazole (PROTONIX) 20 MG tablet Take 1 tablet (20 mg total) by mouth daily. 21 tablet 0    polyvinyl alcohol (ARTIFICIAL TEARS) 1.4 % ophthalmic solution Place 1 drop into both eyes 2 (two) times a day.       psyllium (METAMUCIL) 58.6 % packet Take 1 packet by mouth at bedtime.        rosuvastatin (CRESTOR) 40 MG tablet Take 20 mg by mouth at bedtime.       sucralfate (CARAFATE) 1 g tablet Take 1 tablet (1 g total) by mouth 4 (four) times daily -  with meals and at bedtime. 21 tablet 0    Scheduled:   stroke: early stages of recovery book   Does not apply Once   allopurinol  100 mg Oral Daily   clopidogrel  75 mg Oral Daily   enoxaparin (LOVENOX) injection  40 mg Subcutaneous Q24H   fenofibrate  160 mg Oral QHS   insulin aspart  0-5 Units Subcutaneous QHS   insulin aspart  0-9 Units Subcutaneous TID WC   lisinopril  40 mg Oral Daily   pantoprazole  40 mg Oral Daily   psyllium  1 packet Oral Daily   rosuvastatin  20 mg Oral QHS   sucralfate  1 g Oral TID WC & HS   Continuous:  sodium chloride Stopped (04/18/23 0114)     ROS:                                                                                                                                       As per HPI.    Blood pressure 130/75, pulse 60, temperature 98.2 F (36.8 C), temperature source Oral, resp. rate 10, height 5\' 5"  (1.651 m), weight 82.6 kg, SpO2 97 %.   General Examination:                                                                                                       Physical Exam  HEENT-  [place]/AT  Lungs- Respirations unlabored Extremities- No edema   Neurological Examination Mental Status: Alert, oriented x 5, thought content appropriate.  Speech fluent without evidence of aphasia.  Able to follow all commands without difficulty. Cranial Nerves: II: Temporal visual fields intact with no extinction to DSS. PERRL  III,IV, VI: No ptosis. EOMI.  V: Temp sensation equal bilaterally  VII: Smile symmetric VIII: Hearing intact to voice IX,X: No hoarseness XI: Symmetric shoulder shrug XII: Midline tongue extension Motor: BUE 5/5 proximally and distally BLE 5/5 proximally and distally  No pronator drift.  Sensory: Temp and light touch intact throughout, bilaterally. No  extinction to DSS.  Deep Tendon Reflexes: 2+ and symmetric throughout Cerebellar: No ataxia with FNF bilaterally  Gait: Deferred   Lab Results: Basic Metabolic Panel: Recent Labs  Lab 04/17/23 1335  NA 135  K 4.0  CL 104  CO2 21*  GLUCOSE 178*  BUN 27*  CREATININE 1.24  CALCIUM 8.8*    CBC: Recent Labs  Lab 04/17/23 1335  WBC 5.8  HGB 12.4*  HCT 35.7*  MCV 91.5  PLT 274    Cardiac Enzymes: No results for input(s): "CKTOTAL", "CKMB", "CKMBINDEX", "TROPONINI" in the last 168 hours.  Lipid Panel: No results for input(s): "CHOL", "TRIG", "HDL", "CHOLHDL", "VLDL", "LDLCALC" in the last 168 hours.  Imaging: MR BRAIN W WO CONTRAST  Result Date: 04/17/2023 CLINICAL DATA:  Follow-up examination for neuro deficit, stroke/TIA. EXAM: MRI HEAD WITHOUT AND WITH CONTRAST TECHNIQUE: Multiplanar, multiecho pulse sequences of the brain and surrounding structures were obtained without and with intravenous contrast. CONTRAST:  8mL GADAVIST GADOBUTROL 1 MMOL/ML IV SOLN COMPARISON:  Prior study from earlier the same day. FINDINGS: Brain: Cerebral volume within normal limits. Few scattered patchy subcentimeter foci of T2/FLAIR hyperintensity noted involving the supratentorial cerebral white matter, nonspecific, but overall minimal in nature, and less than is typically seen for patient age. No evidence for acute or subacute ischemia. Gray-white matter differentiation maintained. Ors or chronic cortical infarction. No acute or chronic intracranial blood products. No mass lesion, midline shift or mass effect. No hydrocephalus or extra-axial fluid collection. Pituitary gland and suprasellar region within normal limits. No abnormal enhancement. Vascular: Major intracranial vascular flow voids are maintained. Skull and upper cervical spine: Craniocervical junction within normal limits. Bone marrow signal intensity normal. No scalp soft tissue abnormality. Sinuses/Orbits: Prior bilateral ocular lens  replacement. Globes and orbital soft tissues demonstrate no acute finding. Few small retention cysts noted about the maxillary sinuses. Mild mucosal thickening about the ethmoidal air cells and maxillary sinuses noted as well. No significant mastoid effusion. Other: None. IMPRESSION: Normal brain MRI for age. No acute intracranial abnormality identified. Electronically Signed   By: Rise Mu M.D.   On: 04/17/2023 23:04   CT Angio Head Neck W WO CM  Result Date: 04/17/2023 CLINICAL DATA:  Neuro deficit, acute, stroke suspected EXAM: CT ANGIOGRAPHY HEAD AND NECK WITH AND WITHOUT CONTRAST TECHNIQUE: Multidetector CT imaging of the head and neck was performed using the standard protocol during bolus administration of intravenous contrast. Multiplanar CT image reconstructions and MIPs were obtained to evaluate the vascular anatomy. Carotid stenosis measurements (when applicable) are obtained utilizing NASCET criteria, using the distal internal carotid diameter as the denominator. RADIATION DOSE REDUCTION: This exam was performed according to the departmental dose-optimization program which includes automated exposure control, adjustment of the mA and/or kV according to patient size and/or use of iterative reconstruction technique. CONTRAST:  75mL OMNIPAQUE IOHEXOL 350 MG/ML SOLN COMPARISON:  None Available. FINDINGS:  CT HEAD FINDINGS Brain: No evidence of acute infarction, hemorrhage, hydrocephalus, extra-axial collection or mass lesion/mass effect. Vascular: See below. Skull: No acute fracture. Sinuses/Orbits: Largely clear sinuses.  No acute orbital findings. Other: No mastoid effusions. Review of the MIP images confirms the above findings CTA NECK FINDINGS Aortic arch: Great vessel origins patent without significant stenosis. Right carotid system: Atherosclerosis at the carotid bifurcation without greater than 50% stenosis. Left carotid system: Atherosclerosis at the carotid bifurcation without greater  than 50% stenosis. Vertebral arteries: Right dominant. No evidence of greater than 50% stenosis. Mild multifocal narrowing bilaterally. Skeleton: No acute abnormality on limited assessment. Other neck: No acute abnormality on limited assessment. Upper chest: Visualized lung apices are clear. Review of the MIP images confirms the above findings CTA HEAD FINDINGS Anterior circulation: Bilateral intracranial ICAs, MCAs, and ACAs are patent without proximal hemodynamically significant stenosis. Posterior circulation: Bilateral posterior cerebral arteries are patent with moderate narrowing of the proximal right intradural vertebral artery. The left intradural vertebral artery is non dominant/small. Basilar artery and bilateral posterior cerebral arteries are patent without proximal hemodynamically significant stenosis. Venous sinuses: As permitted by contrast timing, patent. Anatomic variants: Detailed above. Review of the MIP images confirms the above findings IMPRESSION: 1. No large vessel occlusion. 2. Moderate right intradural vertebral artery stenosis. Electronically Signed   By: Feliberto Harts M.D.   On: 04/17/2023 15:36     Assessment: 72 year old male who presented after a syncopal spell after which he was in an awake, unresponsive state for approximately 5 minutes - Neurology exam is nonfocal. - MRI brain:  Normal brain MRI for age. No acute intracranial abnormality identified  - CTA of head and neck: No large vessel occlusion. Moderate right intradural vertebral artery stenosis.  - DDx for presentation includes atypical presentation of syncope, TIA, subclinical seizure and transient encephalopathy due to a toxic or metabolic etiology. Overall impression is that TIA is the most likely etiology for his presentation.   Recommendations: - EEG - TTE and cardiac telemetry - Has been started on Plavix - Frequent neuro checks - PT/OT/Speech - Work up for possible orthostatic or cardiogenic syncope     Electronically signed: Dr. Caryl Pina 04/18/2023, 7:50 AM

## 2023-04-18 NOTE — Progress Notes (Signed)
EEG complete - results pending 

## 2023-04-18 NOTE — Care Management Obs Status (Signed)
MEDICARE OBSERVATION STATUS NOTIFICATION   Patient Details  Name: [name] MRN: [id number] Date of Birth: February 07, 1951   Medicare Observation Status Notification Given:  Yes    Lawerance Sabal, RN 04/18/2023, 3:47 PM

## 2023-04-18 NOTE — Progress Notes (Signed)
PROGRESS NOTE        PATIENT DETAILS Name: [name] Age: 72 y.o. Sex: male Date of Birth: 19-Mar-1951 Admit Date: 04/17/2023 Admitting Physician Osvaldo Shipper, MD LKG:[id number], Diana Eves., MD  Brief Summary: Patient is a 72 y.o.  male HFrEF, HTN, HLD-who presented for evaluation of a syncopal episode.  Per patient report-he has been having spells of lightheadedness for the past several months.  Significant events: 5/14>> admit to [facility]  Significant studies: 5/14>> MRI brain: No acute CVA 5/14>> CT angio head/neck: No LVO/major stenosis-moderate right intradural vertebral artery stenosis. 5/15>> A1c: 6.4 5/15>> LDL: 60  Significant microbiology data: None  Procedures: None  Consults: Neurology Cardiology  Subjective: Lying comfortably in bed-denies any chest pain or shortness of breath.  Objective: Vitals: Blood pressure 130/75, pulse 60, temperature 98.2 F (36.8 C), temperature source Oral, resp. rate 10, height 5\' 5"  (1.651 m), weight 82.6 kg, SpO2 97 %.   Exam: Gen Exam:Alert awake-not in any distress HEENT:atraumatic, normocephalic Chest: B/L clear to auscultation anteriorly CVS:S1S2 regular-+ systolic murmur Abdomen:soft non tender, non distended Extremities:no edema Neurology: Non focal Skin: no rash  Pertinent Labs/Radiology:    Latest Ref Rng & Units 04/18/2023    7:37 AM 04/17/2023    1:35 PM 12/24/2022    8:13 AM  CBC  WBC 4.0 - 10.5 K/uL 6.0  5.8  5.4   Hemoglobin 13.0 - 17.0 g/dL 25.3  66.4  40.3   Hematocrit 39.0 - 52.0 % 37.2  35.7  37.3   Platelets 150 - 400 K/uL 289  274  305     Lab Results  Component Value Date   NA 137 04/18/2023   K 4.1 04/18/2023   CL 105 04/18/2023   CO2 23 04/18/2023      Assessment/Plan: Syncope Unclear if this was a TIA or a syncopal episode from orthostatic hypotension-?  Cardiogenic-reportedly has low EF.   EKG with LBBB pattern-unchanged from prior. Workup as above Telemetry  negative for major arrhythmias overnight Await echo/EEG Check orthostatic vital signs Will get cardiology opinion.  Suspect if inpatient workup is negative-will likely need outpatient heart monitor.  Chronic HFrEF Euvolemic Apparently follows with cardiology in [place] Await echo  HLD Statin/fenofibrate  HTN BP controlled Lisinopril  DM-2 CBG stable with SSI while inpatient Resume metformin on discharge.  Gout No flare Allopurinol  Obesity: Estimated body mass index is 30.29 kg/m as calculated from the following:   Height as of this encounter: 5\' 5"  (1.651 m).   Weight as of this encounter: 82.6 kg.   Code status:   Code Status: Full Code   DVT Prophylaxis: enoxaparin (LOVENOX) injection 40 mg Start: 04/18/23 1400   Family Communication: Family member sleeping at bedside when I was in the room.   Disposition Plan: Status is: Observation The patient remains OBS appropriate and will d/c before 2 midnights.   Planned Discharge Destination:Home   Diet: Diet Order             Diet Carb Modified Fluid consistency: Thin; Room service appropriate? Yes  Diet effective now                     Antimicrobial agents: Anti-infectives (From admission, onward)    None        MEDICATIONS: Scheduled Meds:   stroke: early stages of recovery book  Does not apply Once   allopurinol  100 mg Oral Daily   aspirin EC  81 mg Oral Daily   clopidogrel  75 mg Oral Daily   enoxaparin (LOVENOX) injection  40 mg Subcutaneous Q24H   fenofibrate  160 mg Oral QHS   insulin aspart  0-5 Units Subcutaneous QHS   insulin aspart  0-9 Units Subcutaneous TID WC   lisinopril  40 mg Oral Daily   pantoprazole  40 mg Oral Daily   psyllium  1 packet Oral Daily   rosuvastatin  20 mg Oral QHS   sucralfate  1 g Oral TID WC & HS   Continuous Infusions:  sodium chloride Stopped (04/18/23 0114)   PRN Meds:.acetaminophen **OR** acetaminophen, albuterol, ipratropium-albuterol,  magnesium hydroxide, methocarbamol, ondansetron **OR** ondansetron (ZOFRAN) IV, oxyCODONE, traZODone   I have personally reviewed following labs and imaging studies  LABORATORY DATA: CBC: Recent Labs  Lab 04/17/23 1335 04/18/23 0737  WBC 5.8 6.0  HGB 12.4* 12.9*  HCT 35.7* 37.2*  MCV 91.5 92.1  PLT 274 289    Basic Metabolic Panel: Recent Labs  Lab 04/17/23 1335 04/18/23 0737  NA 135 137  K 4.0 4.1  CL 104 105  CO2 21* 23  GLUCOSE 178* 126*  BUN 27* 22  CREATININE 1.24 1.02  CALCIUM 8.8* 9.3    GFR: Estimated Creatinine Clearance: 67.6 mL/min (by C-G formula based on SCr of 1.02 mg/dL).  Liver Function Tests: No results for input(s): "AST", "ALT", "ALKPHOS", "BILITOT", "PROT", "ALBUMIN" in the last 168 hours. No results for input(s): "LIPASE", "AMYLASE" in the last 168 hours. No results for input(s): "AMMONIA" in the last 168 hours.  Coagulation Profile: No results for input(s): "INR", "PROTIME" in the last 168 hours.  Cardiac Enzymes: No results for input(s): "CKTOTAL", "CKMB", "CKMBINDEX", "TROPONINI" in the last 168 hours.  BNP (last 3 results) No results for input(s): "PROBNP" in the last 8760 hours.  Lipid Profile: Recent Labs    04/18/23 0737  CHOL 126  HDL 46  LDLCALC 60  TRIG 102  CHOLHDL 2.7    Thyroid Function Tests: No results for input(s): "TSH", "T4TOTAL", "FREET4", "T3FREE", "THYROIDAB" in the last 72 hours.  Anemia Panel: No results for input(s): "VITAMINB12", "FOLATE", "FERRITIN", "TIBC", "IRON", "RETICCTPCT" in the last 72 hours.  Urine analysis:    Component Value Date/Time   COLORURINE YELLOW 04/17/2023 1403   APPEARANCEUR CLEAR 04/17/2023 1403   LABSPEC 1.025 04/17/2023 1403   PHURINE 5.5 04/17/2023 1403   GLUCOSEU NEGATIVE 04/17/2023 1403   HGBUR NEGATIVE 04/17/2023 1403   BILIRUBINUR NEGATIVE 04/17/2023 1403   KETONESUR NEGATIVE 04/17/2023 1403   PROTEINUR NEGATIVE 04/17/2023 1403   NITRITE NEGATIVE 04/17/2023 1403    LEUKOCYTESUR NEGATIVE 04/17/2023 1403    Sepsis Labs: Lactic Acid, Venous    Component Value Date/Time   LATICACIDVEN 0.76 05/17/2018 0456    MICROBIOLOGY: No results found for this or any previous visit (from the past 240 hour(s)).  RADIOLOGY STUDIES/RESULTS: MR BRAIN W WO CONTRAST  Result Date: 04/17/2023 CLINICAL DATA:  Follow-up examination for neuro deficit, stroke/TIA. EXAM: MRI HEAD WITHOUT AND WITH CONTRAST TECHNIQUE: Multiplanar, multiecho pulse sequences of the brain and surrounding structures were obtained without and with intravenous contrast. CONTRAST:  8mL GADAVIST GADOBUTROL 1 MMOL/ML IV SOLN COMPARISON:  Prior study from earlier the same day. FINDINGS: Brain: Cerebral volume within normal limits. Few scattered patchy subcentimeter foci of T2/FLAIR hyperintensity noted involving the supratentorial cerebral white matter, nonspecific, but overall minimal in nature, and  less than is typically seen for patient age. No evidence for acute or subacute ischemia. Gray-white matter differentiation maintained. Ors or chronic cortical infarction. No acute or chronic intracranial blood products. No mass lesion, midline shift or mass effect. No hydrocephalus or extra-axial fluid collection. Pituitary gland and suprasellar region within normal limits. No abnormal enhancement. Vascular: Major intracranial vascular flow voids are maintained. Skull and upper cervical spine: Craniocervical junction within normal limits. Bone marrow signal intensity normal. No scalp soft tissue abnormality. Sinuses/Orbits: Prior bilateral ocular lens replacement. Globes and orbital soft tissues demonstrate no acute finding. Few small retention cysts noted about the maxillary sinuses. Mild mucosal thickening about the ethmoidal air cells and maxillary sinuses noted as well. No significant mastoid effusion. Other: None. IMPRESSION: Normal brain MRI for age. No acute intracranial abnormality identified. Electronically  Signed   By: Rise Mu M.D.   On: 04/17/2023 23:04   CT Angio Head Neck W WO CM  Result Date: 04/17/2023 CLINICAL DATA:  Neuro deficit, acute, stroke suspected EXAM: CT ANGIOGRAPHY HEAD AND NECK WITH AND WITHOUT CONTRAST TECHNIQUE: Multidetector CT imaging of the head and neck was performed using the standard protocol during bolus administration of intravenous contrast. Multiplanar CT image reconstructions and MIPs were obtained to evaluate the vascular anatomy. Carotid stenosis measurements (when applicable) are obtained utilizing NASCET criteria, using the distal internal carotid diameter as the denominator. RADIATION DOSE REDUCTION: This exam was performed according to the departmental dose-optimization program which includes automated exposure control, adjustment of the mA and/or kV according to patient size and/or use of iterative reconstruction technique. CONTRAST:  75mL OMNIPAQUE IOHEXOL 350 MG/ML SOLN COMPARISON:  None Available. FINDINGS: CT HEAD FINDINGS Brain: No evidence of acute infarction, hemorrhage, hydrocephalus, extra-axial collection or mass lesion/mass effect. Vascular: See below. Skull: No acute fracture. Sinuses/Orbits: Largely clear sinuses.  No acute orbital findings. Other: No mastoid effusions. Review of the MIP images confirms the above findings CTA NECK FINDINGS Aortic arch: Great vessel origins patent without significant stenosis. Right carotid system: Atherosclerosis at the carotid bifurcation without greater than 50% stenosis. Left carotid system: Atherosclerosis at the carotid bifurcation without greater than 50% stenosis. Vertebral arteries: Right dominant. No evidence of greater than 50% stenosis. Mild multifocal narrowing bilaterally. Skeleton: No acute abnormality on limited assessment. Other neck: No acute abnormality on limited assessment. Upper chest: Visualized lung apices are clear. Review of the MIP images confirms the above findings CTA HEAD FINDINGS Anterior  circulation: Bilateral intracranial ICAs, MCAs, and ACAs are patent without proximal hemodynamically significant stenosis. Posterior circulation: Bilateral posterior cerebral arteries are patent with moderate narrowing of the proximal right intradural vertebral artery. The left intradural vertebral artery is non dominant/small. Basilar artery and bilateral posterior cerebral arteries are patent without proximal hemodynamically significant stenosis. Venous sinuses: As permitted by contrast timing, patent. Anatomic variants: Detailed above. Review of the MIP images confirms the above findings IMPRESSION: 1. No large vessel occlusion. 2. Moderate right intradural vertebral artery stenosis. Electronically Signed   By: Feliberto Harts M.D.   On: 04/17/2023 15:36     LOS: 0 days   Jeoffrey Massed, MD  [facility]    To contact the attending provider between 7A-7P or the covering provider during after hours 7P-7A, please log into the web site [email or web address] and access using universal  password for that web site. If you do not have the password, please call the hospital operator.  04/18/2023, 9:26 AM

## 2023-04-18 NOTE — Progress Notes (Addendum)
STROKE TEAM PROGRESS NOTE   INTERVAL HISTORY Family at bedside. Patient admitted 5/14 after presenting with ongoing dizzy spells over the last few months.  Last 1 noted was Saturday in which patient also had witnessed trouble with his right hand and right foot with subsequent period of unresponsiveness for around 5 minutes.  Patient was back to baseline after this and had no further similar incidents since. On exam today no focal weakness seen.  Patient is alert and oriented with no dysarthria or aphasia. MRI, EEG negative.  Plavix started. Per patient, unable to take aspirin per his GI doctor due to history of diverticulitis.  Home Crestor continued. Keppra started due to possible seizure etiology.   Vitals:   04/18/23 0748 04/18/23 1034 04/18/23 1035 04/18/23 1037  BP: 130/75 139/66 (!) 143/70 (!) 152/70  Pulse: (!) 55     Resp: 12     Temp: 98.2 F (36.8 C)     TempSrc: Oral     SpO2: 94%     Weight:      Height:       CBC:  Recent Labs  Lab 04/17/23 1335 04/18/23 0737  WBC 5.8 6.0  HGB 12.4* 12.9*  HCT 35.7* 37.2*  MCV 91.5 92.1  PLT 274 289   Basic Metabolic Panel:  Recent Labs  Lab 04/17/23 1335 04/18/23 0737  NA 135 137  K 4.0 4.1  CL 104 105  CO2 21* 23  GLUCOSE 178* 126*  BUN 27* 22  CREATININE 1.24 1.02  CALCIUM 8.8* 9.3   Lipid Panel:  Recent Labs  Lab 04/18/23 0737  CHOL 126  TRIG 102  HDL 46  CHOLHDL 2.7  VLDL 20  LDLCALC 60   HgbA1c:  Recent Labs  Lab 04/18/23 0737  HGBA1C 6.4*   Urine Drug Screen: No results for input(s): "LABOPIA", "COCAINSCRNUR", "LABBENZ", "AMPHETMU", "THCU", "LABBARB" in the last 168 hours.  Alcohol Level No results for input(s): "ETH" in the last 168 hours.  IMAGING past 24 hours ECHOCARDIOGRAM COMPLETE BUBBLE STUDY  Result Date: 04/18/2023    ECHOCARDIOGRAM REPORT   Patient Name:   Aqil Wahle Date of Exam: 04/18/2023 Medical Rec #:  [id number]  Height:       65.0 in Accession #:    [id number] Weight:       182.0 lb  Date of Birth:  07-07-1951  BSA:          1.900 m Patient Age:    71 years   BP:           152/70 mmHg Patient Gender: M          HR:           64 bpm. Exam Location:  Inpatient Procedure: 2D Echo, Cardiac Doppler, Color Doppler, Saline Contrast Bubble Study            and Intracardiac Opacification Agent Indications:    Stroke  History:        Patient has prior history of Echocardiogram examinations, most                 recent 03/24/2021. Arrythmias:LBBB; Risk Factors:Hypertension,                 Dyslipidemia and Diabetes.  Sonographer:    Milda Smart Referring Phys: Vernetta Honey MANSY IMPRESSIONS  1. Dyskinetic apex without evidence of LV thrombus, best seen on contrast image clip 90. Left ventricular ejection fraction, by estimation, is 50 to 55%. The left ventricle has  low normal function. The left ventricle demonstrates regional wall motion abnormalities (see scoring diagram/findings for description). There is mild concentric left ventricular hypertrophy. Left ventricular diastolic parameters are consistent with Grade I diastolic dysfunction (impaired relaxation).  2. Right ventricular systolic function is normal. The right ventricular size is normal.  3. The mitral valve is normal in structure. No evidence of mitral valve regurgitation. No evidence of mitral stenosis.  4. The aortic valve is tricuspid. There is mild calcification of the aortic valve. Aortic valve regurgitation is trivial. Aortic valve sclerosis is present, with no evidence of aortic valve stenosis.  5. Agitated saline contrast bubble study was negative, with no evidence of any interatrial shunt. Comparison(s): No prior Echocardiogram. FINDINGS  Left Ventricle: Dyskinetic apex without evidence of LV thrombus, best seen on contrast image clip 90. Left ventricular ejection fraction, by estimation, is 50 to 55%. The left ventricle has low normal function. The left ventricle demonstrates regional wall motion abnormalities. Definity contrast agent was  given IV to delineate the left ventricular endocardial borders. The left ventricular internal cavity size was normal in size. There is mild concentric left ventricular hypertrophy. Abnormal (paradoxical) septal motion, consistent with left bundle branch block. Left ventricular diastolic parameters are consistent with Grade I diastolic dysfunction (impaired relaxation).  LV Wall Scoring: The apical septal segment and apex are dyskinetic. Right Ventricle: The right ventricular size is normal. No increase in right ventricular wall thickness. Right ventricular systolic function is normal. Left Atrium: Left atrial size was normal in size. Right Atrium: Right atrial size was normal in size. Pericardium: There is no evidence of pericardial effusion. Mitral Valve: The mitral valve is normal in structure. No evidence of mitral valve regurgitation. No evidence of mitral valve stenosis. MV peak gradient, 7.4 mmHg. The mean mitral valve gradient is 3.0 mmHg. Tricuspid Valve: The tricuspid valve is normal in structure. Tricuspid valve regurgitation is mild . No evidence of tricuspid stenosis. Aortic Valve: The aortic valve is tricuspid. There is mild calcification of the aortic valve. Aortic valve regurgitation is trivial. Aortic valve sclerosis is present, with no evidence of aortic valve stenosis. Pulmonic Valve: The pulmonic valve was normal in structure. Pulmonic valve regurgitation is not visualized. No evidence of pulmonic stenosis. Aorta: The aortic root and ascending aorta are structurally normal, with no evidence of dilitation. IAS/Shunts: No atrial level shunt detected by color flow Doppler. Agitated saline contrast was given intravenously to evaluate for intracardiac shunting. Agitated saline contrast bubble study was negative, with no evidence of any interatrial shunt.  LEFT VENTRICLE PLAX 2D LVIDd:         4.20 cm      Diastology LVIDs:         3.10 cm      LV e' medial:    5.33 cm/s LV PW:         1.10 cm      LV  E/e' medial:  15.8 LV IVS:        1.20 cm      LV e' lateral:   8.49 cm/s LVOT diam:     2.00 cm      LV E/e' lateral: 9.9 LV SV:         101 LV SV Index:   53 LVOT Area:     3.14 cm  LV Volumes (MOD) LV vol d, MOD A4C: 124.0 ml LV vol s, MOD A4C: 59.5 ml LV SV MOD A4C:     124.0 ml RIGHT VENTRICLE RV S  prime:     15.90 cm/s LEFT ATRIUM             Index        RIGHT ATRIUM           Index LA diam:        3.20 cm 1.68 cm/m   RA Area:     15.10 cm LA Vol (A2C):   58.5 ml 30.78 ml/m  RA Volume:   39.60 ml  20.84 ml/m LA Vol (A4C):   36.0 ml 18.94 ml/m LA Biplane Vol: 45.9 ml 24.15 ml/m  AORTIC VALVE LVOT Vmax:   168.00 cm/s LVOT Vmean:  111.000 cm/s LVOT VTI:    0.320 m  AORTA Ao Root diam: 3.30 cm Ao Asc diam:  3.50 cm MITRAL VALVE                TRICUSPID VALVE MV Area (PHT): 3.13 cm     TR Peak grad:   15.8 mmHg MV Area VTI:   3.04 cm     TR Vmax:        199.00 cm/s MV Peak grad:  7.4 mmHg MV Mean grad:  3.0 mmHg     SHUNTS MV Vmax:       1.36 m/s     Systemic VTI:  0.32 m MV Vmean:      87.0 cm/s    Systemic Diam: 2.00 cm MV Decel Time: 242 msec MV E velocity: 84.40 cm/s MV A velocity: 107.00 cm/s MV E/A ratio:  0.79 Riley Lam MD Electronically signed by Riley Lam MD Signature Date/Time: 04/18/2023/12:53:21 PM    Final    EEG adult  Result Date: 04/18/2023 Charlsie Quest, MD     04/18/2023 10:27 AM Patient Name: [name] MRN: [id number] Epilepsy Attending: Charlsie Quest Referring Physician/Provider: Hannah Beat, MD Date: 04/18/2023 Duration: 23.44 mins Patient history:  72 y.o. male with a PMHx of DM, asthma, diverticulitis, HLD and HTN who presented to the ED on Tuesday after having had a syncopal spell on Saturday after which he was observed to be in an awake, unresponsive state for about 5 minutes before spontaneously recovering. He has no prior history of similar events. EEG to evaluate for seizure Level of alertness: Awake, asleep AEDs during EEG study: None Technical  aspects: This EEG study was done with scalp electrodes positioned according to the 10-20 International system of electrode placement. Electrical activity was reviewed with band pass filter of 1-70Hz , sensitivity of 7 uV/mm, display speed of 34mm/sec with a 60Hz  notched filter applied as appropriate. EEG data were recorded continuously and digitally stored.  Video monitoring was available and reviewed as appropriate. Description: The posterior dominant rhythm consists of 9-10 Hz activity of moderate voltage (25-35 uV) seen predominantly in posterior head regions, symmetric and reactive to eye opening and eye closing. Sleep was characterized by vertex waves, maximal frontocentral region. Hyperventilation and photic stimulation were not performed.   IMPRESSION: This study is within normal limits. No seizures or epileptiform discharges were seen throughout the recording. A normal interictal EEG does not exclude the diagnosis of epilepsy. Charlsie Quest   MR BRAIN W WO CONTRAST  Result Date: 04/17/2023 CLINICAL DATA:  Follow-up examination for neuro deficit, stroke/TIA. EXAM: MRI HEAD WITHOUT AND WITH CONTRAST TECHNIQUE: Multiplanar, multiecho pulse sequences of the brain and surrounding structures were obtained without and with intravenous contrast. CONTRAST:  8mL GADAVIST GADOBUTROL 1 MMOL/ML IV SOLN COMPARISON:  Prior study from earlier the same  day. FINDINGS: Brain: Cerebral volume within normal limits. Few scattered patchy subcentimeter foci of T2/FLAIR hyperintensity noted involving the supratentorial cerebral white matter, nonspecific, but overall minimal in nature, and less than is typically seen for patient age. No evidence for acute or subacute ischemia. Gray-white matter differentiation maintained. Ors or chronic cortical infarction. No acute or chronic intracranial blood products. No mass lesion, midline shift or mass effect. No hydrocephalus or extra-axial fluid collection. Pituitary gland and  suprasellar region within normal limits. No abnormal enhancement. Vascular: Major intracranial vascular flow voids are maintained. Skull and upper cervical spine: Craniocervical junction within normal limits. Bone marrow signal intensity normal. No scalp soft tissue abnormality. Sinuses/Orbits: Prior bilateral ocular lens replacement. Globes and orbital soft tissues demonstrate no acute finding. Few small retention cysts noted about the maxillary sinuses. Mild mucosal thickening about the ethmoidal air cells and maxillary sinuses noted as well. No significant mastoid effusion. Other: None. IMPRESSION: Normal brain MRI for age. No acute intracranial abnormality identified. Electronically Signed   By: Rise Mu M.D.   On: 04/17/2023 23:04   CT Angio Head Neck W WO CM  Result Date: 04/17/2023 CLINICAL DATA:  Neuro deficit, acute, stroke suspected EXAM: CT ANGIOGRAPHY HEAD AND NECK WITH AND WITHOUT CONTRAST TECHNIQUE: Multidetector CT imaging of the head and neck was performed using the standard protocol during bolus administration of intravenous contrast. Multiplanar CT image reconstructions and MIPs were obtained to evaluate the vascular anatomy. Carotid stenosis measurements (when applicable) are obtained utilizing NASCET criteria, using the distal internal carotid diameter as the denominator. RADIATION DOSE REDUCTION: This exam was performed according to the departmental dose-optimization program which includes automated exposure control, adjustment of the mA and/or kV according to patient size and/or use of iterative reconstruction technique. CONTRAST:  75mL OMNIPAQUE IOHEXOL 350 MG/ML SOLN COMPARISON:  None Available. FINDINGS: CT HEAD FINDINGS Brain: No evidence of acute infarction, hemorrhage, hydrocephalus, extra-axial collection or mass lesion/mass effect. Vascular: See below. Skull: No acute fracture. Sinuses/Orbits: Largely clear sinuses.  No acute orbital findings. Other: No mastoid  effusions. Review of the MIP images confirms the above findings CTA NECK FINDINGS Aortic arch: Great vessel origins patent without significant stenosis. Right carotid system: Atherosclerosis at the carotid bifurcation without greater than 50% stenosis. Left carotid system: Atherosclerosis at the carotid bifurcation without greater than 50% stenosis. Vertebral arteries: Right dominant. No evidence of greater than 50% stenosis. Mild multifocal narrowing bilaterally. Skeleton: No acute abnormality on limited assessment. Other neck: No acute abnormality on limited assessment. Upper chest: Visualized lung apices are clear. Review of the MIP images confirms the above findings CTA HEAD FINDINGS Anterior circulation: Bilateral intracranial ICAs, MCAs, and ACAs are patent without proximal hemodynamically significant stenosis. Posterior circulation: Bilateral posterior cerebral arteries are patent with moderate narrowing of the proximal right intradural vertebral artery. The left intradural vertebral artery is non dominant/small. Basilar artery and bilateral posterior cerebral arteries are patent without proximal hemodynamically significant stenosis. Venous sinuses: As permitted by contrast timing, patent. Anatomic variants: Detailed above. Review of the MIP images confirms the above findings IMPRESSION: 1. No large vessel occlusion. 2. Moderate right intradural vertebral artery stenosis. Electronically Signed   By: Feliberto Harts M.D.   On: 04/17/2023 15:36    PHYSICAL EXAM Physical Exam  HEENT-  [place]/AT    Cardio - RRR Lungs- Respirations unlabored   Neurological Examination Mental Status: Alert, oriented to self, place, time, situation.  Speech fluent without evidence of aphasia.  Able to follow all commands without difficulty.  Cranial Nerves: II:  PERRL. VF Full. III,IV, VI: No ptosis. EOMI.  V: Temp sensation equal bilaterally  VII: Smile symmetric VIII: Hearing intact to voice IX,X: No hoarseness XI:  Symmetric shoulder shrug XII: Midline tongue extension Motor: BUE 5/5 proximally and distally BLE 5/5 proximally and distally  No pronator drift.  Sensory: light touch intact throughout, bilaterally.  Cerebellar: No ataxia with FNF bilaterally  Gait: Deferred  ASSESSMENT/PLAN Mr. Airam Raifsnider is a 72 y.o. male with history of diabetes type 2, asthma, diverticulitis, hyperlipidemia, hypertension who presented to ED 5/14 after having syncopal spell on Saturday.  Patient stated that he was having brief episodes of "dizzy spells" for the past couple months.  On Saturday, he noticed having a hard time using his right hand while eating and then his wife noticed him dragging his right foot he then developed a blank stare and stated that he felt dizzy.  The wife said she lowered him onto the ground and that his eyes remained open but was not responding for around 5 minutes.  He was back to baseline after this resolved.  Denied any facial droop, vision changes, changes in speech, focal weaknesses.  Since that episode he has been baseline but wife is stated that he seems more confused.  Patient has denied any headache, neck pain, chest pain, recent infection, nausea, vomiting.   Differentials include TIA, syncopal episode, strokelike symptoms.  Seizure is also in the differentials due to presentation of blank stare and unresponsive, EEG completed and negative.  Patient has no history of seizures.  Orthostatic vitals completed and were negative, but this does not rule out orthostatic hypertension as a precursor to symptoms.  Patient is on opioids for chronic back pain denies increased use before symptoms presented.   Likely left brain complex seizure, although TIA in DDx Presented right hand shaking during meal -> RLE discoordination -> staring spells with LOC lasting 5-7 min -> recovery with short post ictal Episodes of AMS and dizziness for the last 6 months, average 1-2 per week, short lasting, did not seek  for medical attention CT head No acute abnormality.   CTA head & neck: No LVO MRI: Normal brain MRI for age. No acute intracranial abnormality.  2D Echo: EF 50 to 55%, negative bubble study with no evidence of shunt. Spot EEG: Within normal limits.  No seizures or epileptiform discharges seen. Started on Keppra 500 BID due to probable seizures as etiology of symptoms.  Seizure precautions discussed with patient. No driving until seizure free for 6 months and under physician's care LDL 60 HgbA1c 6.4 VTE prophylaxis - lovenox No antithrombotic prior to admission, now on Plavix. Patient is unable to take Aspirin per GI doctor, history of GI bleeds and diverticulitis. Continue Plavix at discharge.  Therapy recommendations:  no follow-up Disposition:  pending  Hypertension Home meds: Lisinopril 40 mg, Norvasc 10 mg Stable Long-term BP goal normotensive  Hyperlipidemia Home meds:  Crestor 20mg , fenofibrate 160 mg, resumed in hospital LDL 60, goal < 70 Continue statin at discharge  Diabetes type II Controlled Home meds: Metformin 500 mg,  HgbA1c 6.4, goal < 7.0 CBGs SSI Close PCP follow up  Other Stroke Risk Factors Advanced Age >/= 78  Former Cigarette smoker Obesity, Body mass index is 30.29 kg/m., BMI >/= 30 associated with increased stroke risk, recommend weight loss, diet and exercise as appropriate   Other Active Problems Chronic back pain History of asthma History of diverticulitis History of kidney stones  Hospital day #  0   Pt seen by Neuro NP/APP and later by MD. Note/plan to be edited by MD as needed.    Patient is OK for discharge from neurology standpoint, with recommendations as above. Follow-up with outpatient neurology in 8 weeks.   SEIZURE PRECAUTIONS Per [place] statutes, patients with seizures are not allowed to drive until they have been seizure-free for six months.   Use caution when using heavy equipment or power tools. Avoid working on  ladders or at heights. Take showers instead of baths. Ensure the water temperature is not too high on the home water heater. Do not go swimming alone. Do not lock yourself in a room alone (i.e. bathroom). When caring for infants or small children, sit down when holding, feeding, or changing them to minimize risk of injury to the child in the event you have a seizure. Maintain good sleep hygiene. Avoid alcohol.    If patient has another seizure, call 911 and bring them back to the ED if: A.  The seizure lasts longer than 5 minutes.      B.  The patient doesn't wake shortly after the seizure or has new problems such as difficulty seeing, speaking or moving following the seizure C.  The patient was injured during the seizure D.  The patient has a temperature over 102 F (39C) E.  The patient vomited during the seizure and now is having trouble breathing   Lynnae January, DNP, AGACNP-BC [facility] Please use AMION for contact information & EPIC for messaging.  ATTENDING NOTE: I reviewed above note and agree with the assessment and plan. Pt was seen and examined.   Wife at the bedside. Pt sitting in chair, neuro intact except slow on WORLD back spelling and difficulty with serial 7s, but delayed recall 3/3, fully orientated. No focal deficit.   Per wife, pt for the last several months, had spells of AMS, pt hold head complaining of dizziness, last short periods, did not seek medical attention. Saturday night, he was eating buffet, had right hand shaking, not able to serve himself hamberger. After meal, during dancing, wife noted right leg weakness and then he collapsed with eyes open and staring spells, not responding for 5-7 min and then gradually back to normal.   Symptoms concerning for complex seizure origin from left brain. Spot EEG neg. Stroke work up neg. Will start keppra, seizure precaution and no driving until 6 months of seizure free. Given TIA in the DDx, will also do plavix  and continue crestor and fenofibrate. Follow up at [facility] in 4 weeks.   For detailed assessment and plan, please refer to above/below as I have made changes wherever appropriate.   Neurology will sign off. Please call with questions. Pt will follow up with stroke clinic NP at [facility] in about 4 weeks. Thanks for the consult.   Marvel Plan, MD PhD Stroke Neurology 04/18/2023 10:14 PM  I discussed with Dr. Jerral Ralph. I spent  additional 30 inpatient minutes face-to-face time with the patient and his wife, more than 50% of which was spent in counseling and coordination of care, reviewing test results, images and medication, and discussing the diagnosis, treatment plan and potential prognosis. This patient's care requiresreview of multiple databases, neurological assessment, discussion with family, other specialists and medical decision making of high complexity.      To contact Stroke Continuity provider, please refer to [email or web address]. After hours, contact General Neurology

## 2023-04-18 NOTE — Assessment & Plan Note (Signed)
-   We will continue statin therapy. 

## 2023-04-18 NOTE — Progress Notes (Signed)
SLP Cancellation Note  Patient Details Name: [name] MRN: [id number] DOB: March 06, 1951   Cancelled treatment:       Reason Eval/Treat Not Completed: SLP screened, no needs identified, will sign off. Pt did not present with any acute speech, language, or cognitive-linguistic deficits on admission, MRI was negative for acute changes, and no overt communication or cognitive-linguistic deficits have been documented. A formal evaluation does not appear to be clinically indicated at this time. SLP will sign off.  Gwynevere Lizana I. Vear Clock, MS, CCC-SLP Neuro Diagnostic Specialist  Acute Rehabilitation Services Office number: [phone]  Scheryl Marten 04/18/2023, 8:24 AM

## 2023-04-18 NOTE — Assessment & Plan Note (Addendum)
-   The patient will be admitted to an observation medically monitored bed.   - We will follow neuro checks q.4 hours for 24 hours.   - The patient will be placed on Plavix. - Will obtain a brain MRI without contrast as well as  2D echo with bubble study .   - A neurology consultation  as well as physical/occupation/speech therapy consults will be obtained in a.m.Marland Kitchen   - The patient will be placed on statin therapy and fasting lipids will be checked. - We will obtain an EEG per neurology recommendation.

## 2023-04-18 NOTE — Procedures (Signed)
Patient Name: [name]  MRN: [id number]  Epilepsy Attending: Charlsie Quest  Referring Physician/Provider: Hannah Beat, MD  Date: 04/18/2023 Duration: 23.44 mins  Patient history:  72 y.o. male with a PMHx of DM, asthma, diverticulitis, HLD and HTN who presented to the ED on Tuesday after having had a syncopal spell on Saturday after which he was observed to be in an awake, unresponsive state for about 5 minutes before spontaneously recovering. He has no prior history of similar events. EEG to evaluate for seizure  Level of alertness: Awake, asleep  AEDs during EEG study: None  Technical aspects: This EEG study was done with scalp electrodes positioned according to the 10-20 International system of electrode placement. Electrical activity was reviewed with band pass filter of 1-70Hz , sensitivity of 7 uV/mm, display speed of 27mm/sec with a 60Hz  notched filter applied as appropriate. EEG data were recorded continuously and digitally stored.  Video monitoring was available and reviewed as appropriate.  Description: The posterior dominant rhythm consists of 9-10 Hz activity of moderate voltage (25-35 uV) seen predominantly in posterior head regions, symmetric and reactive to eye opening and eye closing. Sleep was characterized by vertex waves, maximal frontocentral region. Hyperventilation and photic stimulation were not performed.     IMPRESSION: This study is within normal limits. No seizures or epileptiform discharges were seen throughout the recording.  A normal interictal EEG does not exclude the diagnosis of epilepsy.  [name]

## 2023-04-18 NOTE — Evaluation (Signed)
Physical Therapy Evaluation Patient Details Name: [name] MRN: [id number] DOB: 03-25-1951 Today's Date: 04/18/2023  History of Present Illness  72 y/o M admitted to [facility] on 5/14 for a syncopal episode. MRI of brain negative for acute CVA, CT angio showing moderate right intradural vertebral artery stenosis. EEG WNL. PMHx: HFrEF, HTN, HLD.  Clinical Impression  Pt presents today at or close to his mobility baseline. Pt able to perform all mobility independently or with mod I, no LOB or symptoms with all mobility, able to perform head turns and 180 degree turns without LOB or onset of symptoms. Pt is very active at baseline, educated pt on easing back into activity and to monitor for symptoms, especially when changing positions. Pt reports no concerns with mobility upon discharge, feels back to his baseline. Pt with no current need for PT upon discharge, no further benefit from skilled acute PT, will sign off.        Recommendations for follow up therapy are one component of a multi-disciplinary discharge planning process, led by the attending physician.  Recommendations may be updated based on patient status, additional functional criteria and insurance authorization.  Follow Up Recommendations       Assistance Recommended at Discharge None  Patient can return home with the following       Equipment Recommendations None recommended by PT  Recommendations for Other Services       Functional Status Assessment Patient has not had a recent decline in their functional status     Precautions / Restrictions Precautions Precautions: Fall Restrictions Weight Bearing Restrictions: No      Mobility  Bed Mobility Overal bed mobility: Independent                  Transfers Overall transfer level: Independent Equipment used: None                    Ambulation/Gait Ambulation/Gait assistance: Independent Gait Distance (Feet): 400 Feet Assistive device: None Gait  Pattern/deviations: Step-through pattern, WFL(Within Functional Limits) Gait velocity: WFL     General Gait Details: gait pattern grossly WFL. Pt able to perform vertical and horizontal head turns, change his gait speed, and perform 180 degree turns with stops without LOB or sway noted  Stairs Stairs: Yes Stairs assistance: Modified independent (Device/Increase time) Stair Management: No rails, One rail Right, Alternating pattern, Forwards Number of Stairs: 11 General stair comments: ascending without rail and descending with one rail, no LOB, performing mod I  Wheelchair Mobility    Modified Rankin (Stroke Patients Only)       Balance Overall balance assessment: No apparent balance deficits (not formally assessed)                                           Pertinent Vitals/Pain Pain Assessment Pain Assessment: No/denies pain    Home Living Family/patient expects to be discharged to:: Private residence Living Arrangements: Children Available Help at Discharge: Available PRN/intermittently Type of Home: House Home Access: Stairs to enter Entrance Stairs-Rails: None Entrance Stairs-Number of Steps: 4 Alternate Level Stairs-Number of Steps: 6 Home Layout: Multi-level (3 story, entrance on second floor, pt stays on first floor) Home Equipment: Shower seat - built in      Prior Function Prior Level of Function : Independent/Modified Independent;Driving             Mobility Comments:  only fall was the syncopal episode causing admission, independent other wise, enjoys all types of dancing       Hand Dominance        Extremity/Trunk Assessment   Upper Extremity Assessment Upper Extremity Assessment: Overall WFL for tasks assessed    Lower Extremity Assessment Lower Extremity Assessment: Overall WFL for tasks assessed    Cervical / Trunk Assessment Cervical / Trunk Assessment: Normal  Communication   Communication: No difficulties   Cognition Arousal/Alertness: Awake/alert Behavior During Therapy: WFL for tasks assessed/performed Overall Cognitive Status: Within Functional Limits for tasks assessed                                 General Comments: A&Ox4, pleasant throughout session, pt's friend arriving during the session does report she has noticed pt zoning out occasionally and intermittent memory issues over the past few months, nothing noted during today's session        General Comments General comments (skin integrity, edema, etc.): VSS on room air    Exercises     Assessment/Plan    PT Assessment Patient does not need any further PT services  PT Problem List         PT Treatment Interventions      PT Goals (Current goals can be found in the Care Plan section)  Acute Rehab PT Goals Patient Stated Goal: go home PT Goal Formulation: All assessment and education complete, DC therapy    Frequency       Co-evaluation               AM-PAC PT "6 Clicks" Mobility  Outcome Measure Help needed turning from your back to your side while in a flat bed without using bedrails?: None Help needed moving from lying on your back to sitting on the side of a flat bed without using bedrails?: None Help needed moving to and from a bed to a chair (including a wheelchair)?: None Help needed standing up from a chair using your arms (e.g., wheelchair or bedside chair)?: None Help needed to walk in hospital room?: None Help needed climbing 3-5 steps with a railing? : None 6 Click Score: 24    End of Session Equipment Utilized During Treatment: Gait belt Activity Tolerance: Patient tolerated treatment well Patient left: in bed;with call bell/phone within reach;with family/visitor present Nurse Communication: Mobility status PT Visit Diagnosis: Difficulty in walking, not elsewhere classified (R26.2)    Time: 1610-9604 PT Time Calculation (min) (ACUTE ONLY): 16 min   Charges:   PT  Evaluation $PT Eval Low Complexity: 1 Low          Lindalou Hose, PT DPT Acute Rehabilitation Services Office [phone]   Leonie Man 04/18/2023, 1:48 PM

## 2023-04-18 NOTE — Discharge Summary (Addendum)
PATIENT DETAILS Name: [name] Age: 72 y.o. Sex: male Date of Birth: 13-Sep-1951 MRN: [id number]. Admitting Physician: Osvaldo Shipper, MD WUJ:[id number], Diana Eves., MD  Admit Date: 04/17/2023 Discharge date: 04/18/2023  Recommendations for Outpatient Follow-up:  Follow up with PCP in 1-2 weeks Please obtain CMP/CBC in one week Please ensure follow up with Cardiology and Neurology  Admitted From:  Home  Disposition: Home   Discharge Condition: good  CODE STATUS:   Code Status: Full Code   Diet recommendation:  Diet Order             Diet - low sodium heart healthy           Diet Carb Modified Fluid consistency: Thin; Room service appropriate? Yes  Diet effective now                    Brief Summary: Patient is a 72 y.o.  male HFrEF, HTN, HLD-who presented for evaluation of a syncopal episode.  Per patient report-he has been having spells of lightheadedness for the past several months.   Significant events: 5/14>> admit to [facility]   Significant studies: 5/14>> MRI brain: No acute CVA 5/14>> CT angio head/neck: No LVO/major stenosis-moderate right intradural vertebral artery stenosis. 5/15>> A1c: 6.4 5/15>> LDL: 60 5/15>>EEG:no seizure 5/15>>Echo: EF 50-55%   Significant microbiology data: None   Procedures: None   Consults: Neurology Cardiology   Brief Hospital Course: Syncope Admitted and underwent a workup-evaluated by both neurology and cardiology-Per neurology (spoke w Dr Jetty Peeks suspicious that he may have had seizures (left arm "tremors", suspicion for postictal confusion per wife).  Recommendations are to start Keppra.  Not felt to have TIA but neurology recommending to continue Plavix.  Since he has been having these "dizzy spells"-cardiology will arrange for outpatient heart monitor. Stable for discharge today-patient aware of seizure precautions including driving restrictions.   Chronic HFrEF Euvolemic Apparently follows with cardiology in  [place]   HLD Statin/fenofibrate   HTN BP controlled Lisinopril   DM-2 CBG stable with SSI while inpatient Resume metformin on discharge.   Gout No flare Allopurinol  Obesity: Estimated body mass index is 30.29 kg/m as calculated from the following:   Height as of this encounter: 5\' 5"  (1.651 m).   Weight as of this encounter: 82.6 kg.     Discharge Diagnoses:  Principal Problem:   TIA (transient ischemic attack) Active Problems:   Essential hypertension   Dyslipidemia   Type 2 diabetes mellitus without complications (HCC)   Gout   Discharge Instructions:  Activity:  As tolerated   Discharge Instructions     Ambulatory referral to Neurology   Complete by: As directed    An appointment is requested in approximately: 8 weeks   Diet - low sodium heart healthy   Complete by: As directed    Discharge instructions   Complete by: As directed    Follow with Primary MD  Andreas Blower., MD in 1-2 weeks  Please get a complete blood count and chemistry panel checked by your Primary MD at your next visit, and again as instructed by your Primary MD.  Get Medicines reviewed and adjusted: Please take all your medications with you for your next visit with your Primary MD  Laboratory/radiological data: Please request your Primary MD to go over all hospital tests and procedure/radiological results at the follow up, please ask your Primary MD to get all Hospital records sent to his/her office.  In some cases, they will be  blood work, cultures and biopsy results pending at the time of your discharge. Please request that your primary care M.D. follows up on these results.  Also Note the following: If you experience worsening of your admission symptoms, develop shortness of breath, life threatening emergency, suicidal or homicidal thoughts you must seek medical attention immediately by calling 911 or calling your MD immediately  if symptoms less severe.  You must read  complete instructions/literature along with all the possible adverse reactions/side effects for all the Medicines you take and that have been prescribed to you. Take any new Medicines after you have completely understood and accpet all the possible adverse reactions/side effects.   Do not drive when taking Pain medications or sleeping medications (Benzodaizepines)  Do not take more than prescribed Pain, Sleep and Anxiety Medications. It is not advisable to combine anxiety,sleep and pain medications without talking with your primary care practitioner  Special Instructions: If you have smoked or chewed Tobacco  in the last 2 yrs please stop smoking, stop any regular Alcohol  and or any Recreational drug use.  Wear Seat belts while driving.  Please note: You were cared for by a hospitalist during your hospital stay. Once you are discharged, your primary care physician will handle any further medical issues. Please note that NO REFILLS for any discharge medications will be authorized once you are discharged, as it is imperative that you return to your primary care physician (or establish a relationship with a primary care physician if you do not have one) for your post hospital discharge needs so that they can reassess your need for medications and monitor your lab values.     Seizure precautions: Per [facility] statutes, patients with seizures are not allowed to drive until they have been seizure-free for six months and cleared by a physician    Use caution when using heavy equipment or power tools. Avoid working on ladders or at heights. Take showers instead of baths. Ensure the water temperature is not too high on the home water heater. Do not go swimming alone. Do not lock yourself in a room alone (i.e. bathroom). When caring for infants or small children, sit down when holding, feeding, or changing them to minimize risk of injury to the child in the event you have a seizure. Maintain good  sleep hygiene. Avoid alcohol.    If patient has another seizure, call 911 and bring them back to the ED if: A.  The seizure lasts longer than 5 minutes.      B.  The patient doesn't wake shortly after the seizure or has new problems such as difficulty seeing, speaking or moving following the seizure C.  The patient was injured during the seizure D.  The patient has a temperature over 102 F (39C) E.  The patient vomited during the seizure and now is having trouble breathing    During the Seizure   - First, ensure adequate ventilation and place patients on the floor on their left side  Loosen clothing around the neck and ensure the airway is patent. If the patient is clenching the teeth, do not force the mouth open with any object as this can cause severe damage - Remove all items from the surrounding that can be hazardous. The patient may be oblivious to what's happening and may not even know what he or she is doing. If the patient is confused and wandering, either gently guide him/her away and block access to outside areas - Reassure  the individual and be comforting - Call 911. In most cases, the seizure ends before EMS arrives. However, there are cases when seizures may last over 3 to 5 minutes. Or the individual may have developed breathing difficulties or severe injuries. If a pregnant patient or a person with diabetes develops a seizure, it is prudent to call an ambulance. - Finally, if the patient does not regain full consciousness, then call EMS. Most patients will remain confused for about 45 to 90 minutes after a seizure, so you must use judgment in calling for help. - Avoid restraints but make sure the patient is in a bed with padded side rails - Place the individual in a lateral position with the neck slightly flexed; this will help the saliva drain from the mouth and prevent the tongue from falling backward - Remove all nearby furniture and other hazards from the area - Provide verbal  assurance as the individual is regaining consciousness - Provide the patient with privacy if possible - Call for help and start treatment as ordered by the caregiver    After the Seizure (Postictal Stage)   After a seizure, most patients experience confusion, fatigue, muscle pain and/or a headache. Thus, one should permit the individual to sleep. For the next few days, reassurance is essential. Being calm and helping reorient the person is also of importance.   Most seizures are painless and end spontaneously. Seizures are not harmful to others but can lead to complications such as stress on the lungs, brain and the heart. Individuals with prior lung problems may develop labored breathing and respiratory distress.    Increase activity slowly   Complete by: As directed       Allergies as of 04/18/2023       Reactions   Hibiclens [chlorhexidine Gluconate] Rash, Other (See Comments)   Pt. Had rash on torso after last surgery, mostly on surgical side.  Am listing CHG as potential allergy. Preop 04/14/19 tolerated CHG wipe without problem.   Ciprofloxacin Rash   Took both cipro and metronidazole together, unsure which caused the rash   Flagyl [metronidazole] Rash   Took both cipro and metronidazole together, unsure which caused the rash   Nabumetone Rash        Medication List     STOP taking these medications    omeprazole 20 MG capsule Commonly known as: PRILOSEC       TAKE these medications    albuterol 108 (90 Base) MCG/ACT inhaler Commonly known as: VENTOLIN HFA Inhale 2 puffs into the lungs every 6 (six) hours as needed for wheezing.   allopurinol 300 MG tablet Commonly known as: ZYLOPRIM Take 300 mg by mouth daily.   allopurinol 100 MG tablet Commonly known as: ZYLOPRIM Take 100 mg by mouth daily.   amLODipine 10 MG tablet Commonly known as: NORVASC Take 10 mg by mouth daily.   Artificial Tears 1.4 % ophthalmic solution Generic drug: polyvinyl alcohol Place  1 drop into both eyes 2 (two) times a day.   CINNAMON PO Take 1,000 mg by mouth daily.   clopidogrel 75 MG tablet Commonly known as: PLAVIX Take 1 tablet (75 mg total) by mouth daily. Start taking on: Apr 19, 2023   diclofenac Sodium 1 % Gel Commonly known as: Voltaren Apply 2 g topically 4 (four) times daily.   doxazosin 1 MG tablet Commonly known as: CARDURA Take 1 mg by mouth daily.   fenofibrate 160 MG tablet Take 160 mg by mouth at bedtime.  levETIRAcetam 500 MG tablet Commonly known as: KEPPRA Take 1 tablet (500 mg total) by mouth 2 (two) times daily.   lisinopril 40 MG tablet Commonly known as: ZESTRIL Take 40 mg by mouth daily.   metFORMIN 500 MG tablet Commonly known as: GLUCOPHAGE Take 500 mg by mouth in the morning and at bedtime.   methocarbamol 500 MG tablet Commonly known as: ROBAXIN Take 1 tablet (500 mg total) by mouth every 8 (eight) hours as needed for muscle spasms.   multivitamin with minerals Tabs tablet Take 1 tablet by mouth daily.   oxyCODONE 5 MG immediate release tablet Commonly known as: Oxy IR/ROXICODONE Take 1-2 tablets (5-10 mg total) by mouth every 4 (four) hours as needed for moderate pain.   pantoprazole 20 MG tablet Commonly known as: PROTONIX Take 1 tablet (20 mg total) by mouth daily.   psyllium 58.6 % packet Commonly known as: METAMUCIL Take 1 packet by mouth at bedtime.   rosuvastatin 40 MG tablet Commonly known as: CRESTOR Take 20 mg by mouth at bedtime.   sucralfate 1 g tablet Commonly known as: Carafate Take 1 tablet (1 g total) by mouth 4 (four) times daily -  with meals and at bedtime.   tadalafil 20 MG tablet Commonly known as: CIALIS Take 20 mg by mouth daily as needed for erectile dysfunction.   vitamin C 1000 MG tablet Take 1,000 mg by mouth daily.        Follow-up Information     Andreas Blower., MD. Schedule an appointment as soon as possible for a visit in 1 week(s).   Specialty: Internal  Medicine Contact information: [address] [place] [address] [phone]         [facility] Follow up.   Specialty: Neurology Why: Office will call with date/time, If you dont hear from them,please give them a call Contact information: [address] [place] [address] [phone]        [facility] Follow up.   Specialty: Cardiology Why: Office will call with date/time, If you dont hear from them,please give them a call Contact information: [address] [place] [address] [phone]               Allergies  Allergen Reactions   Hibiclens [Chlorhexidine Gluconate] Rash and Other (See Comments)    Pt. Had rash on torso after last surgery, mostly on surgical side.  Am listing CHG as potential allergy. Preop 04/14/19 tolerated CHG wipe without problem.   Ciprofloxacin Rash    Took both cipro and metronidazole together, unsure which caused the rash   Flagyl [Metronidazole] Rash    Took both cipro and metronidazole together, unsure which caused the rash   Nabumetone Rash     Other Procedures/Studies: ECHOCARDIOGRAM COMPLETE BUBBLE STUDY  Result Date: 04/18/2023    ECHOCARDIOGRAM REPORT   Patient Name:   [name] Date of Exam: 04/18/2023 Medical Rec #:  [id number]  Height:       65.0 in Accession #:    [id number] Weight:       182.0 lb Date of Birth:  1951/02/03  BSA:          1.900 m Patient Age:    71 years   BP:           152/70 mmHg Patient Gender: M          HR:  64 bpm. Exam Location:  Inpatient Procedure: 2D Echo, Cardiac Doppler, Color Doppler, Saline Contrast Bubble Study            and Intracardiac Opacification Agent Indications:    Stroke  History:        Patient has prior history of Echocardiogram examinations, most                 recent 03/24/2021. Arrythmias:LBBB; Risk Factors:Hypertension,                 Dyslipidemia  and Diabetes.  Sonographer:    Milda Smart Referring Phys: Vernetta Honey MANSY IMPRESSIONS  1. Dyskinetic apex without evidence of LV thrombus, best seen on contrast image clip 90. Left ventricular ejection fraction, by estimation, is 50 to 55%. The left ventricle has low normal function. The left ventricle demonstrates regional wall motion abnormalities (see scoring diagram/findings for description). There is mild concentric left ventricular hypertrophy. Left ventricular diastolic parameters are consistent with Grade I diastolic dysfunction (impaired relaxation).  2. Right ventricular systolic function is normal. The right ventricular size is normal.  3. The mitral valve is normal in structure. No evidence of mitral valve regurgitation. No evidence of mitral stenosis.  4. The aortic valve is tricuspid. There is mild calcification of the aortic valve. Aortic valve regurgitation is trivial. Aortic valve sclerosis is present, with no evidence of aortic valve stenosis.  5. Agitated saline contrast bubble study was negative, with no evidence of any interatrial shunt. Comparison(s): No prior Echocardiogram. FINDINGS  Left Ventricle: Dyskinetic apex without evidence of LV thrombus, best seen on contrast image clip 90. Left ventricular ejection fraction, by estimation, is 50 to 55%. The left ventricle has low normal function. The left ventricle demonstrates regional wall motion abnormalities. Definity contrast agent was given IV to delineate the left ventricular endocardial borders. The left ventricular internal cavity size was normal in size. There is mild concentric left ventricular hypertrophy. Abnormal (paradoxical) septal motion, consistent with left bundle branch block. Left ventricular diastolic parameters are consistent with Grade I diastolic dysfunction (impaired relaxation).  LV Wall Scoring: The apical septal segment and apex are dyskinetic. Right Ventricle: The right ventricular size is normal. No increase in right  ventricular wall thickness. Right ventricular systolic function is normal. Left Atrium: Left atrial size was normal in size. Right Atrium: Right atrial size was normal in size. Pericardium: There is no evidence of pericardial effusion. Mitral Valve: The mitral valve is normal in structure. No evidence of mitral valve regurgitation. No evidence of mitral valve stenosis. MV peak gradient, 7.4 mmHg. The mean mitral valve gradient is 3.0 mmHg. Tricuspid Valve: The tricuspid valve is normal in structure. Tricuspid valve regurgitation is mild . No evidence of tricuspid stenosis. Aortic Valve: The aortic valve is tricuspid. There is mild calcification of the aortic valve. Aortic valve regurgitation is trivial. Aortic valve sclerosis is present, with no evidence of aortic valve stenosis. Pulmonic Valve: The pulmonic valve was normal in structure. Pulmonic valve regurgitation is not visualized. No evidence of pulmonic stenosis. Aorta: The aortic root and ascending aorta are structurally normal, with no evidence of dilitation. IAS/Shunts: No atrial level shunt detected by color flow Doppler. Agitated saline contrast was given intravenously to evaluate for intracardiac shunting. Agitated saline contrast bubble study was negative, with no evidence of any interatrial shunt.  LEFT VENTRICLE PLAX 2D LVIDd:         4.20 cm      Diastology LVIDs:  3.10 cm      LV e' medial:    5.33 cm/s LV PW:         1.10 cm      LV E/e' medial:  15.8 LV IVS:        1.20 cm      LV e' lateral:   8.49 cm/s LVOT diam:     2.00 cm      LV E/e' lateral: 9.9 LV SV:         101 LV SV Index:   53 LVOT Area:     3.14 cm  LV Volumes (MOD) LV vol d, MOD A4C: 124.0 ml LV vol s, MOD A4C: 59.5 ml LV SV MOD A4C:     124.0 ml RIGHT VENTRICLE RV S prime:     15.90 cm/s LEFT ATRIUM             Index        RIGHT ATRIUM           Index LA diam:        3.20 cm 1.68 cm/m   RA Area:     15.10 cm LA Vol (A2C):   58.5 ml 30.78 ml/m  RA Volume:   39.60 ml   20.84 ml/m LA Vol (A4C):   36.0 ml 18.94 ml/m LA Biplane Vol: 45.9 ml 24.15 ml/m  AORTIC VALVE LVOT Vmax:   168.00 cm/s LVOT Vmean:  111.000 cm/s LVOT VTI:    0.320 m  AORTA Ao Root diam: 3.30 cm Ao Asc diam:  3.50 cm MITRAL VALVE                TRICUSPID VALVE MV Area (PHT): 3.13 cm     TR Peak grad:   15.8 mmHg MV Area VTI:   3.04 cm     TR Vmax:        199.00 cm/s MV Peak grad:  7.4 mmHg MV Mean grad:  3.0 mmHg     SHUNTS MV Vmax:       1.36 m/s     Systemic VTI:  0.32 m MV Vmean:      87.0 cm/s    Systemic Diam: 2.00 cm MV Decel Time: 242 msec MV E velocity: 84.40 cm/s MV A velocity: 107.00 cm/s MV E/A ratio:  0.79 Riley Lam MD Electronically signed by Riley Lam MD Signature Date/Time: 04/18/2023/12:53:21 PM    Final    EEG adult  Result Date: 04/18/2023 Charlsie Quest, MD     04/18/2023 10:27 AM Patient Name: [name] MRN: [id number] Epilepsy Attending: Charlsie Quest Referring Physician/Provider: Hannah Beat, MD Date: 04/18/2023 Duration: 23.44 mins Patient history:  72 y.o. male with a PMHx of DM, asthma, diverticulitis, HLD and HTN who presented to the ED on Tuesday after having had a syncopal spell on Saturday after which he was observed to be in an awake, unresponsive state for about 5 minutes before spontaneously recovering. He has no prior history of similar events. EEG to evaluate for seizure Level of alertness: Awake, asleep AEDs during EEG study: None Technical aspects: This EEG study was done with scalp electrodes positioned according to the 10-20 International system of electrode placement. Electrical activity was reviewed with band pass filter of 1-70Hz , sensitivity of 7 uV/mm, display speed of 26mm/sec with a 60Hz  notched filter applied as appropriate. EEG data were recorded continuously and digitally stored.  Video monitoring was available and reviewed as appropriate. Description: The posterior dominant rhythm consists of  9-10 Hz activity of moderate voltage  (25-35 uV) seen predominantly in posterior head regions, symmetric and reactive to eye opening and eye closing. Sleep was characterized by vertex waves, maximal frontocentral region. Hyperventilation and photic stimulation were not performed.   IMPRESSION: This study is within normal limits. No seizures or epileptiform discharges were seen throughout the recording. A normal interictal EEG does not exclude the diagnosis of epilepsy. Charlsie Quest   MR BRAIN W WO CONTRAST  Result Date: 04/17/2023 CLINICAL DATA:  Follow-up examination for neuro deficit, stroke/TIA. EXAM: MRI HEAD WITHOUT AND WITH CONTRAST TECHNIQUE: Multiplanar, multiecho pulse sequences of the brain and surrounding structures were obtained without and with intravenous contrast. CONTRAST:  8mL GADAVIST GADOBUTROL 1 MMOL/ML IV SOLN COMPARISON:  Prior study from earlier the same day. FINDINGS: Brain: Cerebral volume within normal limits. Few scattered patchy subcentimeter foci of T2/FLAIR hyperintensity noted involving the supratentorial cerebral white matter, nonspecific, but overall minimal in nature, and less than is typically seen for patient age. No evidence for acute or subacute ischemia. Gray-white matter differentiation maintained. Ors or chronic cortical infarction. No acute or chronic intracranial blood products. No mass lesion, midline shift or mass effect. No hydrocephalus or extra-axial fluid collection. Pituitary gland and suprasellar region within normal limits. No abnormal enhancement. Vascular: Major intracranial vascular flow voids are maintained. Skull and upper cervical spine: Craniocervical junction within normal limits. Bone marrow signal intensity normal. No scalp soft tissue abnormality. Sinuses/Orbits: Prior bilateral ocular lens replacement. Globes and orbital soft tissues demonstrate no acute finding. Few small retention cysts noted about the maxillary sinuses. Mild mucosal thickening about the ethmoidal air cells and  maxillary sinuses noted as well. No significant mastoid effusion. Other: None. IMPRESSION: Normal brain MRI for age. No acute intracranial abnormality identified. Electronically Signed   By: Rise Mu M.D.   On: 04/17/2023 23:04   CT Angio Head Neck W WO CM  Result Date: 04/17/2023 CLINICAL DATA:  Neuro deficit, acute, stroke suspected EXAM: CT ANGIOGRAPHY HEAD AND NECK WITH AND WITHOUT CONTRAST TECHNIQUE: Multidetector CT imaging of the head and neck was performed using the standard protocol during bolus administration of intravenous contrast. Multiplanar CT image reconstructions and MIPs were obtained to evaluate the vascular anatomy. Carotid stenosis measurements (when applicable) are obtained utilizing NASCET criteria, using the distal internal carotid diameter as the denominator. RADIATION DOSE REDUCTION: This exam was performed according to the departmental dose-optimization program which includes automated exposure control, adjustment of the mA and/or kV according to patient size and/or use of iterative reconstruction technique. CONTRAST:  75mL OMNIPAQUE IOHEXOL 350 MG/ML SOLN COMPARISON:  None Available. FINDINGS: CT HEAD FINDINGS Brain: No evidence of acute infarction, hemorrhage, hydrocephalus, extra-axial collection or mass lesion/mass effect. Vascular: See below. Skull: No acute fracture. Sinuses/Orbits: Largely clear sinuses.  No acute orbital findings. Other: No mastoid effusions. Review of the MIP images confirms the above findings CTA NECK FINDINGS Aortic arch: Great vessel origins patent without significant stenosis. Right carotid system: Atherosclerosis at the carotid bifurcation without greater than 50% stenosis. Left carotid system: Atherosclerosis at the carotid bifurcation without greater than 50% stenosis. Vertebral arteries: Right dominant. No evidence of greater than 50% stenosis. Mild multifocal narrowing bilaterally. Skeleton: No acute abnormality on limited assessment.  Other neck: No acute abnormality on limited assessment. Upper chest: Visualized lung apices are clear. Review of the MIP images confirms the above findings CTA HEAD FINDINGS Anterior circulation: Bilateral intracranial ICAs, MCAs, and ACAs are patent without proximal hemodynamically significant stenosis. Posterior  circulation: Bilateral posterior cerebral arteries are patent with moderate narrowing of the proximal right intradural vertebral artery. The left intradural vertebral artery is non dominant/small. Basilar artery and bilateral posterior cerebral arteries are patent without proximal hemodynamically significant stenosis. Venous sinuses: As permitted by contrast timing, patent. Anatomic variants: Detailed above. Review of the MIP images confirms the above findings IMPRESSION: 1. No large vessel occlusion. 2. Moderate right intradural vertebral artery stenosis. Electronically Signed   By: Feliberto Harts M.D.   On: 04/17/2023 15:36     TODAY-DAY OF DISCHARGE:  Subjective:   [name] today has no headache,no chest abdominal pain,no new weakness tingling or numbness, feels much better wants to go home today.   Objective:   Blood pressure 118/71, pulse (!) 55, temperature 98.3 F (36.8 C), temperature source Oral, resp. rate 12, height 5\' 5"  (1.651 m), weight 82.6 kg, SpO2 94 %. No intake or output data in the 24 hours ending 04/18/23 1639 Filed Weights   04/17/23 1254 04/17/23 2059  Weight: 81.6 kg 82.6 kg    Exam: Awake Alert, Oriented *3, No new F.N deficits, Normal affect [place].AT,PERRAL Supple Neck,No JVD, No cervical lymphadenopathy appriciated.  Symmetrical Chest wall movement, Good air movement bilaterally, CTAB RRR,No Gallops,Rubs or new Murmurs, No Parasternal Heave +ve B.Sounds, Abd Soft, Non tender, No organomegaly appriciated, No rebound -guarding or rigidity. No Cyanosis, Clubbing or edema, No new Rash or bruise   PERTINENT RADIOLOGIC STUDIES: ECHOCARDIOGRAM COMPLETE  BUBBLE STUDY  Result Date: 04/18/2023    ECHOCARDIOGRAM REPORT   Patient Name:   Ademide Moreau Date of Exam: 04/18/2023 Medical Rec #:  [id number]  Height:       65.0 in Accession #:    [id number] Weight:       182.0 lb Date of Birth:  09-Mar-1951  BSA:          1.900 m Patient Age:    71 years   BP:           152/70 mmHg Patient Gender: M          HR:           64 bpm. Exam Location:  Inpatient Procedure: 2D Echo, Cardiac Doppler, Color Doppler, Saline Contrast Bubble Study            and Intracardiac Opacification Agent Indications:    Stroke  History:        Patient has prior history of Echocardiogram examinations, most                 recent 03/24/2021. Arrythmias:LBBB; Risk Factors:Hypertension,                 Dyslipidemia and Diabetes.  Sonographer:    Milda Smart Referring Phys: Vernetta Honey MANSY IMPRESSIONS  1. Dyskinetic apex without evidence of LV thrombus, best seen on contrast image clip 90. Left ventricular ejection fraction, by estimation, is 50 to 55%. The left ventricle has low normal function. The left ventricle demonstrates regional wall motion abnormalities (see scoring diagram/findings for description). There is mild concentric left ventricular hypertrophy. Left ventricular diastolic parameters are consistent with Grade I diastolic dysfunction (impaired relaxation).  2. Right ventricular systolic function is normal. The right ventricular size is normal.  3. The mitral valve is normal in structure. No evidence of mitral valve regurgitation. No evidence of mitral stenosis.  4. The aortic valve is tricuspid. There is mild calcification of the aortic valve. Aortic valve regurgitation is trivial. Aortic valve sclerosis is present, with  no evidence of aortic valve stenosis.  5. Agitated saline contrast bubble study was negative, with no evidence of any interatrial shunt. Comparison(s): No prior Echocardiogram. FINDINGS  Left Ventricle: Dyskinetic apex without evidence of LV thrombus, best seen on contrast  image clip 90. Left ventricular ejection fraction, by estimation, is 50 to 55%. The left ventricle has low normal function. The left ventricle demonstrates regional wall motion abnormalities. Definity contrast agent was given IV to delineate the left ventricular endocardial borders. The left ventricular internal cavity size was normal in size. There is mild concentric left ventricular hypertrophy. Abnormal (paradoxical) septal motion, consistent with left bundle branch block. Left ventricular diastolic parameters are consistent with Grade I diastolic dysfunction (impaired relaxation).  LV Wall Scoring: The apical septal segment and apex are dyskinetic. Right Ventricle: The right ventricular size is normal. No increase in right ventricular wall thickness. Right ventricular systolic function is normal. Left Atrium: Left atrial size was normal in size. Right Atrium: Right atrial size was normal in size. Pericardium: There is no evidence of pericardial effusion. Mitral Valve: The mitral valve is normal in structure. No evidence of mitral valve regurgitation. No evidence of mitral valve stenosis. MV peak gradient, 7.4 mmHg. The mean mitral valve gradient is 3.0 mmHg. Tricuspid Valve: The tricuspid valve is normal in structure. Tricuspid valve regurgitation is mild . No evidence of tricuspid stenosis. Aortic Valve: The aortic valve is tricuspid. There is mild calcification of the aortic valve. Aortic valve regurgitation is trivial. Aortic valve sclerosis is present, with no evidence of aortic valve stenosis. Pulmonic Valve: The pulmonic valve was normal in structure. Pulmonic valve regurgitation is not visualized. No evidence of pulmonic stenosis. Aorta: The aortic root and ascending aorta are structurally normal, with no evidence of dilitation. IAS/Shunts: No atrial level shunt detected by color flow Doppler. Agitated saline contrast was given intravenously to evaluate for intracardiac shunting. Agitated saline contrast  bubble study was negative, with no evidence of any interatrial shunt.  LEFT VENTRICLE PLAX 2D LVIDd:         4.20 cm      Diastology LVIDs:         3.10 cm      LV e' medial:    5.33 cm/s LV PW:         1.10 cm      LV E/e' medial:  15.8 LV IVS:        1.20 cm      LV e' lateral:   8.49 cm/s LVOT diam:     2.00 cm      LV E/e' lateral: 9.9 LV SV:         101 LV SV Index:   53 LVOT Area:     3.14 cm  LV Volumes (MOD) LV vol d, MOD A4C: 124.0 ml LV vol s, MOD A4C: 59.5 ml LV SV MOD A4C:     124.0 ml RIGHT VENTRICLE RV S prime:     15.90 cm/s LEFT ATRIUM             Index        RIGHT ATRIUM           Index LA diam:        3.20 cm 1.68 cm/m   RA Area:     15.10 cm LA Vol (A2C):   58.5 ml 30.78 ml/m  RA Volume:   39.60 ml  20.84 ml/m LA Vol (A4C):   36.0 ml 18.94 ml/m LA Biplane Vol:  45.9 ml 24.15 ml/m  AORTIC VALVE LVOT Vmax:   168.00 cm/s LVOT Vmean:  111.000 cm/s LVOT VTI:    0.320 m  AORTA Ao Root diam: 3.30 cm Ao Asc diam:  3.50 cm MITRAL VALVE                TRICUSPID VALVE MV Area (PHT): 3.13 cm     TR Peak grad:   15.8 mmHg MV Area VTI:   3.04 cm     TR Vmax:        199.00 cm/s MV Peak grad:  7.4 mmHg MV Mean grad:  3.0 mmHg     SHUNTS MV Vmax:       1.36 m/s     Systemic VTI:  0.32 m MV Vmean:      87.0 cm/s    Systemic Diam: 2.00 cm MV Decel Time: 242 msec MV E velocity: 84.40 cm/s MV A velocity: 107.00 cm/s MV E/A ratio:  0.79 Riley Lam MD Electronically signed by Riley Lam MD Signature Date/Time: 04/18/2023/12:53:21 PM    Final    EEG adult  Result Date: 04/18/2023 Charlsie Quest, MD     04/18/2023 10:27 AM Patient Name: [name] MRN: [id number] Epilepsy Attending: Charlsie Quest Referring Physician/Provider: Hannah Beat, MD Date: 04/18/2023 Duration: 23.44 mins Patient history:  72 y.o. male with a PMHx of DM, asthma, diverticulitis, HLD and HTN who presented to the ED on Tuesday after having had a syncopal spell on Saturday after which he was observed to be in an  awake, unresponsive state for about 5 minutes before spontaneously recovering. He has no prior history of similar events. EEG to evaluate for seizure Level of alertness: Awake, asleep AEDs during EEG study: None Technical aspects: This EEG study was done with scalp electrodes positioned according to the 10-20 International system of electrode placement. Electrical activity was reviewed with band pass filter of 1-70Hz , sensitivity of 7 uV/mm, display speed of 14mm/sec with a 60Hz  notched filter applied as appropriate. EEG data were recorded continuously and digitally stored.  Video monitoring was available and reviewed as appropriate. Description: The posterior dominant rhythm consists of 9-10 Hz activity of moderate voltage (25-35 uV) seen predominantly in posterior head regions, symmetric and reactive to eye opening and eye closing. Sleep was characterized by vertex waves, maximal frontocentral region. Hyperventilation and photic stimulation were not performed.   IMPRESSION: This study is within normal limits. No seizures or epileptiform discharges were seen throughout the recording. A normal interictal EEG does not exclude the diagnosis of epilepsy. Charlsie Quest   MR BRAIN W WO CONTRAST  Result Date: 04/17/2023 CLINICAL DATA:  Follow-up examination for neuro deficit, stroke/TIA. EXAM: MRI HEAD WITHOUT AND WITH CONTRAST TECHNIQUE: Multiplanar, multiecho pulse sequences of the brain and surrounding structures were obtained without and with intravenous contrast. CONTRAST:  8mL GADAVIST GADOBUTROL 1 MMOL/ML IV SOLN COMPARISON:  Prior study from earlier the same day. FINDINGS: Brain: Cerebral volume within normal limits. Few scattered patchy subcentimeter foci of T2/FLAIR hyperintensity noted involving the supratentorial cerebral white matter, nonspecific, but overall minimal in nature, and less than is typically seen for patient age. No evidence for acute or subacute ischemia. Gray-white matter differentiation  maintained. Ors or chronic cortical infarction. No acute or chronic intracranial blood products. No mass lesion, midline shift or mass effect. No hydrocephalus or extra-axial fluid collection. Pituitary gland and suprasellar region within normal limits. No abnormal enhancement. Vascular: Major intracranial vascular flow voids are maintained. Skull  and upper cervical spine: Craniocervical junction within normal limits. Bone marrow signal intensity normal. No scalp soft tissue abnormality. Sinuses/Orbits: Prior bilateral ocular lens replacement. Globes and orbital soft tissues demonstrate no acute finding. Few small retention cysts noted about the maxillary sinuses. Mild mucosal thickening about the ethmoidal air cells and maxillary sinuses noted as well. No significant mastoid effusion. Other: None. IMPRESSION: Normal brain MRI for age. No acute intracranial abnormality identified. Electronically Signed   By: Rise Mu M.D.   On: 04/17/2023 23:04   CT Angio Head Neck W WO CM  Result Date: 04/17/2023 CLINICAL DATA:  Neuro deficit, acute, stroke suspected EXAM: CT ANGIOGRAPHY HEAD AND NECK WITH AND WITHOUT CONTRAST TECHNIQUE: Multidetector CT imaging of the head and neck was performed using the standard protocol during bolus administration of intravenous contrast. Multiplanar CT image reconstructions and MIPs were obtained to evaluate the vascular anatomy. Carotid stenosis measurements (when applicable) are obtained utilizing NASCET criteria, using the distal internal carotid diameter as the denominator. RADIATION DOSE REDUCTION: This exam was performed according to the departmental dose-optimization program which includes automated exposure control, adjustment of the mA and/or kV according to patient size and/or use of iterative reconstruction technique. CONTRAST:  75mL OMNIPAQUE IOHEXOL 350 MG/ML SOLN COMPARISON:  None Available. FINDINGS: CT HEAD FINDINGS Brain: No evidence of acute infarction,  hemorrhage, hydrocephalus, extra-axial collection or mass lesion/mass effect. Vascular: See below. Skull: No acute fracture. Sinuses/Orbits: Largely clear sinuses.  No acute orbital findings. Other: No mastoid effusions. Review of the MIP images confirms the above findings CTA NECK FINDINGS Aortic arch: Great vessel origins patent without significant stenosis. Right carotid system: Atherosclerosis at the carotid bifurcation without greater than 50% stenosis. Left carotid system: Atherosclerosis at the carotid bifurcation without greater than 50% stenosis. Vertebral arteries: Right dominant. No evidence of greater than 50% stenosis. Mild multifocal narrowing bilaterally. Skeleton: No acute abnormality on limited assessment. Other neck: No acute abnormality on limited assessment. Upper chest: Visualized lung apices are clear. Review of the MIP images confirms the above findings CTA HEAD FINDINGS Anterior circulation: Bilateral intracranial ICAs, MCAs, and ACAs are patent without proximal hemodynamically significant stenosis. Posterior circulation: Bilateral posterior cerebral arteries are patent with moderate narrowing of the proximal right intradural vertebral artery. The left intradural vertebral artery is non dominant/small. Basilar artery and bilateral posterior cerebral arteries are patent without proximal hemodynamically significant stenosis. Venous sinuses: As permitted by contrast timing, patent. Anatomic variants: Detailed above. Review of the MIP images confirms the above findings IMPRESSION: 1. No large vessel occlusion. 2. Moderate right intradural vertebral artery stenosis. Electronically Signed   By: Feliberto Harts M.D.   On: 04/17/2023 15:36     PERTINENT LAB RESULTS: CBC: Recent Labs    04/17/23 1335 04/18/23 0737  WBC 5.8 6.0  HGB 12.4* 12.9*  HCT 35.7* 37.2*  PLT 274 289   CMET CMP     Component Value Date/Time   NA 137 04/18/2023 0737   K 4.1 04/18/2023 0737   CL 105  04/18/2023 0737   CO2 23 04/18/2023 0737   GLUCOSE 126 (H) 04/18/2023 0737   BUN 22 04/18/2023 0737   CREATININE 1.02 04/18/2023 0737   CALCIUM 9.3 04/18/2023 0737   PROT 7.2 12/24/2022 0813   ALBUMIN 4.3 12/24/2022 0813   AST 25 12/24/2022 0813   ALT 21 12/24/2022 0813   ALKPHOS 34 (L) 12/24/2022 0813   BILITOT 0.8 12/24/2022 0813   GFRNONAA >60 04/18/2023 0737   GFRAA >60 04/10/2019 4782  GFR Estimated Creatinine Clearance: 67.6 mL/min (by C-G formula based on SCr of 1.02 mg/dL). No results for input(s): "LIPASE", "AMYLASE" in the last 72 hours. No results for input(s): "CKTOTAL", "CKMB", "CKMBINDEX", "TROPONINI" in the last 72 hours. Invalid input(s): "POCBNP" No results for input(s): "DDIMER" in the last 72 hours. Recent Labs    04/18/23 0737  HGBA1C 6.4*   Recent Labs    04/18/23 0737  CHOL 126  HDL 46  LDLCALC 60  TRIG 102  CHOLHDL 2.7   No results for input(s): "TSH", "T4TOTAL", "T3FREE", "THYROIDAB" in the last 72 hours.  Invalid input(s): "FREET3" No results for input(s): "VITAMINB12", "FOLATE", "FERRITIN", "TIBC", "IRON", "RETICCTPCT" in the last 72 hours. Coags: No results for input(s): "INR" in the last 72 hours.  Invalid input(s): "PT" Microbiology: No results found for this or any previous visit (from the past 240 hour(s)).  FURTHER DISCHARGE INSTRUCTIONS:  Get Medicines reviewed and adjusted: Please take all your medications with you for your next visit with your Primary MD  Laboratory/radiological data: Please request your Primary MD to go over all hospital tests and procedure/radiological results at the follow up, please ask your Primary MD to get all Hospital records sent to his/her office.  In some cases, they will be blood work, cultures and biopsy results pending at the time of your discharge. Please request that your primary care M.D. goes through all the records of your hospital data and follows up on these results.  Also Note the  following: If you experience worsening of your admission symptoms, develop shortness of breath, life threatening emergency, suicidal or homicidal thoughts you must seek medical attention immediately by calling 911 or calling your MD immediately  if symptoms less severe.  You must read complete instructions/literature along with all the possible adverse reactions/side effects for all the Medicines you take and that have been prescribed to you. Take any new Medicines after you have completely understood and accpet all the possible adverse reactions/side effects.   Do not drive when taking Pain medications or sleeping medications (Benzodaizepines)  Do not take more than prescribed Pain, Sleep and Anxiety Medications. It is not advisable to combine anxiety,sleep and pain medications without talking with your primary care practitioner  Special Instructions: If you have smoked or chewed Tobacco  in the last 2 yrs please stop smoking, stop any regular Alcohol  and or any Recreational drug use.  Wear Seat belts while driving.  Please note: You were cared for by a hospitalist during your hospital stay. Once you are discharged, your primary care physician will handle any further medical issues. Please note that NO REFILLS for any discharge medications will be authorized once you are discharged, as it is imperative that you return to your primary care physician (or establish a relationship with a primary care physician if you do not have one) for your post hospital discharge needs so that they can reassess your need for medications and monitor your lab values.  Total Time spent coordinating discharge including counseling, education and face to face time equals greater than 30 minutes.  SignedJeoffrey Massed 04/18/2023 4:39 PM

## 2023-04-18 NOTE — Assessment & Plan Note (Signed)
-   He will be placed on supplemental coverage with NovoLog. - We will hold off metformin.

## 2023-04-18 NOTE — Assessment & Plan Note (Signed)
-   We will continue allopurinol 

## 2023-04-18 NOTE — Consult Note (Signed)
Cardiology Consultation   Patient ID: [name] MRN: [id number]; DOB: 1951-04-12  Admit date: 04/17/2023 Date of Consult: 04/18/2023  PCP:  Andreas Blower., MD   [facility] Providers Cardiologist:  Marcina Millard, MD   [facility]  }     Patient Profile:   [name] is a 72 y.o. 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 Jerral Ralph.   History of Present Illness:   [name] follows with [facility] ([place]) for cardiology. He had a remote stress echo 2017 that was normal. He was originally referred to them in 2019 for LBBB identified on pre-op EKG along with incidental pickup of coronary/aortic atherosclerosis. 2D echo at that time showed EF 35-40% with apical akinesis, septal and inferior wall hypocontractility with mild LVH and mild tricuspid regurgitation. Notes indicate Myoview was deferred as patient was asymptomatic and LV dysfunction was felt due to LBBB. Beta blocker was avoided due to bradycardia. Repeat echo 03/2021 showed EF 45-50% septal hypocontractility with mild TR.    He presented to the hospital with AMS.  Patient states that for the past 2 months he has had episodes of dizziness twice a week while dancing primarily.  He is not able to make any associations for when it occurs.  He denies any reccurence during sitting to standing positions. On Saturday, patient reports that 15 minutes before his syncopal episode he had difficulty using his right hand.  He states that it was shaky and he was unable to put lettuce on his burger.  He was dancing with a partner and states that he had a strange aura that he was unable to describe.  Based off encounters from other people, it  was reported that he "melted" and gradually started falling to the ground and landed on his knees.  It was reported that he became unresponsive for about 5 minutes despite having his eyes open.  Once event ended he was back to baseline without any residual deficits. Labs showed BUN 27, Cr 1.24, mild anemia Hgb 12 range (stable).CTA head/neck shows no large vessel occlusion, + moderate right intradural vertebral artery stenosis. Brain MRI was normal for age. 2D echo shows EF 50-55%, dyskinetic apex without evidence of LV thrombus, mild TR, G1DD, aortic sclerosis without stenosis, bubbly study negative. EEG WNL.Orthostatics performed today were negative. Per neuro note, "DDx for presentation includes atypical presentation of syncope, TIA, subclinical seizure and transient encephalopathy due to a toxic or metabolic etiology. Overall impression is that TIA is the most likely etiology for his presentation." Cardiology asked to evaluate for possible syncope.  Patient states that he has had rare occurrences of palpitations.  He denies any complaints of chest pain, shortness of breath, peripheral edema, previous syncopal events, skipping meals, dehydration, illicit drug use, recent medication changes, balance issues.  Past Medical History:  Diagnosis Date   Asthma    Back pain    Diabetes mellitus    diet controlled    Diverticular disease    Environmental allergies    History of diverticulitis of colon 05/17/2018   w/ perforation  s/p  sigmoid colectomy   History of kidney stones    Hyperlipemia    Hypertension  Past Surgical History:  Procedure Laterality Date   ACHILLES TENDON REPAIR Left 1974   ruptured   bilateral cataract surgery      COLON RESECTION N/A 05/17/2018   Procedure: EXPLORATORY LAPAROTOMY SIGMOID COLECTOMY AND  COLOSTOMY;  Surgeon: Darnell Level, MD;  Location: [facility];  Service: General;  Laterality: N/A;   COLONOSCOPY     COLOSTOMY     COLOSTOMY TAKEDOWN N/A 10/25/2018    Procedure: LAPAROSCOPIC COLOSTOMY REVERSAL;  Surgeon: [name], MD;  Location: [facility];  Service: General;  Laterality: N/A;   HERNIA REPAIR     INCISIONAL HERNIA REPAIR N/A 04/14/2019   Procedure: OPEN  REPAIR INCISIONAL HERNIA WITH MESH Patch;  Surgeon: Darnell Level, MD;  Location: [facility];  Service: General;  Laterality: N/A;   KIDNEY STONE SURGERY     KNEE ARTHROSCOPY Right    SHOULDER ARTHROSCOPY W/ ROTATOR CUFF REPAIR Right 12-24-2010    dr sypher @[facility]    SHOULDER ARTHROSCOPY W/ SUBACROMIAL DECOMPRESSION AND DISTAL CLAVICLE EXCISION Left 03-28-2012   dr sypher  @[facility]    debridement   TRIGGER FINGER RELEASE  11/17/2011   Procedure: RELEASE TRIGGER FINGER/A-1 PULLEY;  Surgeon: Wyn Forster., MD;  Location: [facility];  Service: Orthopedics;  Laterality: Left;  RELEASE LEFT LONG TRIGGER FINGER   TUMOR REMOVAL     rt neck/chin     Inpatient Medications: Scheduled Meds:  allopurinol  100 mg Oral Daily   clopidogrel  75 mg Oral Daily   enoxaparin (LOVENOX) injection  40 mg Subcutaneous Q24H   fenofibrate  160 mg Oral QHS   insulin aspart  0-5 Units Subcutaneous QHS   insulin aspart  0-9 Units Subcutaneous TID WC   levETIRAcetam  500 mg Oral BID   lisinopril  40 mg Oral Daily   pantoprazole  40 mg Oral Daily   psyllium  1 packet Oral Daily   rosuvastatin  20 mg Oral QHS   sucralfate  1 g Oral TID WC & HS   Continuous Infusions:  sodium chloride Stopped (04/18/23 0114)   PRN Meds: acetaminophen **OR** acetaminophen, albuterol, ipratropium-albuterol, magnesium hydroxide, methocarbamol, ondansetron **OR** ondansetron (ZOFRAN) IV, oxyCODONE, traZODone  Allergies:    Allergies  Allergen Reactions   Hibiclens [Chlorhexidine Gluconate] Rash and Other (See Comments)    Pt. Had rash on torso after last surgery, mostly on surgical side.  Am listing CHG as potential allergy. Preop 04/14/19 tolerated CHG wipe without problem.   Ciprofloxacin Rash    Took both cipro  and metronidazole together, unsure which caused the rash   Flagyl [Metronidazole] Rash    Took both cipro and metronidazole together, unsure which caused the rash   Nabumetone Rash    Social History:   Social History   Socioeconomic History   Marital status: Divorced    Spouse name: Not on file   Number of children: Not on file   Years of education: Not on file   Highest education level: Not on file  Occupational History   Not on file  Tobacco Use   Smoking status: Former   Smokeless tobacco: Never   Tobacco comments:    never really was a smoker many years ago   Vaping Use   Vaping Use: Never used  Substance and Sexual Activity   Alcohol use: Never   Drug use: No   Sexual activity: Not on file  Other Topics Concern   Not on file  Social History Narrative   Not on file  Social Determinants of Health   Financial Resource Strain: Not on file  Food Insecurity: Not on file  Transportation Needs: Not on file  Physical Activity: Not on file  Stress: Not on file  Social Connections: Not on file  Intimate Partner Violence: Not on file    Family History:   History reviewed. No pertinent family history.   ROS:  Please see the history of present illness.  All other ROS reviewed and negative.     Physical Exam/Data:   Vitals:   04/18/23 1034 04/18/23 1035 04/18/23 1037 04/18/23 1544  BP: 139/66 (!) 143/70 (!) 152/70 118/71  Pulse:      Resp:      Temp:    98.3 F (36.8 C)  TempSrc:    Oral  SpO2:      Weight:      Height:       No intake or output data in the 24 hours ending 04/18/23 1619    04/17/2023    8:59 PM 04/17/2023   12:54 PM 12/24/2022    7:57 AM  Last 3 Weights  Weight (lbs) 182 lb 180 lb 177 lb  Weight (kg) 82.555 kg 81.647 kg 80.287 kg     Body mass index is 30.29 kg/m.  General:  Well nourished, well developed, in no acute distress HEENT: normal Neck: no JVD Vascular: No carotid bruits; Distal pulses 2+ bilaterally Cardiac:  normal S1,  S2; RRR; 2/6 murmur Lungs:  + wheezing Abd: soft, nontender, no hepatomegaly  Ext: no edema Musculoskeletal:  No deformities, BUE and BLE strength normal and equal Skin: warm and dry  Neuro:  CNs 2-12 intact, no focal abnormalities noted Psych:  Normal affect   EKG:  The EKG was personally reviewed and demonstrates: Normal sinus rhythm, heart rate 74.  Left bundle branch block.  Unchanged from prior readings. Telemetry:  Telemetry was personally reviewed and demonstrates: Normal sinus rhythm heart rate 65  Relevant CV Studies: Echocardiogram 04/18/2023  1. Dyskinetic apex without evidence of LV thrombus, best seen on contrast  image clip 90. Left ventricular ejection fraction, by estimation, is 50 to  55%. The left ventricle has low normal function. The left ventricle  demonstrates regional wall motion  abnormalities (see scoring diagram/findings for description). There is  mild concentric left ventricular hypertrophy. Left ventricular diastolic  parameters are consistent with Grade I diastolic dysfunction (impaired  relaxation).   2. Right ventricular systolic function is normal. The right ventricular  size is normal.   3. The mitral valve is normal in structure. No evidence of mitral valve  regurgitation. No evidence of mitral stenosis.   4. The aortic valve is tricuspid. There is mild calcification of the  aortic valve. Aortic valve regurgitation is trivial. Aortic valve  sclerosis is present, with no evidence of aortic valve stenosis.   5. Agitated saline contrast bubble study was negative, with no evidence  of any interatrial shunt.   Laboratory Data:  High Sensitivity Troponin:   Recent Labs  Lab 04/17/23 1404  TROPONINIHS 7     Chemistry Recent Labs  Lab 04/17/23 1335 04/18/23 0737  NA 135 137  K 4.0 4.1  CL 104 105  CO2 21* 23  GLUCOSE 178* 126*  BUN 27* 22  CREATININE 1.24 1.02  CALCIUM 8.8* 9.3  GFRNONAA >60 >60  ANIONGAP 10 9    No results for  input(s): "PROT", "ALBUMIN", "AST", "ALT", "ALKPHOS", "BILITOT" in the last 168 hours. Lipids  Recent Labs  Lab  04/18/23 0737  CHOL 126  TRIG 102  HDL 46  LDLCALC 60  CHOLHDL 2.7    Hematology Recent Labs  Lab 04/17/23 1335 04/18/23 0737  WBC 5.8 6.0  RBC 3.90* 4.04*  HGB 12.4* 12.9*  HCT 35.7* 37.2*  MCV 91.5 92.1  MCH 31.8 31.9  MCHC 34.7 34.7  RDW 13.1 13.0  PLT 274 289   Thyroid No results for input(s): "TSH", "FREET4" in the last 168 hours.  BNPNo results for input(s): "BNP", "PROBNP" in the last 168 hours.  DDimer No results for input(s): "DDIMER" in the last 168 hours.   Radiology/Studies:  ECHOCARDIOGRAM COMPLETE BUBBLE STUDY  Result Date: 04/18/2023    ECHOCARDIOGRAM REPORT   Patient Name:   [name] Date of Exam: 04/18/2023 Medical Rec #:  [id number]  Height:       65.0 in Accession #:    [id number] Weight:       182.0 lb Date of Birth:  [birth year]  BSA:          1.900 m Patient Age:    71 years   BP:           152/70 mmHg Patient Gender: M          HR:           64 bpm. Exam Location:  Inpatient Procedure: 2D Echo, Cardiac Doppler, Color Doppler, Saline Contrast Bubble Study            and Intracardiac Opacification Agent Indications:    Stroke  History:        Patient has prior history of Echocardiogram examinations, most                 recent 03/24/2021. Arrythmias:LBBB; Risk Factors:Hypertension,                 Dyslipidemia and Diabetes.  Sonographer:    Milda Smart Referring Phys: Vernetta Honey MANSY IMPRESSIONS  1. Dyskinetic apex without evidence of LV thrombus, best seen on contrast image clip 90. Left ventricular ejection fraction, by estimation, is 50 to 55%. The left ventricle has low normal function. The left ventricle demonstrates regional wall motion abnormalities (see scoring diagram/findings for description). There is mild concentric left ventricular hypertrophy. Left ventricular diastolic parameters are consistent with Grade I diastolic dysfunction (impaired  relaxation).  2. Right ventricular systolic function is normal. The right ventricular size is normal.  3. The mitral valve is normal in structure. No evidence of mitral valve regurgitation. No evidence of mitral stenosis.  4. The aortic valve is tricuspid. There is mild calcification of the aortic valve. Aortic valve regurgitation is trivial. Aortic valve sclerosis is present, with no evidence of aortic valve stenosis.  5. Agitated saline contrast bubble study was negative, with no evidence of any interatrial shunt. Comparison(s): No prior Echocardiogram. FINDINGS  Left Ventricle: Dyskinetic apex without evidence of LV thrombus, best seen on contrast image clip 90. Left ventricular ejection fraction, by estimation, is 50 to 55%. The left ventricle has low normal function. The left ventricle demonstrates regional wall motion abnormalities. Definity contrast agent was given IV to delineate the left ventricular endocardial borders. The left ventricular internal cavity size was normal in size. There is mild concentric left ventricular hypertrophy. Abnormal (paradoxical) septal motion, consistent with left bundle branch block. Left ventricular diastolic parameters are consistent with Grade I diastolic dysfunction (impaired relaxation).  LV Wall Scoring: The apical septal segment and apex are dyskinetic. Right Ventricle: The right ventricular size is  normal. No increase in right ventricular wall thickness. Right ventricular systolic function is normal. Left Atrium: Left atrial size was normal in size. Right Atrium: Right atrial size was normal in size. Pericardium: There is no evidence of pericardial effusion. Mitral Valve: The mitral valve is normal in structure. No evidence of mitral valve regurgitation. No evidence of mitral valve stenosis. MV peak gradient, 7.4 mmHg. The mean mitral valve gradient is 3.0 mmHg. Tricuspid Valve: The tricuspid valve is normal in structure. Tricuspid valve regurgitation is mild . No  evidence of tricuspid stenosis. Aortic Valve: The aortic valve is tricuspid. There is mild calcification of the aortic valve. Aortic valve regurgitation is trivial. Aortic valve sclerosis is present, with no evidence of aortic valve stenosis. Pulmonic Valve: The pulmonic valve was normal in structure. Pulmonic valve regurgitation is not visualized. No evidence of pulmonic stenosis. Aorta: The aortic root and ascending aorta are structurally normal, with no evidence of dilitation. IAS/Shunts: No atrial level shunt detected by color flow Doppler. Agitated saline contrast was given intravenously to evaluate for intracardiac shunting. Agitated saline contrast bubble study was negative, with no evidence of any interatrial shunt.  LEFT VENTRICLE PLAX 2D LVIDd:         4.20 cm      Diastology LVIDs:         3.10 cm      LV e' medial:    5.33 cm/s LV PW:         1.10 cm      LV E/e' medial:  15.8 LV IVS:        1.20 cm      LV e' lateral:   8.49 cm/s LVOT diam:     2.00 cm      LV E/e' lateral: 9.9 LV SV:         101 LV SV Index:   53 LVOT Area:     3.14 cm  LV Volumes (MOD) LV vol d, MOD A4C: 124.0 ml LV vol s, MOD A4C: 59.5 ml LV SV MOD A4C:     124.0 ml RIGHT VENTRICLE RV S prime:     15.90 cm/s LEFT ATRIUM             Index        RIGHT ATRIUM           Index LA diam:        3.20 cm 1.68 cm/m   RA Area:     15.10 cm LA Vol (A2C):   58.5 ml 30.78 ml/m  RA Volume:   39.60 ml  20.84 ml/m LA Vol (A4C):   36.0 ml 18.94 ml/m LA Biplane Vol: 45.9 ml 24.15 ml/m  AORTIC VALVE LVOT Vmax:   168.00 cm/s LVOT Vmean:  111.000 cm/s LVOT VTI:    0.320 m  AORTA Ao Root diam: 3.30 cm Ao Asc diam:  3.50 cm MITRAL VALVE                TRICUSPID VALVE MV Area (PHT): 3.13 cm     TR Peak grad:   15.8 mmHg MV Area VTI:   3.04 cm     TR Vmax:        199.00 cm/s MV Peak grad:  7.4 mmHg MV Mean grad:  3.0 mmHg     SHUNTS MV Vmax:       1.36 m/s     Systemic VTI:  0.32 m MV Vmean:      87.0 cm/s  Systemic Diam: 2.00 cm MV Decel Time:  242 msec MV E velocity: 84.40 cm/s MV A velocity: 107.00 cm/s MV E/A ratio:  0.79 Riley Lam MD Electronically signed by Riley Lam MD Signature Date/Time: 04/18/2023/12:53:21 PM    Final    EEG adult  Result Date: 04/18/2023 Charlsie Quest, MD     04/18/2023 10:27 AM Patient Name: [name] MRN: [id number] Epilepsy Attending: Charlsie Quest Referring Physician/Provider: Hannah Beat, MD Date: 04/18/2023 Duration: 23.44 mins Patient history:  72 y.o. male with a PMHx of DM, asthma, diverticulitis, HLD and HTN who presented to the ED on Tuesday after having had a syncopal spell on Saturday after which he was observed to be in an awake, unresponsive state for about 5 minutes before spontaneously recovering. He has no prior history of similar events. EEG to evaluate for seizure Level of alertness: Awake, asleep AEDs during EEG study: None Technical aspects: This EEG study was done with scalp electrodes positioned according to the 10-20 International system of electrode placement. Electrical activity was reviewed with band pass filter of 1-70Hz , sensitivity of 7 uV/mm, display speed of 26mm/sec with a 60Hz  notched filter applied as appropriate. EEG data were recorded continuously and digitally stored.  Video monitoring was available and reviewed as appropriate. Description: The posterior dominant rhythm consists of 9-10 Hz activity of moderate voltage (25-35 uV) seen predominantly in posterior head regions, symmetric and reactive to eye opening and eye closing. Sleep was characterized by vertex waves, maximal frontocentral region. Hyperventilation and photic stimulation were not performed.   IMPRESSION: This study is within normal limits. No seizures or epileptiform discharges were seen throughout the recording. A normal interictal EEG does not exclude the diagnosis of epilepsy. Charlsie Quest   MR BRAIN W WO CONTRAST  Result Date: 04/17/2023 CLINICAL DATA:  Follow-up examination for  neuro deficit, stroke/TIA. EXAM: MRI HEAD WITHOUT AND WITH CONTRAST TECHNIQUE: Multiplanar, multiecho pulse sequences of the brain and surrounding structures were obtained without and with intravenous contrast. CONTRAST:  8mL GADAVIST GADOBUTROL 1 MMOL/ML IV SOLN COMPARISON:  Prior study from earlier the same day. FINDINGS: Brain: Cerebral volume within normal limits. Few scattered patchy subcentimeter foci of T2/FLAIR hyperintensity noted involving the supratentorial cerebral white matter, nonspecific, but overall minimal in nature, and less than is typically seen for patient age. No evidence for acute or subacute ischemia. Gray-white matter differentiation maintained. Ors or chronic cortical infarction. No acute or chronic intracranial blood products. No mass lesion, midline shift or mass effect. No hydrocephalus or extra-axial fluid collection. Pituitary gland and suprasellar region within normal limits. No abnormal enhancement. Vascular: Major intracranial vascular flow voids are maintained. Skull and upper cervical spine: Craniocervical junction within normal limits. Bone marrow signal intensity normal. No scalp soft tissue abnormality. Sinuses/Orbits: Prior bilateral ocular lens replacement. Globes and orbital soft tissues demonstrate no acute finding. Few small retention cysts noted about the maxillary sinuses. Mild mucosal thickening about the ethmoidal air cells and maxillary sinuses noted as well. No significant mastoid effusion. Other: None. IMPRESSION: Normal brain MRI for age. No acute intracranial abnormality identified. Electronically Signed   By: Rise Mu M.D.   On: 04/17/2023 23:04   CT Angio Head Neck W WO CM  Result Date: 04/17/2023 CLINICAL DATA:  Neuro deficit, acute, stroke suspected EXAM: CT ANGIOGRAPHY HEAD AND NECK WITH AND WITHOUT CONTRAST TECHNIQUE: Multidetector CT imaging of the head and neck was performed using the standard protocol during bolus administration of  intravenous contrast. Multiplanar CT  image reconstructions and MIPs were obtained to evaluate the vascular anatomy. Carotid stenosis measurements (when applicable) are obtained utilizing NASCET criteria, using the distal internal carotid diameter as the denominator. RADIATION DOSE REDUCTION: This exam was performed according to the departmental dose-optimization program which includes automated exposure control, adjustment of the mA and/or kV according to patient size and/or use of iterative reconstruction technique. CONTRAST:  75mL OMNIPAQUE IOHEXOL 350 MG/ML SOLN COMPARISON:  None Available. FINDINGS: CT HEAD FINDINGS Brain: No evidence of acute infarction, hemorrhage, hydrocephalus, extra-axial collection or mass lesion/mass effect. Vascular: See below. Skull: No acute fracture. Sinuses/Orbits: Largely clear sinuses.  No acute orbital findings. Other: No mastoid effusions. Review of the MIP images confirms the above findings CTA NECK FINDINGS Aortic arch: Great vessel origins patent without significant stenosis. Right carotid system: Atherosclerosis at the carotid bifurcation without greater than 50% stenosis. Left carotid system: Atherosclerosis at the carotid bifurcation without greater than 50% stenosis. Vertebral arteries: Right dominant. No evidence of greater than 50% stenosis. Mild multifocal narrowing bilaterally. Skeleton: No acute abnormality on limited assessment. Other neck: No acute abnormality on limited assessment. Upper chest: Visualized lung apices are clear. Review of the MIP images confirms the above findings CTA HEAD FINDINGS Anterior circulation: Bilateral intracranial ICAs, MCAs, and ACAs are patent without proximal hemodynamically significant stenosis. Posterior circulation: Bilateral posterior cerebral arteries are patent with moderate narrowing of the proximal right intradural vertebral artery. The left intradural vertebral artery is non dominant/small. Basilar artery and bilateral  posterior cerebral arteries are patent without proximal hemodynamically significant stenosis. Venous sinuses: As permitted by contrast timing, patent. Anatomic variants: Detailed above. Review of the MIP images confirms the above findings IMPRESSION: 1. No large vessel occlusion. 2. Moderate right intradural vertebral artery stenosis. Electronically Signed   By: Feliberto Harts M.D.   On: 04/17/2023 15:36     Assessment and Plan:   Syncopal episode (Ddx TIA, seizure) Patient presenting with a single occurrence of syncope in which he was dancing and had blacked out for approximately 5 minutes with his eyes open.  Patient reports having some indescribable preaura prior to this event and some right-handed weakness/tremor; however, has denied any residual symptoms after the fact.  He does state that for the past 2 months he has had episodes of dizziness without any accompanying symptoms.  Generally this has been while dancing and without any association with anything else.  So far workup has been negative and without any clear correlation to a cardiac etiology.  Telemetry negative for any arrhythmias, pauses.  EKG showing normal sinus rhythm with left bundle branch block.  Echocardiogram negative for any valvular disease. Recommend cardiac monitor at discharge for monitoring of any underlying cardiac arrhythmia. However, he is a patient of [facility]. Will discuss with MD about coordination. Neurology following, MRI negative. Neurology has placed patient on Plavix and Keppra  Chronic heart failure with improved ejection fraction Patient with previous LVEF as low as 35 to 40% has had gradual improvement and now currently 50 to 55% with dyskinetic apex without evidence of LV thrombus and regional wall motion abnormalities.  Overall euvolemic on exam and with no interference with ADLs.  Patient is very active and does 100 push-ups a day rows 8000+ meters a day. Patient has had hx of reduced EF, coronary  calcifications, and LBBB. Does not appear to have had an ischemic evaluation, may still benefit from further workup.  Continue lisinopril 40 mg daily Patient intolerant to beta-blocker due to baseline sinus bradycardia.  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 LDL 60. Consider ischemic evaluation at some point.   Othostatic hypotension  Negative orthostatic vital signs.  Risk Assessment/Risk Scores:    For questions or updates, please contact [facility] Please consult [email or web address] for contact info under    Signed, Abagail Kitchens, PA-C  04/18/2023 4:19 PM

## 2023-04-18 NOTE — Assessment & Plan Note (Signed)
-   We will continue antihypertensives with permissive parameters. 

## 2023-04-18 NOTE — Evaluation (Signed)
Occupational Therapy Evaluation and Discharge Patient Details Name: [name] MRN: [id number] DOB: 09/02/1951 Today's Date: 04/18/2023   History of Present Illness 72 y/o M admitted to [facility] on 5/14 for a syncopal episode. MRI of brain negative for acute CVA, CT angio showing moderate right intradural vertebral artery stenosis. EEG WNL. PMHx: HFrEF, HTN, HLD.   Clinical Impression   At baseline PLOF, pt is Independent with all ADLs, IADLs, and functional transfers/mobility without an AD. Pt currently presents at baseline functional level and demonstrates ability to complete all ADLs and functional transfers/mobility without an AD Independently. Pt reports no concerns or questions regarding performing ADLs, IADLs, or functional transfers/mobility post discharge. No additional acute skilled OT services are indicated at this time. No post acute OT follow up is indicated at this time.      Recommendations for follow up therapy are one component of a multi-disciplinary discharge planning process, led by the attending physician.  Recommendations may be updated based on patient status, additional functional criteria and insurance authorization.   Assistance Recommended at Discharge PRN  Patient can return home with the following Other (comment) (PRN assistance with cooking/houswork)    Functional Status Assessment  Patient has not had a recent decline in their functional status  Equipment Recommendations  None recommended by OT    Recommendations for Other Services       Precautions / Restrictions Precautions Precautions: Fall Restrictions Weight Bearing Restrictions: No      Mobility Bed Mobility Overal bed mobility: Independent                  Transfers Overall transfer level: Independent Equipment used: None                      Balance Overall balance assessment: No apparent balance deficits (not formally assessed)                                          ADL either performed or assessed with clinical judgement   ADL Overall ADL's : Independent;At baseline                                             Vision Baseline Vision/History: 1 Wears glasses (Readers) Ability to See in Adequate Light: 0 Adequate Patient Visual Report: No change from baseline Vision Assessment?: Yes;No apparent visual deficits Eye Alignment: Within Functional Limits Ocular Range of Motion: Within Functional Limits Alignment/Gaze Preference: Within Defined Limits Tracking/Visual Pursuits: Able to track stimulus in all quads without difficulty Saccades: Within functional limits Convergence: Within functional limits Visual Fields: No apparent deficits Additional Comments: Pt reports no recent changes in vision.     Perception     Praxis Praxis Praxis tested?: Within functional limits    Pertinent Vitals/Pain Pain Assessment Pain Assessment: No/denies pain     Hand Dominance Right   Extremity/Trunk Assessment Upper Extremity Assessment Upper Extremity Assessment: Overall WFL for tasks assessed   Lower Extremity Assessment Lower Extremity Assessment: Overall WFL for tasks assessed   Cervical / Trunk Assessment Cervical / Trunk Assessment: Normal   Communication Communication Communication: No difficulties   Cognition Arousal/Alertness: Awake/alert Behavior During Therapy: WFL for tasks assessed/performed Overall Cognitive Status: Within Functional Limits for tasks assessed  General Comments: [name]. Pleasant throughout session. [name] who was present during session reports pt has had intermittent memory issues and episodes of "zoning out" over the past few months. No issues noted during OT eval this day.     General Comments  VSS on RA throughout session. [name] present throughout session.    Exercises     Shoulder Instructions      Home Living  Family/patient expects to be discharged to:: Private residence Living Arrangements: Children (Son) Available Help at Discharge: Available PRN/intermittently Type of Home: House Home Access: Stairs to enter Secretary/administrator of Steps: 4 Entrance Stairs-Rails: None Home Layout: Multi-level (3 story house with entrance on second floor and pt's bedroom and bathroom on first floor.) Alternate Level Stairs-Number of Steps: 6 Alternate Level Stairs-Rails: Left Bathroom Shower/Tub: Producer, television/film/video: Standard     Home Equipment: Shower seat - built in          Prior Functioning/Environment Prior Level of Function : Independent/Modified Independent;Driving             Mobility Comments: only fall was the syncopal episode causing admission, independent other wise, enjoys all types of dancing ADLs Comments: At baseline pt is Independent with all ADLs and IADLs and drives.        OT Problem List:        OT Treatment/Interventions:      OT Goals(Current goals can be found in the care plan section) Acute Rehab OT Goals Patient Stated Goal: To return home  OT Frequency:      Co-evaluation              AM-PAC OT "6 Clicks" Daily Activity     Outcome Measure Help from another person eating meals?: None Help from another person taking care of personal grooming?: None Help from another person toileting, which includes using toliet, bedpan, or urinal?: None Help from another person bathing (including washing, rinsing, drying)?: None Help from another person to put on and taking off regular upper body clothing?: None Help from another person to put on and taking off regular lower body clothing?: None 6 Click Score: 24   End of Session Nurse Communication: Mobility status  Activity Tolerance: Patient tolerated treatment well Patient left: in chair;with call bell/phone within reach;with family/visitor present                   Time: 1207-1224 OT Time  Calculation (min): 17 min Charges:  OT General Charges $OT Visit: 1 Visit OT Evaluation $OT Eval Low Complexity: 1 Low  Kallyn Demarcus "Orson Eva., OTR/L, MA Acute Rehab [phone]   Lendon Colonel 04/18/2023, 2:16 PM

## 2023-04-18 NOTE — Care Management (Signed)
  Transition of Care [facility]) Screening Note   Patient Details  Name: [name] Date of Birth: Dec 03, 1951   Transition of Care [facility]) CM/SW Contact:    Lawerance Sabal, RN Phone Number: 04/18/2023, 7:59 AM    Transition of Care [facility]) has reviewed patient and no we will continue to monitor patient advancement through interdisciplinary progression rounds. If new patient transition needs arise, please place a TOC consult.

## 2023-04-19 ENCOUNTER — Telehealth: Payer: Self-pay | Admitting: Cardiology

## 2023-04-19 NOTE — Telephone Encounter (Signed)
Patient was recently discharged yesterday due to concerns of syncopal episodes/seizures.  Cardiology had plans to place patient on cardiac monitor; however, patient was discharged before treatment plan could be discussed.  In addition, patient is also affiliated with [facility].  We would either have patient follow-up with [facility] or transfer care to ourselves to determine who would prescribe the monitor.  Will try to reach out again to patient one more time later today and if unsuccessful will reach out to [facility] to follow-up with patient.

## 2023-04-20 ENCOUNTER — Telehealth: Payer: Self-pay | Admitting: Cardiology

## 2023-04-20 ENCOUNTER — Other Ambulatory Visit (INDEPENDENT_AMBULATORY_CARE_PROVIDER_SITE_OTHER): Payer: Medicare Other

## 2023-04-20 ENCOUNTER — Other Ambulatory Visit: Payer: Self-pay | Admitting: Cardiology

## 2023-04-20 DIAGNOSIS — R55 Syncope and collapse: Secondary | ICD-10-CM

## 2023-04-20 NOTE — Telephone Encounter (Signed)
Patient was recently admitted for concerns of syncope and possible seizure. He was discharged before seen by MD and plans for cardiac monitor were not able to be placed. I have spoken to patient and he has elected to transfer care from [facility] to [place]. Per Dr. Cristal Deer, she would like a live 14 day monitor. I will route this message to Andee Lineman in order to get monitor ordered and then will reach out to office staff to get patient follow up appointment to go over results.

## 2023-04-20 NOTE — Progress Notes (Unsigned)
Enrolled patient for a 14 day Zio AT monitor to be mailed to patients home   Dr Wyline Mood to read

## 2023-04-24 DIAGNOSIS — R55 Syncope and collapse: Secondary | ICD-10-CM | POA: Diagnosis not present

## 2023-05-16 ENCOUNTER — Encounter: Payer: Self-pay | Admitting: Neurology

## 2023-05-16 ENCOUNTER — Ambulatory Visit: Payer: Medicare Other | Admitting: Internal Medicine

## 2023-05-16 ENCOUNTER — Ambulatory Visit (INDEPENDENT_AMBULATORY_CARE_PROVIDER_SITE_OTHER): Payer: Medicare Other | Admitting: Neurology

## 2023-05-16 VITALS — BP 144/73 | HR 68 | Ht 65.0 in | Wt 190.0 lb

## 2023-05-16 DIAGNOSIS — I6501 Occlusion and stenosis of right vertebral artery: Secondary | ICD-10-CM | POA: Diagnosis not present

## 2023-05-16 DIAGNOSIS — E119 Type 2 diabetes mellitus without complications: Secondary | ICD-10-CM | POA: Diagnosis not present

## 2023-05-16 DIAGNOSIS — Z794 Long term (current) use of insulin: Secondary | ICD-10-CM

## 2023-05-16 DIAGNOSIS — R4020 Unspecified coma: Secondary | ICD-10-CM

## 2023-05-16 DIAGNOSIS — I10 Essential (primary) hypertension: Secondary | ICD-10-CM | POA: Diagnosis not present

## 2023-05-16 DIAGNOSIS — E785 Hyperlipidemia, unspecified: Secondary | ICD-10-CM

## 2023-05-16 NOTE — Progress Notes (Signed)
[facility]  PATIENT: [name] DOB: 11/17/1951  REFERRING DOCTOR OR PCP: Marvel Plan, MD; Dennis Bast MD SOURCE: Patient, notes from recent hospitalization, imaging, cardiology, lab reports, imaging studies personally reviewed.  _________________________________   HISTORICAL  CHIEF COMPLAINT:  Chief Complaint  Patient presents with   Hospitalization Follow-up    Rm 10 friend male present, hospital followup regarding TIA & Seizure:stated that keppra makes him exhausted    HISTORY OF PRESENT ILLNESS:  I had the pleasure seeing patient, [name], at [facility] for neurologic consultation regarding his recent episode of loss of consciousness.  He is a 72 year old man with diabetes, hyperlipidemia and hypertension who had an episode of LOC.  In mid May, while dancing with friends 1 evening, he felt lightheaded and then collapsed.  His dance partner noticed that he stopped and then his legs seem to give out.  She gently lowered him to the ground..  The next thing he remembers (about 5 minutes later) was being on the ground with people around him.  He got up by himself and walked back to the table and then left to go to the emergency room.  There was no tonic-clonic activity.  No incontinence.  No weakness, numbness, slurred speech or other neurologic symptoms.  He recalls that just prior to this episode he felt lightheaded.  He has had a few other episodes of lightheadedness without actual syncope.   When those occurred, he sat down and felt fine and minute later.  His friend notes that his hand was shaking a little a few minutes before the episode but there were no other symptoms.  He is diabetic and has been on metformin x many years.  After the episode of loss of consciousness when he went home, before he went to the hospital, his sugar was 160.  He feels his diabetes is fairly well-controlled.  He denies neuropathy/foot  He went to the  [facility] emergency room.  There he had, CT of the head and CT angiogram..  I reviewed these studies.  The  CT of the head showed no acute findings.  Calcification of the proximal right intradural vertebral artery is noted.  The right artery is dominant.  The CT angiogram shows stenosis, in the neighborhood of 40-45% involving the proximal right intradural vertebral artery associated with the calcification and mild stenosis of the more distally.  The left vertebral artery is diminutive..  At the hospital, he had an EEG that was normal.  MRI of the brain was normal for age  ECHO showed LVEF% of 50-55% and the apex was hypokinetic.   No PFO.    He has a longstanding LBBB on EKG ad has seen cardiology.  He reports treadmill test was fine.  Upon discharge a 14-day long-term cardiac monitor was placed.  He has just turned the monitor and but there are no results yet  Imaging studies reviewed MRI brain 04/17/2023 was normal for age with just a couple T2/FLAIR hypertense foci consistent with minimal age-appropriate chronic microvascular ischemic change.  No acute findings.  CT angiogram of the head and neck 04/17/2023 showed calcification in the right intradural vertebral artery.  There appeared to be moderate stenosis of this dominant vertebral artery (1 mm axial source image #[id number])-.  The left vertebral artery had no stenosis but was diminutive, a normal variant   REVIEW OF SYSTEMS: Constitutional: No fevers, chills, sweats, or change in appetite Eyes: No visual changes, double  vision, eye pain Ear, nose and throat: No hearing loss, ear pain, nasal congestion, sore throat Cardiovascular: No chest pain, palpitations Respiratory:  No shortness of breath at rest or with exertion.   No wheezes GastrointestinaI: No nausea, vomiting, diarrhea, abdominal pain, fecal incontinence.  Has had colon resection. Genitourinary:  No dysuria, urinary retention or frequency.  No nocturia. Musculoskeletal:  No  neck pain, back pain Integumentary: No rash, pruritus, skin lesions Neurological: as above Psychiatric: No depression at this time.  No anxiety Endocrine: He has non-insulin-dependent diabetes mellitus.  No palpitations, diaphoresis, change in appetite, change in weigh or increased thirst Hematologic/Lymphatic:  No anemia, purpura, petechiae. Allergic/Immunologic: No itchy/runny eyes, nasal congestion, recent allergic reactions, rashes  ALLERGIES: Allergies  Allergen Reactions   Chlorhexidine Gluconate Rash    Pt. Had rash on torso after last surgery, mostly on surgical side.   Hibiclens [Chlorhexidine Gluconate] Rash and Other (See Comments)    Pt. Had rash on torso after last surgery, mostly on surgical side.  Am listing CHG as potential allergy. Preop 04/14/19 tolerated CHG wipe without problem.   Metronidazole Rash and Hives    Took both cipro and metronidazole together, unsure which caused the rash  Other Reaction(s): Other (See Comments)  Unknown Reaction    Took both cipro and metronidazole together, unsure which caused the rash   Amoxicillin-Pot Clavulanate Hives    Patient denies allergy   Ciprofloxacin Rash    Took both cipro and metronidazole together, unsure which caused the rash  Other Reaction(s): Other (See Comments)  Unknown Reaction   Nabumetone Rash    HOME MEDICATIONS:  Current Outpatient Medications:    albuterol (PROVENTIL HFA;VENTOLIN HFA) 108 (90 BASE) MCG/ACT inhaler, Inhale 2 puffs into the lungs every 6 (six) hours as needed for wheezing. , Disp: , Rfl:    allopurinol (ZYLOPRIM) 100 MG tablet, Take 100 mg by mouth daily., Disp: , Rfl:    allopurinol (ZYLOPRIM) 300 MG tablet, Take 300 mg by mouth daily., Disp: , Rfl:    amLODipine (NORVASC) 10 MG tablet, Take 10 mg by mouth daily., Disp: , Rfl:    Ascorbic Acid (VITAMIN C) 1000 MG tablet, Take 1,000 mg by mouth daily., Disp: , Rfl:    CINNAMON PO, Take 1,000 mg by mouth daily., Disp: , Rfl:     clopidogrel (PLAVIX) 75 MG tablet, Take 1 tablet (75 mg total) by mouth daily., Disp: 30 tablet, Rfl: 1   diclofenac Sodium (VOLTAREN) 1 % GEL, Apply 2 g topically 4 (four) times daily., Disp: 50 g, Rfl: 0   doxazosin (CARDURA) 1 MG tablet, Take 1 mg by mouth daily., Disp: , Rfl:    fenofibrate 160 MG tablet, Take 160 mg by mouth at bedtime. , Disp: , Rfl:    levETIRAcetam (KEPPRA) 500 MG tablet, Take 1 tablet (500 mg total) by mouth 2 (two) times daily., Disp: 60 tablet, Rfl: 1   lisinopril (PRINIVIL,ZESTRIL) 40 MG tablet, Take 40 mg by mouth daily. , Disp: , Rfl: 3   metFORMIN (GLUCOPHAGE) 500 MG tablet, Take 500 mg by mouth in the morning and at bedtime., Disp: , Rfl:    Multiple Vitamin (MULTIVITAMIN WITH MINERALS) TABS tablet, Take 1 tablet by mouth daily., Disp: , Rfl:    pantoprazole (PROTONIX) 20 MG tablet, Take 1 tablet (20 mg total) by mouth daily., Disp: 21 tablet, Rfl: 0   polyvinyl alcohol (ARTIFICIAL TEARS) 1.4 % ophthalmic solution, Place 1 drop into both eyes 2 (two) times a day., Disp: ,  Rfl:    psyllium (METAMUCIL) 58.6 % packet, Take 1 packet by mouth at bedtime. , Disp: , Rfl:    rosuvastatin (CRESTOR) 40 MG tablet, Take 20 mg by mouth at bedtime. , Disp: , Rfl:    tadalafil (CIALIS) 20 MG tablet, Take 20 mg by mouth daily as needed for erectile dysfunction., Disp: , Rfl:   PAST MEDICAL HISTORY: Past Medical History:  Diagnosis Date   Asthma    Back pain    Diabetes mellitus    diet controlled    Diverticular disease    Environmental allergies    History of diverticulitis of colon 05/17/2018   w/ perforation  s/p  sigmoid colectomy   History of kidney stones    Hyperlipemia    Hypertension     PAST SURGICAL HISTORY: Past Surgical History:  Procedure Laterality Date   ACHILLES TENDON REPAIR Left 1974   ruptured   bilateral cataract surgery      COLON RESECTION N/A 05/17/2018   Procedure: EXPLORATORY LAPAROTOMY SIGMOID COLECTOMY AND  COLOSTOMY;  Surgeon: Darnell Level, MD;  Location: [facility];  Service: General;  Laterality: N/A;   COLONOSCOPY     COLOSTOMY     COLOSTOMY TAKEDOWN N/A 10/25/2018   Procedure: LAPAROSCOPIC COLOSTOMY REVERSAL;  Surgeon: [name], MD;  Location: [facility];  Service: General;  Laterality: N/A;   HERNIA REPAIR     INCISIONAL HERNIA REPAIR N/A 04/14/2019   Procedure: OPEN  REPAIR INCISIONAL HERNIA WITH MESH Patch;  Surgeon: Darnell Level, MD;  Location: [facility];  Service: General;  Laterality: N/A;   KIDNEY STONE SURGERY     KNEE ARTHROSCOPY Right    SHOULDER ARTHROSCOPY W/ ROTATOR CUFF REPAIR Right 12-24-2010    dr sypher @[facility]    SHOULDER ARTHROSCOPY W/ SUBACROMIAL DECOMPRESSION AND DISTAL CLAVICLE EXCISION Left 03-28-2012   dr sypher  @[facility]    debridement   TRIGGER FINGER RELEASE  11/17/2011   Procedure: RELEASE TRIGGER FINGER/A-1 PULLEY;  Surgeon: Wyn Forster., MD;  Location: [facility];  Service: Orthopedics;  Laterality: Left;  RELEASE LEFT LONG TRIGGER FINGER   TUMOR REMOVAL     rt neck/chin    FAMILY HISTORY: History reviewed. No pertinent family history.  SOCIAL HISTORY: Social History   Socioeconomic History   Marital status: Divorced    Spouse name: Not on file   Number of children: 2   Years of education: Not on file   Highest education level: Not on file  Occupational History   Not on file  Tobacco Use   Smoking status: Former   Smokeless tobacco: Never   Tobacco comments:    never really was a smoker many years ago   Vaping Use   Vaping Use: Never used  Substance and Sexual Activity   Alcohol use: Never   Drug use: No   Sexual activity: Not on file  Other Topics Concern   Not on file  Social History Narrative   Not on file   Social Determinants of Health   Financial Resource Strain: Not on file  Food Insecurity: Not on file  Transportation Needs: Not on file  Physical Activity: Not on file  Stress: Not on file  Social Connections: Not on file  Intimate Partner  Violence: Not on file       PHYSICAL EXAM  Vitals:   05/16/23 1541 05/16/23 1546  BP: (!) 150/62 (!) 144/73  Pulse: 68   Weight: 190 lb (86.2 kg)   Height: 5'  5" (1.651 m)     Body mass index is 31.62 kg/m.   Supine BP   160/75   72 Sitting          160/75   72 Stand 30  s   160/ 75  72 Stand 120 s    160/70  76  General: The patient is well-developed and well-nourished and in no acute distress  HEENT:  Head is [place]/AT.  Sclera are anicteric.    Neck: No carotid bruits are noted.  The neck is nontender.  Cardiovascular: The heart has a regular rate and rhythm with a normal S1 and S2. There is a 2/6 systolic murmur.  Skin: Extremities are without rash or  edema.  Musculoskeletal:  Back is nontender  Neurologic Exam  Mental status: The patient is alert and oriented x 3 at the time of the examination. The patient has apparent normal recent and remote memory, with an apparently normal attention span and concentration ability.   Speech is normal.  Cranial nerves: Extraocular movements are full. Pupils are equal, round, and reactive to light and accomodation.  Visual fields are full.  Facial symmetry is present. There is good facial sensation to soft touch bilaterally.Facial strength is normal.  Trapezius and sternocleidomastoid strength is normal. No dysarthria is noted.  The tongue is midline, and the patient has symmetric elevation of the soft palate. No obvious hearing deficits are noted.  Motor:  Muscle bulk is normal.   Tone is normal. Strength is  5 / 5 in all 4 extremities.   Sensory: Sensory testing is intact to pinprick, soft touch and vibration sensation in all 4 extremities including toes.     Coordination: Cerebellar testing reveals good finger-nose-finger and heel-to-shin bilaterally.  Gait and station: Station is normal.   Gait is normal. Tandem gait is normal. Romberg is negative.   Reflexes: Deep tendon reflexes are symmetric and normal bilaterally.    Plantar responses are flexor.    DIAGNOSTIC DATA (LABS, IMAGING, TESTING) - I reviewed patient records, labs, notes, testing and imaging myself where available.  Lab Results  Component Value Date   WBC 6.0 04/18/2023   HGB 12.9 (L) 04/18/2023   HCT 37.2 (L) 04/18/2023   MCV 92.1 04/18/2023   PLT 289 04/18/2023      Component Value Date/Time   NA 137 04/18/2023 0737   K 4.1 04/18/2023 0737   CL 105 04/18/2023 0737   CO2 23 04/18/2023 0737   GLUCOSE 126 (H) 04/18/2023 0737   BUN 22 04/18/2023 0737   CREATININE 1.02 04/18/2023 0737   CALCIUM 9.3 04/18/2023 0737   PROT 7.2 12/24/2022 0813   ALBUMIN 4.3 12/24/2022 0813   AST 25 12/24/2022 0813   ALT 21 12/24/2022 0813   ALKPHOS 34 (L) 12/24/2022 0813   BILITOT 0.8 12/24/2022 0813   GFRNONAA >60 04/18/2023 0737   GFRAA >60 04/10/2019 0826   Lab Results  Component Value Date   CHOL 126 04/18/2023   HDL 46 04/18/2023   LDLCALC 60 04/18/2023   TRIG 102 04/18/2023   CHOLHDL 2.7 04/18/2023   Lab Results  Component Value Date   HGBA1C 6.4 (H) 04/18/2023       ASSESSMENT AND PLAN  Loss of consciousness (HCC)  Stenosis of right vertebral artery  Essential hypertension  Type 2 diabetes mellitus without complication, unspecified whether long term insulin use (HCC)  Hyperlipidemia, unspecified hyperlipidemia type   In summary, [name] is a 72 year old man with a single episode  of loss of consciousness that occurred while dancing.  Before the LOC, he had lightheadedness.  He has had other spells of lightheadedness lasting under a minute. I believe the event was syncope and not seizure due to the preceding lightheadedness and lack of clinical seizure activity.  The length of loss of consciousness is unusual for syncope but he did have a very rapid return to baseline which would be more typical, compared to seizure.  TIA is also less likely as no neurologic symptoms.  He does have vertebral artery stenosis on the right  but it is uncertain if this could have played any role.  The cardiac monitor is pending.  Because I think the likelihood of seizure is small.  He will stop the Keppra tapering over a few days.  He will continue Plavix and Crestor.  He will be seeing cardiology and telemetry results are pending.  We did discuss that if he has a couple more episodes of lightheadedness and 1 episode of loss of consciousness that I may want him to get a conventional cerebral angiogram to better characterize the degree of stenosis.  As he has a very diminutive left vertebral artery, neck position combined with the stenosis could also lead to reduced flow even though moderate stenosis is typically not hemodynamically significant  He will return to see me as needed if he has new or worsening symptoms.  Thank you for asking me to see [name].  Please let me know if I will be of further assistance with MRI of the patients in the future.   Frederico Gerling A. Epimenio Foot, MD, [facility] 05/16/2023, 6:34 PM Certified in Neurology, Clinical Neurophysiology, Sleep Medicine and Neuroimaging  [facility] [address] [place], [place] [address] [phone]

## 2023-05-24 ENCOUNTER — Encounter: Payer: Self-pay | Admitting: Neurology

## 2023-06-14 ENCOUNTER — Telehealth: Payer: Self-pay | Admitting: Cardiology

## 2023-06-14 NOTE — Telephone Encounter (Signed)
*  STAT* If patient is at the pharmacy, call can be transferred to refill team.   1. Which medications need to be refilled? (please list name of each medication and dose if known) clopidogrel (PLAVIX) 75 MG tablet   2. Which pharmacy/location (including street and city if local pharmacy) is medication to be sent to? [id number] - [place], [place] - [address]   3. Do they need a 30 day or 90 day supply? 90 Day Supply

## 2023-06-15 ENCOUNTER — Other Ambulatory Visit: Payer: Self-pay | Admitting: Cardiology

## 2023-06-15 MED ORDER — CLOPIDOGREL BISULFATE 75 MG PO TABS: 75.0000 mg | ORAL_TABLET | Freq: Every day | ORAL | 1 refills | Status: DC

## 2023-06-15 NOTE — Telephone Encounter (Signed)
Rx request sent to pharmacy.  

## 2023-07-03 ENCOUNTER — Inpatient Hospital Stay: Payer: Medicare Other | Admitting: Neurology

## 2023-07-20 ENCOUNTER — Ambulatory Visit (HOSPITAL_BASED_OUTPATIENT_CLINIC_OR_DEPARTMENT_OTHER): Payer: Medicare Other | Admitting: Family

## 2023-07-20 ENCOUNTER — Encounter (HOSPITAL_BASED_OUTPATIENT_CLINIC_OR_DEPARTMENT_OTHER): Payer: Self-pay | Admitting: Cardiology

## 2023-07-20 ENCOUNTER — Telehealth (HOSPITAL_BASED_OUTPATIENT_CLINIC_OR_DEPARTMENT_OTHER): Payer: Self-pay | Admitting: Family

## 2023-07-20 ENCOUNTER — Ambulatory Visit (HOSPITAL_BASED_OUTPATIENT_CLINIC_OR_DEPARTMENT_OTHER): Payer: Medicare Other | Admitting: Cardiology

## 2023-07-20 ENCOUNTER — Ambulatory Visit (INDEPENDENT_AMBULATORY_CARE_PROVIDER_SITE_OTHER): Payer: Medicare Other | Admitting: Cardiology

## 2023-07-20 VITALS — BP 136/76 | HR 68 | Ht 65.0 in | Wt 187.0 lb

## 2023-07-20 DIAGNOSIS — E119 Type 2 diabetes mellitus without complications: Secondary | ICD-10-CM

## 2023-07-20 DIAGNOSIS — I10 Essential (primary) hypertension: Secondary | ICD-10-CM | POA: Diagnosis not present

## 2023-07-20 DIAGNOSIS — I5042 Chronic combined systolic (congestive) and diastolic (congestive) heart failure: Secondary | ICD-10-CM

## 2023-07-20 DIAGNOSIS — Z7984 Long term (current) use of oral hypoglycemic drugs: Secondary | ICD-10-CM

## 2023-07-20 DIAGNOSIS — R55 Syncope and collapse: Secondary | ICD-10-CM | POA: Diagnosis not present

## 2023-07-20 DIAGNOSIS — E785 Hyperlipidemia, unspecified: Secondary | ICD-10-CM

## 2023-07-20 MED ORDER — VALSARTAN 160 MG PO TABS
160.0000 mg | ORAL_TABLET | Freq: Every day | ORAL | 11 refills | Status: DC
Start: 2023-07-20 — End: 2024-06-03

## 2023-07-20 NOTE — Progress Notes (Signed)
Cardiology Office Note:  .    Date:  07/20/2023  ID:  [name], DOB 09-Apr-1951, MRN [id number] PCP: Andreas Blower., MD  [facility] Providers Cardiologist:  Jodelle Red, MD     History of Present Illness: .    [name] is a 72 y.o. male with a hx of HFrEF, TIA, hypertension, hyperlipidemia, type 2 diabetes, gout, obesity, here for the evaluation of syncope and discussion of heart monitor results.  He was admitted to the hospital 04/17/2023 following a syncopal episode. Evaluated by neurology and cardiology; noted suspicions of seizures (left arm tremors and postictal confusion). Recommended to start Keppra and continue Plavix. Arranged for outpatient heart monitor given complaints of dizzy spells. He has followed up with neurology 05/2023 with Dr. Epimenio Foot. Likelihood of seizure felt to be small, so Keppra was discontinued. Continued Plavix and Crestor.   Today he is accompanied by his wife.  Syncope: Initial episode: Prompting his hospital admittance 04/17/2023. He was with his wife at an event. Initially while at a buffet, he noticed having difficulty preparing a hamburger. After eating, they were dancing when after 3 minutes of dancing he suddenly fell to his knees slowly. His wife describes this as he was "melting into the floor." He stayed on his knees for about 5-7 minutes, and his eyes never closed. He was unresponsive and never fell backwards. Frequency: Isolated episode. No further episodes. Duration of episode: About 5-7 minutes of unresponsiveness.  Presyncopal symptoms: Difficulty with using tongs to prepare hamburger. Slowly fell to his knees. Post syncope symptoms: Got up and went back to the table. Had felt well enough to dance again or drive home. At home they measured his glucose in the 200s, blood pressure was also elevated. Over the next few days he was acting fatigued.  Aggravating/alleviating factors: They note that he is known to have occasional dizzy  spells causing him to sit down. Usually attributed to his diet or his medications as he is diabetic. Pre-existing medical conditions: HFrEF, hypertension - he has had some labile blood pressures at home up to 160s systolic (does not routinely monitor), hyperlipidemia, diabetes Potential medication/supplement interactions: He notes that his metformin had been doubled. Prior workup: He wore a monitor 5-05/2023 with predominantly sinus rhythm. First Degree AV Block was present. Bundle Branch Block/IVCD was present. There were 2 runs of SVT, longest lasting 5 beats. Isolated ectopy was rare (<1.0%). He had an echo 04/2023 revealing LVEF 50-55%, dyskinetic apex without evidence of LV thrombus, mild LVH, grade 1 diastolic dysfunction. Trivial aortic valve regurgitation. Negative bubble study. Stress echo 2017 reportedly normal, Echo 2019 with reduced EF. He denies any prior heart catheterization. ECG:  Known LBBB. Family history: Not discussed. Exercise level: Uses rower machine every day, no exertional symptoms.  He denies any palpitations, chest pain, shortness of breath, peripheral edema, headaches, orthopnea, or PND.  ROS:  Please see the history of present illness. ROS otherwise negative except as noted.  (+) Isolated syncopal episode (+) Dizzy spells (+) Fatigue  Studies Reviewed: .         Cardiac Event Monitor 05/2023: 1 triggered event for sinus rhythm. No significant tachyarrhythmia or bradyarrhythmia. No atrial fibrillation or flutter.  Patch Wear Time:  14 days and 0 hours (2024-05-21T17:28:16-0400 to 2024-06-04T17:28:16-0400)   Patient had a min HR of 46 bpm, max HR of 109 bpm, and avg HR of 63 bpm. Predominant underlying rhythm was Sinus Rhythm. First Degree AV Block was present. Bundle Branch Block/IVCD was  present. 2 Supraventricular Tachycardia runs occurred, the run with  the fastest interval lasting 4 beats with a max rate of 109 bpm, the longest lasting 5 beats with an avg rate of  97 bpm. Isolated SVEs were rare (<1.0%), SVE Couplets were rare (<1.0%), and SVE Triplets were rare (<1.0%). Isolated VEs were rare (<1.0%),  and no VE Couplets or VE Triplets were present.  Echo 04/18/2023:  1. Dyskinetic apex without evidence of LV thrombus, best seen on contrast  image clip 90. Left ventricular ejection fraction, by estimation, is 50 to  55%. The left ventricle has low normal function. The left ventricle  demonstrates regional wall motion  abnormalities (see scoring diagram/findings for description). There is  mild concentric left ventricular hypertrophy. Left ventricular diastolic  parameters are consistent with Grade I diastolic dysfunction (impaired  relaxation).   2. Right ventricular systolic function is normal. The right ventricular  size is normal.   3. The mitral valve is normal in structure. No evidence of mitral valve  regurgitation. No evidence of mitral stenosis.   4. The aortic valve is tricuspid. There is mild calcification of the  aortic valve. Aortic valve regurgitation is trivial. Aortic valve  sclerosis is present, with no evidence of aortic valve stenosis.   5. Agitated saline contrast bubble study was negative, with no evidence  of any interatrial shunt.   Comparison(s): No prior Echocardiogram.   Physical Exam:    VS:  BP 136/76   Pulse 68   Ht 5\' 5"  (1.651 m)   Wt 187 lb (84.8 kg)   SpO2 98%   BMI 31.12 kg/m    Wt Readings from Last 3 Encounters:  07/20/23 187 lb (84.8 kg)  05/16/23 190 lb (86.2 kg)  04/17/23 182 lb (82.6 kg)    GEN: Well nourished, well developed in no acute distress HEENT: Normal, moist mucous membranes NECK: No JVD CARDIAC: regular rhythm, normal S1 and S2, no rubs or gallops. No murmur. VASCULAR: Radial and DP pulses 2+ bilaterally. No carotid bruits RESPIRATORY:  Clear to auscultation without rales, wheezing or rhonchi  ABDOMEN: Soft, non-tender, non-distended MUSCULOSKELETAL:  Ambulates independently SKIN:  Warm and dry, no edema NEUROLOGIC:  Alert and oriented x 3. No focal neuro deficits noted. PSYCHIATRIC:  Normal affect   ASSESSMENT AND PLAN: .    Syncope/unclear reduced level of consciousness -atypical pattern for syncope. Reports gradual neuro symptoms prior, then during several minutes of unresponsiveness, did not completely lose muscle tone.  -echo, monitor without findings that could be etiology of syncope -CT angio neck with moderate right intradural vertebral artery stenosis -EEG without seizure -has been seen by neurology. Recommended Plavix during hospitalization. From cardiac standpoint, ok for aspirin instead of clopidogrel, but will defer to neurology  History of reduced LVEF LBBB Previously seen by Dr. Darrold Junker, last in 10/2021. Noted to have history of incidental coronary atherosclerosis on CT, LBBB. Echo in 2019 showed EF 35-40%. Stress echo in 2017 was normal at rest and with stress.  He has not had cardiac cath. EF has improved. We discussed GDMT today. After shared decision making, will change lisinopril to valsartan. If well tolerated at follow up, would change to entresto. Would also discuss SGLT2i given diabetes (as below).  Hypertension -on amlodipine, doxazosin -changing lisinopril to valsartan as above -would decrease amlodipine if more BP room needed for GDMT  Type II diabetes Hyperlipidemia -currently on metformin, rosuvastatin, fenofibrates -would benefit from SLGT2i given history of cardiomyopathy -reports side effects on metformin -last  LDL 60, TG 102  Dispo: Follow-up in 2 months, or sooner as needed.  Total time of encounter: 44 minutes total time of encounter, including 28 minutes spent in face-to-face patient care. This time includes coordination of care and counseling regarding above conditions. Remainder of non-face-to-face time involved reviewing chart documents/testing relevant to the patient encounter and documentation in the medical  record.  Jodelle Red, MD, PhD, [facility]  [facility]    I,Mathew Stumpf,acting as a scribe for Jodelle Red, MD.,have documented all relevant documentation on the behalf of Jodelle Red, MD,as directed by  Jodelle Red, MD while in the presence of Jodelle Red, MD.  I, Jodelle Red, MD, have reviewed all documentation for this visit. The documentation on 07/20/23 for the exam, diagnosis, procedures, and orders are all accurate and complete.   Signed, Jodelle Red, MD

## 2023-07-20 NOTE — Patient Instructions (Addendum)
Medication Instructions:  STOP- Lisinopril START- Valsartan 160 mg by mouth daily  *If you need a refill on your cardiac medications before your next appointment, please call your pharmacy*   Lab Work: BMP in 3 weeks  If you have labs (blood work) drawn today and your tests are completely normal, you will receive your results only by: MyChart Message (if you have MyChart) OR A paper copy in the mail If you have any lab test that is abnormal or we need to change your treatment, we will call you to review the results.   Testing/Procedures: None Ordered   Follow-Up: At [facility], you and your health needs are our priority.  As part of our continuing mission to provide you with exceptional heart care, we have created designated Provider Care Teams.  These Care Teams include your primary Cardiologist (physician) and Advanced Practice Providers (APPs -  Physician Assistants and Nurse Practitioners) who all work together to provide you with the care you need, when you need it.  We recommend signing up for the patient portal called "MyChart".  Sign up information is provided on this After Visit Summary.  MyChart is used to connect with patients for Virtual Visits (Telemedicine).  Patients are able to view lab/test results, encounter notes, upcoming appointments, etc.  Non-urgent messages can be sent to your provider as well.   To learn more about what you can do with MyChart, go to [email or web address].    Your next appointment:   2 month(s)  Provider:   Jodelle Red, MD    Other Instructions  We are going to change from lisinopril to Valsartan, in anticipation of changing to entresto down the road.  how to check blood pressure:  -sit comfortably in a chair, feet uncrossed and flat on floor, for 5-10 minutes  -arm ideally should rest at the level of the heart. However, arm should be relaxed and not tense (for example, do not hold the arm up unsupported)  -avoid  exercise, caffeine, and tobacco for at least 30 minutes prior to BP reading  -don't take BP cuff reading over clothes (always place on skin directly)  -I prefer to know how well the medication is working, so I would like you to take your readings 1-2 hours after taking your blood pressure medication if possible

## 2023-07-27 ENCOUNTER — Ambulatory Visit (HOSPITAL_BASED_OUTPATIENT_CLINIC_OR_DEPARTMENT_OTHER): Payer: Medicare Other | Admitting: Cardiology

## 2023-08-13 NOTE — Telephone Encounter (Signed)
Error

## 2023-09-11 ENCOUNTER — Other Ambulatory Visit: Payer: Self-pay | Admitting: Cardiology

## 2023-09-27 ENCOUNTER — Encounter (HOSPITAL_BASED_OUTPATIENT_CLINIC_OR_DEPARTMENT_OTHER): Payer: Self-pay | Admitting: Cardiology

## 2023-09-27 ENCOUNTER — Other Ambulatory Visit (HOSPITAL_COMMUNITY): Payer: Self-pay

## 2023-09-27 ENCOUNTER — Ambulatory Visit (INDEPENDENT_AMBULATORY_CARE_PROVIDER_SITE_OTHER): Payer: Medicare Other | Admitting: Cardiology

## 2023-09-27 ENCOUNTER — Telehealth (HOSPITAL_BASED_OUTPATIENT_CLINIC_OR_DEPARTMENT_OTHER): Payer: Self-pay

## 2023-09-27 VITALS — BP 146/74 | HR 62 | Ht 65.0 in | Wt 186.8 lb

## 2023-09-27 DIAGNOSIS — I5042 Chronic combined systolic (congestive) and diastolic (congestive) heart failure: Secondary | ICD-10-CM

## 2023-09-27 DIAGNOSIS — E119 Type 2 diabetes mellitus without complications: Secondary | ICD-10-CM | POA: Diagnosis not present

## 2023-09-27 DIAGNOSIS — E7800 Pure hypercholesterolemia, unspecified: Secondary | ICD-10-CM

## 2023-09-27 DIAGNOSIS — I10 Essential (primary) hypertension: Secondary | ICD-10-CM

## 2023-09-27 MED ORDER — DOXAZOSIN MESYLATE 2 MG PO TABS: 2.0000 mg | ORAL_TABLET | Freq: Every day | ORAL | 3 refills | Status: DC

## 2023-09-27 MED ORDER — SEMAGLUTIDE(0.25 OR 0.5MG/DOS) 2 MG/3ML ~~LOC~~ SOPN: PEN_INJECTOR | SUBCUTANEOUS | 3 refills | Status: AC

## 2023-09-27 MED ORDER — SEMAGLUTIDE(0.25 OR 0.5MG/DOS) 2 MG/3ML ~~LOC~~ SOPN: 0.2500 mg | PEN_INJECTOR | SUBCUTANEOUS | Status: DC

## 2023-09-27 MED ORDER — ASPIRIN 81 MG PO TBEC
81.0000 mg | DELAYED_RELEASE_TABLET | Freq: Every day | ORAL | Status: AC
Start: 2023-09-27 — End: ?

## 2023-09-27 NOTE — Patient Instructions (Addendum)
Semaglutide starting dose plan: -Start with the 0.25 mg dose every week for 4 weeks, -Then use the 0.5 mg dose every week until you come for follow up.  STOP METFORMIN   Some nausea is normal, as is decreased appetite. If you have severe nausea or stomach pain, please contact us and stop the medication.   We will increase the doxazosin to 2 mg every night. See if this helps your AM blood pressures.   Stop clopidogrel, start aspirin 81 mg.  Follow up 12/31/2023 10:20 am with Dr Cristal Deer

## 2023-09-27 NOTE — Telephone Encounter (Signed)
Patient started on ozempic today, will start on 0.5mg  at pharmcy, 0.25mg  sample pack given in clinic. For diabetes

## 2023-09-27 NOTE — Progress Notes (Signed)
Cardiology Office Note:  .    Date:  09/27/2023  ID:  [name], DOB [birth year], MRN [id number] PCP: Andreas Blower., MD  [facility] Providers Cardiologist:  Jodelle Red, MD     History of Present Illness: .    [name] is a 72 y.o. male wwith a hx of HFrEF, TIA, hypertension, hyperlipidemia, type 2 diabetes, gout, obesity, here for follow-up today. He was initially seen 07/20/2023 for the evaluation of syncope and discussion of heart monitor results.   He was admitted to the hospital 04/17/2023 following a syncopal episode. Evaluated by neurology and cardiology; noted suspicions of seizures (left arm tremors and postictal confusion). Recommended to start Keppra and continue Plavix. Arranged for outpatient heart monitor given complaints of dizzy spells. He has followed up with neurology 05/2023 with Dr. Epimenio Foot. Likelihood of seizure felt to be small, so Keppra was discontinued. Continued Plavix and Crestor.    Syncope: Initial episode: Prompting his hospital admittance 04/17/2023. He was with his wife at an event. Initially while at a buffet, he noticed having difficulty preparing a hamburger. After eating, they were dancing when after 3 minutes of dancing he suddenly fell to his knees slowly. His wife describes this as he was "melting into the floor." He stayed on his knees for about 5-7 minutes, and his eyes never closed. He was unresponsive and never fell backwards. Frequency: Isolated episode. No further episodes. Duration of episode: About 5-7 minutes of unresponsiveness.  Presyncopal symptoms: Difficulty with using tongs to prepare hamburger. Slowly fell to his knees. Post syncope symptoms: Got up and went back to the table. Had felt well enough to dance again or drive home. At home they measured his glucose in the 200s, blood pressure was also elevated. Over the next few days he was acting fatigued.  Aggravating/alleviating factors: They note that he is known to have  occasional dizzy spells causing him to sit down. Usually attributed to his diet or his medications as he is diabetic. Pre-existing medical conditions: HFrEF, hypertension - he has had some labile blood pressures at home up to 160s systolic (does not routinely monitor), hyperlipidemia, diabetes Potential medication/supplement interactions: He notes that his metformin had been doubled. Prior workup: He wore a monitor 5-05/2023 with predominantly sinus rhythm. First Degree AV Block was present. Bundle Branch Block/IVCD was present. There were 2 runs of SVT, longest lasting 5 beats. Isolated ectopy was rare (<1.0%). He had an echo 04/2023 revealing LVEF 50-55%, dyskinetic apex without evidence of LV thrombus, mild LVH, grade 1 diastolic dysfunction. Trivial aortic valve regurgitation. Negative bubble study. Stress echo 2017 reportedly normal, Echo 2019 with reduced EF. He denies any prior heart catheterization. ECG:  Known LBBB. Family history: Not discussed. Exercise level: Uses rower machine every day, no exertional symptoms.  At his initial visit, after shared decision making we switched his lisinopril to valsartan 160 mg daily.  Today he is accompanied by his friend. He reports that when he wakes up in the mornings his blood pressure is elevated. In the afternoons his blood pressure normalizes, and has never been low such as in the 100s systolic or less. Currently he is taking amlodipine and valsartan in the mornings, doxazosin at night. In the office his blood pressure is 146/74.   Once in a while he feels lightheaded, but he believes this is related to his metformin. His metformin has been ineffective and he plans to discontinue the medication. Also complains of feeling sluggish which he attributes to metformin.  Frequently exercises. He regularly completes 5,000 meters on a rower machine.  He denies any palpitations, chest pain, shortness of breath, peripheral edema, headaches, syncope, orthopnea,  or PND.  ROS:  Please see the history of present illness. ROS otherwise negative except as noted.  (+) Intermittent lightheadedness  Studies Reviewed: Marland Kitchen           Physical Exam:    VS:  BP (!) 146/74   Pulse 62   Ht 5\' 5"  (1.651 m)   Wt 186 lb 12.8 oz (84.7 kg)   SpO2 98%   BMI 31.09 kg/m    Wt Readings from Last 3 Encounters:  09/27/23 186 lb 12.8 oz (84.7 kg)  07/20/23 187 lb (84.8 kg)  05/16/23 190 lb (86.2 kg)    GEN: Well nourished, well developed in no acute distress HEENT: Normal, moist mucous membranes NECK: No JVD CARDIAC: regular rhythm, normal S1 and S2, no rubs or gallops. 2/6 systolic murmur. VASCULAR: Radial and DP pulses 2+ bilaterally. No carotid bruits RESPIRATORY:  Clear to auscultation without rales, wheezing or rhonchi  ABDOMEN: Soft, non-tender, non-distended MUSCULOSKELETAL:  Ambulates independently SKIN: Warm and dry, no edema NEUROLOGIC:  Alert and oriented x 3. No focal neuro deficits noted. PSYCHIATRIC:  Normal affect   ASSESSMENT AND PLAN: .    Syncope/unclear reduced level of consciousness -atypical pattern for syncope. Reports gradual neuro symptoms prior, then during several minutes of unresponsiveness, did not completely lose muscle tone.  -echo, monitor without findings that could be etiology of syncope -CT angio neck with moderate right intradural vertebral artery stenosis -EEG without seizure -no recurrence. Discussed aspirin vs. Clopidogrel today. He is amenable to changing to aspirin 81 mg, will monitor for bleeding   History of reduced LVEF LBBB Type II diabetes Previously seen by Dr. Darrold Junker, last in 10/2021. Noted to have history of incidental coronary atherosclerosis on CT, LBBB. Echo in 2019 showed EF 35-40%. Stress echo in 2017 was normal at rest and with stress.   He has not had cardiac cath. EF has improved to 50=55%.   Reviewed GDMT today. With elevated BP, discussed changing ARB to entresto. Also discussed SGLT2i. He  would prefer GLP1; I did discuss that there is more data with SGLT2i for cardiomyopathy, but as his EF has improved to 50-55% and this is his preference, reasonable to change metformin to Ozempic. Ordered today. Reviewed side effects. No history of pancreatitis or medullary thyroid cancer   Hypertension -on amlodipine, doxazosin, valsartan -discussed entresto, SGLT2i as above -given start of Ozempic, will increase doxazosin for now as it is his AM blood pressures that are high. At follow up, aim to optimize GDMT   Hyperlipidemia -currently on rosuvastatin, fenofibrate -last LDL 60, TG 102  Dispo: Follow-up in 3 months, or sooner as needed.  I,Mathew Stumpf,acting as a Neurosurgeon for [employer], MD.,have documented all relevant documentation on the behalf of Jodelle Red, MD,as directed by  Jodelle Red, MD while in the presence of Jodelle Red, MD.  I, Jodelle Red, MD, have reviewed all documentation for this visit. The documentation on 09/27/23 for the exam, diagnosis, procedures, and orders are all accurate and complete.   Signed, Jodelle Red, MD

## 2023-12-31 ENCOUNTER — Ambulatory Visit (INDEPENDENT_AMBULATORY_CARE_PROVIDER_SITE_OTHER): Payer: Medicare Other | Admitting: Cardiology

## 2023-12-31 ENCOUNTER — Encounter (HOSPITAL_BASED_OUTPATIENT_CLINIC_OR_DEPARTMENT_OTHER): Payer: Self-pay | Admitting: Cardiology

## 2023-12-31 VITALS — BP 140/72 | HR 65 | Ht 65.0 in | Wt 187.1 lb

## 2023-12-31 DIAGNOSIS — E7800 Pure hypercholesterolemia, unspecified: Secondary | ICD-10-CM | POA: Diagnosis not present

## 2023-12-31 DIAGNOSIS — E119 Type 2 diabetes mellitus without complications: Secondary | ICD-10-CM

## 2023-12-31 DIAGNOSIS — I10 Essential (primary) hypertension: Secondary | ICD-10-CM | POA: Diagnosis not present

## 2023-12-31 DIAGNOSIS — I519 Heart disease, unspecified: Secondary | ICD-10-CM

## 2023-12-31 DIAGNOSIS — Z87898 Personal history of other specified conditions: Secondary | ICD-10-CM | POA: Insufficient documentation

## 2023-12-31 MED ORDER — SEMAGLUTIDE (1 MG/DOSE) 4 MG/3ML ~~LOC~~ SOPN: 1.0000 mg | PEN_INJECTOR | SUBCUTANEOUS | 3 refills | Status: DC

## 2023-12-31 NOTE — Patient Instructions (Addendum)
Medication Instructions:  Your physician has recommended you make the following change in your medication:   START Ozempic 1 mg  Send me a message once you have been on the higher 1 mg dose for 4-6 weeks and let me know how it is going. We can increase the dose if we need to.  *If you need a refill on your cardiac medications before your next appointment, please call your pharmacy*  Follow-Up: At [facility], you and your health needs are our priority.  As part of our continuing mission to provide you with exceptional heart care, we have created designated Provider Care Teams.  These Care Teams include your primary Cardiologist (physician) and Advanced Practice Providers (APPs -  Physician Assistants and Nurse Practitioners) who all work together to provide you with the care you need, when you need it.  We recommend signing up for the patient portal called "MyChart".  Sign up information is provided on this After Visit Summary.  MyChart is used to connect with patients for Virtual Visits (Telemedicine).  Patients are able to view lab/test results, encounter notes, upcoming appointments, etc.  Non-urgent messages can be sent to your provider as well.   To learn more about what you can do with MyChart, go to [email or web address].    Your next appointment:   3 month(s)  Provider:   Jodelle Red, MD

## 2023-12-31 NOTE — Progress Notes (Signed)
Cardiology Office Note:  .    Date:  12/31/2023  ID:  [name], DOB 1951-01-28, MRN [id number] PCP: Andreas Blower., MD  [facility] Providers Cardiologist:  Jodelle Red, MD     History of Present Illness: .    Graves Nipp is a 73 y.o. male wwith a hx of HFrEF, TIA, hypertension, hyperlipidemia, type 2 diabetes, gout, obesity, here for follow-up today. He was initially seen 07/20/2023 for the evaluation of syncope and discussion of heart monitor results.   He was admitted to the hospital 04/17/2023 following a syncopal episode. Evaluated by neurology and cardiology; noted suspicions of seizures (left arm tremors and postictal confusion). Recommended to start Keppra and continue Plavix. Arranged for outpatient heart monitor given complaints of dizzy spells. He has followed up with neurology 05/2023 with Dr. Epimenio Foot. Likelihood of seizure felt to be small, so Keppra was discontinued. Continued Plavix and Crestor.    He wore a monitor 5-05/2023 with predominantly sinus rhythm. First Degree AV Block was present. Bundle Branch Block/IVCD was present. There were 2 runs of SVT, longest lasting 5 beats. Isolated ectopy was rare (<1.0%). He had an echo 04/2023 revealing LVEF 50-55%, dyskinetic apex without evidence of LV thrombus, mild LVH, grade 1 diastolic dysfunction. Trivial aortic valve regurgitation. Negative bubble study. Stress echo 2017 reportedly normal, Echo 2019 with reduced EF. He denies any prior heart catheterization.  Today: Overall doing well.Has occasional dizziness but no syncope, especially if he hasn't eaten. This has significantly improved since stopping the metformin.   Brings his labs from recent [place] visit, summarized below.  On semaglutide, tolerating. Has not lost much weight. Weighed 184 lbs at home, 187 lbs here. On 0.5 mg dose semaglutide, no side effects. Feels that appetite is much less but not losing weight.  ROS:  Denies chest pain, shortness of breath at  rest or with normal exertion. No PND, orthopnea, LE edema or unexpected weight gain. No syncope or palpitations. ROS otherwise negative except as noted.   Studies Reviewed: Marland Kitchen           Physical Exam:    VS:  BP (!) 142/68   Pulse 65   Ht 5\' 5"  (1.651 m)   Wt 187 lb 1.6 oz (84.9 kg)   SpO2 97%   BMI 31.14 kg/m    Wt Readings from Last 3 Encounters:  12/31/23 187 lb 1.6 oz (84.9 kg)  09/27/23 186 lb 12.8 oz (84.7 kg)  07/20/23 187 lb (84.8 kg)    GEN: Well nourished, well developed in no acute distress HEENT: Normal, moist mucous membranes NECK: No JVD CARDIAC: regular rhythm, normal S1 and S2, no rubs or gallops. 2/6 systolic murmur. VASCULAR: Radial and DP pulses 2+ bilaterally. No carotid bruits RESPIRATORY:  Clear to auscultation without rales, wheezing or rhonchi  ABDOMEN: Soft, non-tender, non-distended MUSCULOSKELETAL:  Ambulates independently SKIN: Warm and dry, no edema NEUROLOGIC:  Alert and oriented x 3. No focal neuro deficits noted. PSYCHIATRIC:  Normal affect   ASSESSMENT AND PLAN: .     History of reduced LVEF LBBB Type II diabetes - history of incidental coronary atherosclerosis on CT (no cath), LBBB. Echo in 2019 showed EF 35-40%. Stress echo in 2017 was normal at rest and with stress. Most recent EF 50-55% 04/2023. -BP above goal. We have discussed options, including entresto, SGLT2i. Discussed options today. After shared decision making, he wants to work on weight loss and re-evaluate in 3 months -on 0.5 mg semaglutide, no side effects  but no significant weight loss. Increase to 1 mg weekly dose today -A1c 6.8 at [facility] 11/14/23   Hypertension -on amlodipine, doxazosin, valsartan -discussed entresto, SGLT2i as above -above goal today. Discussed options. Wants to focus on weight loss and re-evaluate in 3 mos -Cr 1.41, K 3.7 at [facility] 11/2023  Hyperlipidemia Carotid atherosclerosis (by CT) without significant stenosis -currently on rosuvastatin,  fenofibrate -labs from [facility] 11/14/23: Tchol 135, HDL 48, LDL 66, TG 130  History of syncope/unclear reduced level of consciousness -atypical pattern for syncope. Reports gradual neuro symptoms prior, then during several minutes of unresponsiveness, did not completely lose muscle tone.  -echo, monitor without findings that could be etiology of syncope -CT angio neck with moderate right intradural vertebral artery stenosis -EEG without seizure -no recurrence. Discussed aspirin vs. Clopidogrel today. He is amenable to changing to aspirin 81 mg, will monitor for bleeding  Dispo: Follow-up in 3 months, or sooner as needed.  Signed, Jodelle Red, MD

## 2024-01-28 ENCOUNTER — Telehealth (HOSPITAL_BASED_OUTPATIENT_CLINIC_OR_DEPARTMENT_OTHER): Payer: Self-pay

## 2024-01-28 MED ORDER — OZEMPIC (2 MG/DOSE) 8 MG/3ML ~~LOC~~ SOPN: 2.0000 mg | PEN_INJECTOR | SUBCUTANEOUS | 11 refills | Status: DC

## 2024-01-28 NOTE — Telephone Encounter (Signed)
 Called and spoke to patient. Pt feels OK on new dose of Ozempic (1 mg) Pt started on 1/27. Pt willing to increase dose. Will forward to pharmacy team for refill. He verbalized understanding.

## 2024-04-09 ENCOUNTER — Ambulatory Visit (INDEPENDENT_AMBULATORY_CARE_PROVIDER_SITE_OTHER): Payer: Medicare Other | Admitting: Cardiology

## 2024-04-09 ENCOUNTER — Encounter (HOSPITAL_BASED_OUTPATIENT_CLINIC_OR_DEPARTMENT_OTHER): Payer: Self-pay | Admitting: Cardiology

## 2024-04-09 VITALS — BP 128/76 | HR 64 | Ht 65.0 in | Wt 169.4 lb

## 2024-04-09 DIAGNOSIS — I519 Heart disease, unspecified: Secondary | ICD-10-CM

## 2024-04-09 DIAGNOSIS — E7800 Pure hypercholesterolemia, unspecified: Secondary | ICD-10-CM | POA: Diagnosis not present

## 2024-04-09 DIAGNOSIS — Z87898 Personal history of other specified conditions: Secondary | ICD-10-CM

## 2024-04-09 DIAGNOSIS — E119 Type 2 diabetes mellitus without complications: Secondary | ICD-10-CM

## 2024-04-09 DIAGNOSIS — I10 Essential (primary) hypertension: Secondary | ICD-10-CM

## 2024-04-09 MED ORDER — AMLODIPINE BESYLATE 5 MG PO TABS: 5.0000 mg | ORAL_TABLET | Freq: Every day | ORAL | 3 refills | Status: DC

## 2024-04-09 NOTE — Patient Instructions (Addendum)
 Medication Instructions:  Your physician has recommended you make the following change in your medication:   STOP doxazosin  DECREASE amlodipine from 10 mg to 5 mg daily. Goal blood pressures are 110s-120s on the top. Bottom numbers are secondary, but should also go up some with changing meds. Make sure you stay hydrated. If the blood pressure is still <110 on the top or <50 on the bottom with the 5 mg amlodipine dose after a few weeks, stop the amlodipine entirely. Then monitor blood pressure. Contact me if consistently >130/80  I would like you to keep your weight stable or at weight loss of 0.5 lbs/week or less. If you are still dropping weight quickly, I think we should cut back the Ozempic  dose. Let me know if this is an issue before your next visit.  Follow-Up: Please follow up in 3 months with Dr. Veryl Gottron, Slater Duncan, NP or Neomi Banks, NP

## 2024-04-09 NOTE — Progress Notes (Signed)
 Cardiology Office Note:  .    Date:  04/09/2024  ID:  Durrel Ostrom, DOB August 31, 1951, MRN [id number] PCP: Lavenia Post., MD  [facility] Providers Cardiologist:  Sheryle Donning, MD     History of Present Illness: .    [name] is a 73 y.o. male wwith a hx of HFrEF, TIA, hypertension, hyperlipidemia, type 2 diabetes, gout, obesity, here for follow-up today. He was initially seen 07/20/2023 for the evaluation of syncope and discussion of heart monitor results.   He was admitted to the hospital 04/17/2023 following a syncopal episode. Evaluated by neurology and cardiology; noted suspicions of seizures (left arm tremors and postictal confusion). Recommended to start Keppra  and continue Plavix . Arranged for outpatient heart monitor given complaints of dizzy spells. He has followed up with neurology 05/2023 with Dr. Godwin Lat. Likelihood of seizure felt to be small, so Keppra  was discontinued. Continued Plavix  and Crestor .    He wore a monitor 5-05/2023 with predominantly sinus rhythm. First Degree AV Block was present. Bundle Branch Block/IVCD was present. There were 2 runs of SVT, longest lasting 5 beats. Isolated ectopy was rare (<1.0%). He had an echo 04/2023 revealing LVEF 50-55%, dyskinetic apex without evidence of LV thrombus, mild LVH, grade 1 diastolic dysfunction. Trivial aortic valve regurgitation. Negative bubble study. Stress echo 2017 reportedly normal, Echo 2019 with reduced EF. He denies any prior heart catheterization.  Today: Overall doing well, except for periodic low blood pressures. Brings logs today. Some BP as low as 94/44, 92/48, highest 125/60. Feels very dizzy with these low numbers. Has lost 18 lbs, discussed how this affects BP.   Currently taking amlodipine 10 mg daily, valsartan  160 mg daly. Stopped doxazosin  2 mg at bedtime, but this hasn't changed his BP.  ROS:  Denies chest pain, shortness of breath at rest or with normal exertion. No PND, orthopnea, LE edema or  unexpected weight gain. No syncope or palpitations. ROS otherwise negative except as noted.   Studies Reviewed: Aaron Aas    EKG Interpretation Date/Time:  Wednesday Apr 09 2024 10:06:49 EDT Ventricular Rate:  61 PR Interval:  186 QRS Duration:  160 QT Interval:  432 QTC Calculation: 434 R Axis:   49  Text Interpretation: Normal sinus rhythm Left bundle branch block Confirmed by Sheryle Donning [phone] on 04/09/2024 10:35:07 AM      Physical Exam:    VS:  BP 128/76   Pulse 64   Ht 5\' 5"  (1.651 m)   Wt 169 lb 6.4 oz (76.8 kg)   SpO2 99%   BMI 28.19 kg/m    Wt Readings from Last 3 Encounters:  04/09/24 169 lb 6.4 oz (76.8 kg)  12/31/23 187 lb 1.6 oz (84.9 kg)  09/27/23 186 lb 12.8 oz (84.7 kg)    GEN: Well nourished, well developed in no acute distress HEENT: Normal, moist mucous membranes NECK: No JVD CARDIAC: regular rhythm, normal S1 and S2, no rubs or gallops. 2/6 systolic murmur. VASCULAR: Radial and DP pulses 2+ bilaterally. No carotid bruits RESPIRATORY:  Clear to auscultation without rales, wheezing or rhonchi  ABDOMEN: Soft, non-tender, non-distended MUSCULOSKELETAL:  Ambulates independently SKIN: Warm and dry, no edema NEUROLOGIC:  Alert and oriented x 3. No focal neuro deficits noted. PSYCHIATRIC:  Normal affect   ASSESSMENT AND PLAN: .     History of reduced LVEF LBBB Type II diabetes - history of incidental coronary atherosclerosis on CT (no cath), LBBB. Echo in 2019 showed EF 35-40%. Stress echo in 2017 was normal  at rest and with stress. Most recent EF 50-55% 04/2023. -BP running low, see adjustment below -NYHA class 1 -has lost significant weight, down almost 20 lbs. BMI 28. Muscular build. I would say he is at or near his ideal weight based on his body habitus, on Ozempic  2 mg/week. Has lost more with diet changes and exercise. If he continues to lose weight, would scale back to 1 mg/week dose   Hypertension -on amlodipine, doxazosin , valsartan . BP now  running low.  -today we stopped doxazosin , decreasing amlodipine, see patient instructions -he will contact me with persistent low BP readings  Hyperlipidemia Carotid atherosclerosis (by CT) without significant stenosis -currently on rosuvastatin , fenofibrate  -labs from [facility] 11/14/23: Tchol 135, HDL 48, LDL 66, TG 130  History of syncope/unclear reduced level of consciousness -atypical pattern for syncope. Reports gradual neuro symptoms prior, then during several minutes of unresponsiveness, did not completely lose muscle tone.  -echo, monitor without findings that could be etiology of syncope -CT angio neck with moderate right intradural vertebral artery stenosis -EEG without seizure -no recurrence. Discussed aspirin  vs. Clopidogrel  today. He is amenable to changing to aspirin  81 mg, will monitor for bleeding  Dispo: Follow-up in 3 months, or sooner as needed.  Signed, Sheryle Donning, MD

## 2024-05-01 ENCOUNTER — Encounter (HOSPITAL_BASED_OUTPATIENT_CLINIC_OR_DEPARTMENT_OTHER): Payer: Self-pay

## 2024-06-03 ENCOUNTER — Other Ambulatory Visit: Payer: Self-pay

## 2024-06-03 ENCOUNTER — Encounter (HOSPITAL_BASED_OUTPATIENT_CLINIC_OR_DEPARTMENT_OTHER): Payer: Self-pay

## 2024-06-03 DIAGNOSIS — I5042 Chronic combined systolic (congestive) and diastolic (congestive) heart failure: Secondary | ICD-10-CM

## 2024-06-03 MED ORDER — VALSARTAN 160 MG PO TABS: 160.0000 mg | ORAL_TABLET | Freq: Every day | ORAL | 3 refills | Status: DC

## 2024-07-17 ENCOUNTER — Other Ambulatory Visit (HOSPITAL_BASED_OUTPATIENT_CLINIC_OR_DEPARTMENT_OTHER): Payer: Self-pay | Admitting: Cardiology

## 2024-07-17 DIAGNOSIS — I10 Essential (primary) hypertension: Secondary | ICD-10-CM

## 2024-07-29 ENCOUNTER — Encounter (HOSPITAL_BASED_OUTPATIENT_CLINIC_OR_DEPARTMENT_OTHER): Payer: Self-pay | Admitting: Cardiology

## 2024-07-29 ENCOUNTER — Ambulatory Visit (HOSPITAL_BASED_OUTPATIENT_CLINIC_OR_DEPARTMENT_OTHER): Admitting: Cardiology

## 2024-07-29 VITALS — BP 120/80 | HR 60 | Ht 65.0 in | Wt 164.4 lb

## 2024-07-29 DIAGNOSIS — I952 Hypotension due to drugs: Secondary | ICD-10-CM | POA: Diagnosis not present

## 2024-07-29 DIAGNOSIS — I10 Essential (primary) hypertension: Secondary | ICD-10-CM

## 2024-07-29 DIAGNOSIS — Z8679 Personal history of other diseases of the circulatory system: Secondary | ICD-10-CM | POA: Diagnosis not present

## 2024-07-29 DIAGNOSIS — E7800 Pure hypercholesterolemia, unspecified: Secondary | ICD-10-CM

## 2024-07-29 DIAGNOSIS — I6529 Occlusion and stenosis of unspecified carotid artery: Secondary | ICD-10-CM

## 2024-07-29 MED ORDER — VALSARTAN 40 MG PO TABS
40.0000 mg | ORAL_TABLET | Freq: Every day | ORAL | 11 refills | Status: AC
Start: 2024-07-29 — End: ?

## 2024-07-29 NOTE — Patient Instructions (Addendum)
 Stop amlodipine  completely We will cut back on the valsartan  from 160 mg to 40 mg daily  Please keep checking blood pressure, once a day is ok, also can check if you are feeling very poorly.  If after changes your blood pressure is consistently more than 140/90, let me know and we will increase the valsartan  to 80 mg.  If after changes your blood pressure is frequently in the 90s on the top number, or you are still having dizziness, message me and we may stop the valsartan  completely.  FOLLOW UP IN 2 TO 3 MONTHS WIT DR CHRISTOPHER, CAITLIN W NP, OR MICHELLE S NP

## 2024-07-29 NOTE — Progress Notes (Signed)
 Cardiology Office Note:  .    Date:  07/29/2024  ID:  [name], DOB 06-06-1951, MRN [id number] PCP: Delilah Murray HERO., MD  [facility] Providers Cardiologist:  Shelda Bruckner, MD     History of Present Illness: .    [name] is a 73 y.o. male with a hx of HFrEF with then recovered EF, TIA, hypertension, hyperlipidemia, type 2 diabetes, gout, obesity, here for follow-up today. He was initially seen 07/20/2023 for the evaluation of syncope and discussion of heart monitor results.   He was admitted to the hospital 04/17/2023 following a syncopal episode. Evaluated by neurology and cardiology; noted suspicions of seizures (left arm tremors and postictal confusion). Recommended to start Keppra  and continue Plavix . Arranged for outpatient heart monitor given complaints of dizzy spells. He has followed up with neurology 05/2023 with Dr. Vear. Likelihood of seizure felt to be small, so Keppra  was discontinued. Continued Plavix  and Crestor .    He wore a monitor 5-05/2023 with predominantly sinus rhythm. First Degree AV Block was present. Bundle Branch Block/IVCD was present. There were 2 runs of SVT, longest lasting 5 beats. Isolated ectopy was rare (<1.0%). He had an echo 04/2023 revealing LVEF 50-55%, dyskinetic apex without evidence of LV thrombus, mild LVH, grade 1 diastolic dysfunction. Trivial aortic valve regurgitation. Negative bubble study. Stress echo 2017 reportedly normal, Echo 2019 with reduced EF. He denies any prior heart catheterization.  Today: Has had intermittent dizziness. Has been checking BP after working out, which is when it was lowest. Brings BP log, BP has been largely 90s/60s, lowest 81/55.   At last visit, we stopped the doxazosin  and decreased amlodipine  from 10 mg to 5 mg. Discussed what we will stop today.   Remains very active when he is not lightheaded.  ROS:  Denies chest pain, shortness of breath at rest or with normal exertion. No PND, orthopnea, LE  edema or unexpected weight gain. No syncope or palpitations. ROS otherwise negative except as noted.   Studies Reviewed: SABRA           Physical Exam:    VS:  BP 120/80   Pulse 60   Ht 5' 5 (1.651 m)   Wt 164 lb 6.4 oz (74.6 kg)   SpO2 99%   BMI 27.36 kg/m    Wt Readings from Last 3 Encounters:  07/29/24 164 lb 6.4 oz (74.6 kg)  04/09/24 169 lb 6.4 oz (76.8 kg)  12/31/23 187 lb 1.6 oz (84.9 kg)    GEN: Well nourished, well developed in no acute distress HEENT: Normal, moist mucous membranes NECK: No JVD CARDIAC: regular rhythm, normal S1 and S2, no rubs or gallops. 2/6 systolic murmur. VASCULAR: Radial and DP pulses 2+ bilaterally. No carotid bruits RESPIRATORY:  Clear to auscultation without rales, wheezing or rhonchi  ABDOMEN: Soft, non-tender, non-distended MUSCULOSKELETAL:  Ambulates independently SKIN: Warm and dry, no edema NEUROLOGIC:  Alert and oriented x 3. No focal neuro deficits noted. PSYCHIATRIC:  Normal affect   ASSESSMENT AND PLAN: .     History of reduced LVEF, then recovered LBBB Type II diabetes - history of incidental coronary atherosclerosis on CT (no cath), LBBB. Echo in 2019 showed EF 35-40%. Stress echo in 2017 was normal at rest and with stress. Most recent EF 50-55% 04/2023. -BP running low, see adjustment below. No room for entresto at all, having to cut back on ARB but will try to keep if BP allows. -NYHA class 1 -tolerating semaglutide . I think he is  at or near his ideal body weight. BMI is 27 but he carries muscle. If he has not plateaued on his own at follow up, I would consider dropping semaglutide  dose to prevent further weight loss.   Hypertension: now running significantly low since losing weight -on amlodipine  5 mg daily, valsartan  160 mg daily. BP running lower and he is lightheaded -stopping amlodipine  completely. Decreasing valsartan  from 160 to 40 mg daily. With prior low EF, would like to keep some ARB, but may not be able to if BP  remains low -previously stopped doxazosin   -given the following instructions: Stop amlodipine  completely We will cut back on the valsartan  from 160 mg to 40 mg daily  Please keep checking blood pressure, once a day is ok, also can check if you are feeling very poorly.  If after changes your blood pressure is consistently more than 140/90, let me know and we will increase the valsartan  to 80 mg.  If after changes your blood pressure is frequently in the 90s on the top number, or you are still having dizziness, message me and we may stop the valsartan  completely.  Hyperlipidemia Carotid atherosclerosis (by CT) without significant stenosis -currently on rosuvastatin , fenofibrate  -labs from [facility] 11/14/23: Tchol 135, HDL 48, LDL 66, TG 130  History of syncope/unclear reduced level of consciousness -atypical pattern for syncope. Reports gradual neuro symptoms prior, then during several minutes of unresponsiveness, did not completely lose muscle tone.  -echo, monitor without findings that could be etiology of syncope -CT angio neck with moderate right intradural vertebral artery stenosis -EEG without seizure -no recurrence. Discussed aspirin  vs. Clopidogrel  today. He is amenable to changing to aspirin  81 mg, will monitor for bleeding  Dispo: Follow-up in 2-3 months, or sooner as needed.  Signed, Shelda Bruckner, MD

## 2024-08-30 ENCOUNTER — Other Ambulatory Visit (HOSPITAL_BASED_OUTPATIENT_CLINIC_OR_DEPARTMENT_OTHER): Payer: Self-pay | Admitting: Cardiology

## 2024-08-30 DIAGNOSIS — I5042 Chronic combined systolic (congestive) and diastolic (congestive) heart failure: Secondary | ICD-10-CM

## 2024-10-23 ENCOUNTER — Ambulatory Visit (INDEPENDENT_AMBULATORY_CARE_PROVIDER_SITE_OTHER): Admitting: Cardiology

## 2024-10-23 ENCOUNTER — Encounter (HOSPITAL_BASED_OUTPATIENT_CLINIC_OR_DEPARTMENT_OTHER): Payer: Self-pay | Admitting: Cardiology

## 2024-10-23 VITALS — BP 130/76 | HR 60 | Ht 65.0 in | Wt 164.2 lb

## 2024-10-23 DIAGNOSIS — I5020 Unspecified systolic (congestive) heart failure: Secondary | ICD-10-CM | POA: Diagnosis not present

## 2024-10-23 DIAGNOSIS — E782 Mixed hyperlipidemia: Secondary | ICD-10-CM

## 2024-10-23 DIAGNOSIS — E119 Type 2 diabetes mellitus without complications: Secondary | ICD-10-CM

## 2024-10-23 DIAGNOSIS — I10 Essential (primary) hypertension: Secondary | ICD-10-CM | POA: Diagnosis not present

## 2024-10-23 DIAGNOSIS — I2510 Atherosclerotic heart disease of native coronary artery without angina pectoris: Secondary | ICD-10-CM

## 2024-10-23 NOTE — Patient Instructions (Addendum)
 Would like LDL <55, TG <150.  Plan: will stop fenofibrate , recheck fasting lipids in about 3 mos. If TG not at goal, can discuss either adding fibrate back or adding vascepa based on numbers. But if TG ok and LDL not at goal, we discussed adding ezetimibe (if close to goal) or PCKS9i if significant distance from goal.  ____________________________________________   About a week prior to next visit (which is due in March 2026) - get fasting blood work --Fasting Lipid Panel and Lp(a)  4 month follow up with Dr. Lonni ----02/19/25 at 3:20 pm

## 2024-10-23 NOTE — Progress Notes (Signed)
 Cardiology Office Note:  .    Date:  10/23/2024  ID:  [name], DOB March 25, 1951, MRN [id number] PCP: Delilah Murray HERO., MD  [facility] Providers Cardiologist:  Shelda Bruckner, MD     History of Present Illness: .    [name] is a 73 y.o. male with a hx of HFrEF with then recovered EF, TIA, hypertension, hyperlipidemia, type 2 diabetes, gout, obesity, here for follow-up today. He was initially seen 07/20/2023 for the evaluation of syncope and discussion of heart monitor results.   He was admitted to the hospital 04/17/2023 following a syncopal episode. Evaluated by neurology and cardiology; noted suspicions of seizures (left arm tremors and postictal confusion). Recommended to start Keppra  and continue Plavix . Arranged for outpatient heart monitor given complaints of dizzy spells. He has followed up with neurology 05/2023 with Dr. Vear. Likelihood of seizure felt to be small, so Keppra  was discontinued. Continued Plavix  and Crestor .    He wore a monitor 5-05/2023 with predominantly sinus rhythm. First Degree AV Block was present. Bundle Branch Block/IVCD was present. There were 2 runs of SVT, longest lasting 5 beats. Isolated ectopy was rare (<1.0%). He had an echo 04/2023 revealing LVEF 50-55%, dyskinetic apex without evidence of LV thrombus, mild LVH, grade 1 diastolic dysfunction. Trivial aortic valve regurgitation. Negative bubble study. Stress echo 2017 reportedly normal, Echo 2019 with reduced EF. He denies any prior heart catheterization.  Today: Just had surgery on R hand for trigger finger. Hasn't been able to exercise, blood pressure is up a bit from pain today.   At home, weight 161.7 lbs, has been stable for him. Lowest has been 159 lbs.   Hadn't check BP in a few weeks. BP was doing well on the 40 mg valsartan , has not been running low.  We reviewed his cholesterol medications and options, see below.  Has had one episode since last visit when he was reaching to get  something off the shelf in the grocery store, turned his head, grab his neck, and he became a little dizzy/woozy, no full consciousness. He was told by his neurologist that his prior episode may have been due to positional narrowing of a vessel in his neck.  ROS:  Denies chest pain, shortness of breath at rest or with normal exertion. No PND, orthopnea, LE edema or unexpected weight gain. No syncope or palpitations. ROS otherwise negative except as noted.   Studies Reviewed: SABRA           Physical Exam:    VS:  BP 130/76 (Cuff Size: Normal)   Pulse 60   Ht 5' 5 (1.651 m)   Wt 164 lb 3.2 oz (74.5 kg)   SpO2 99%   BMI 27.32 kg/m    Wt Readings from Last 3 Encounters:  10/23/24 164 lb 3.2 oz (74.5 kg)  07/29/24 164 lb 6.4 oz (74.6 kg)  04/09/24 169 lb 6.4 oz (76.8 kg)    GEN: Well nourished, well developed in no acute distress HEENT: Normal, moist mucous membranes NECK: No JVD CARDIAC: regular rhythm, normal S1 and S2, no rubs or gallops. 2/6 systolic murmur. VASCULAR: Radial and DP pulses 2+ bilaterally. No carotid bruits RESPIRATORY:  Clear to auscultation without rales, wheezing or rhonchi  ABDOMEN: Soft, non-tender, non-distended MUSCULOSKELETAL:  Ambulates independently SKIN: Warm and dry, no edema NEUROLOGIC:  Alert and oriented x 3. No focal neuro deficits noted. PSYCHIATRIC:  Normal affect   ASSESSMENT AND PLAN: .     History of reduced  LVEF, then recovered LBBB Type II diabetes - Echo in 2019 showed EF 35-40%. Stress echo in 2017 was normal at rest and with stress. Most recent EF 50-55% 04/2023. -hypotension has limited GDMT, see history below -NYHA class 1 -tolerating semaglutide . I think he is at or near his ideal body weight. BMI is 27 but he carries muscle. He has plateaued, ok to continue current dose but reminded him about eating/hydration. If he starts dropping weight again would cut the dose.    Hypertension: now running significantly low since losing  weight -04/2024 stopped doxazosin , decreased amlodipine  from 10 mg to 5 mg daily -07/2024 stopped amlodipine , decreased valsartan  from 160 mg to 40 mg daily -up today given recent surgery/pain, follows at home, well controlled.   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 Confirmed he is on 40 mg rosuvastatin . Discussed fenofibrate , TG goals. Would like his LDL <55, TG <150. We discussed options. After shared decision making, will stop fenofibrate , recheck lipids in about 3 mos. If TG not at goal, can discuss either adding fibrate back or adding vascepa based on numbers. But if TG ok and LDL not at goal, we discussed adding ezetimibe (if close to goal) or PCKS9i if significant distance from goal. -would not use bempedoic acid given history of gout -labs from [facility] 11/14/23: Tchol 135, HDL 48, LDL 66, TG 130 -labs from [facility]: Tchol 139, HDL 52, LDL 73, TG 65 -continue aspirin  -discussed lipoprotein a, has not been checked, he is amenable  History of syncope/unclear reduced level of consciousness -atypical pattern for syncope. Reports gradual neuro symptoms prior, then during several minutes of unresponsiveness, did not completely lose muscle tone.  -echo, monitor without findings that could be etiology of syncope -CT angio neck with moderate right intradural vertebral artery stenosis -EEG without seizure -no recurrence. Discussed aspirin  vs. Clopidogrel  today. He is amenable to changing to aspirin  81 mg, will monitor for bleeding  Dispo: Follow-up in 3-4 months, or sooner as needed. Fasting lipids 1 week prior  Signed, Shelda Bruckner, MD

## 2024-12-15 ENCOUNTER — Other Ambulatory Visit (HOSPITAL_BASED_OUTPATIENT_CLINIC_OR_DEPARTMENT_OTHER): Payer: Self-pay | Admitting: Cardiology

## 2025-02-19 ENCOUNTER — Ambulatory Visit (HOSPITAL_BASED_OUTPATIENT_CLINIC_OR_DEPARTMENT_OTHER): Admitting: Cardiology
